# Patient Record
Sex: Female | Born: 1990 | Race: Black or African American | Hispanic: No | Marital: Single | Smoking: Current every day smoker
Health system: Southern US, Community
[De-identification: ages and names within clinical notes are randomized; demographics above are authoritative.]

## PROBLEM LIST (undated history)

## (undated) ENCOUNTER — Inpatient Hospital Stay (HOSPITAL_COMMUNITY): Payer: Self-pay

## (undated) DIAGNOSIS — R51 Headache: Secondary | ICD-10-CM

## (undated) DIAGNOSIS — K219 Gastro-esophageal reflux disease without esophagitis: Secondary | ICD-10-CM

## (undated) DIAGNOSIS — G35 Multiple sclerosis: Secondary | ICD-10-CM

## (undated) DIAGNOSIS — R519 Headache, unspecified: Secondary | ICD-10-CM

## (undated) HISTORY — PX: ADENOIDECTOMY: SUR15

## (undated) HISTORY — PX: TONSILLECTOMY: SUR1361

---
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
## 2011-02-17 LAB — SYPHILIS: RPR W/REFLEX TO RPR TITER AND TREPONEMAL ANTIBODIES, TRADITIONAL SCREENING AND DIAGNOSIS ALGORITHM: RPR Ser Ql: NONREACTIVE

## 2011-02-17 LAB — CBC
HCT: 29.4 % — ABNORMAL LOW (ref 36.0–46.0)
Hemoglobin: 10 g/dL — ABNORMAL LOW (ref 12.0–15.0)
MCHC: 33.9 g/dL (ref 30.0–36.0)
MCV: 90.3 fL (ref 78.0–100.0)
MCV: 91.4 fL (ref 78.0–100.0)
Platelets: 204 10*3/uL (ref 150–400)
Platelets: 256 10*3/uL (ref 150–400)
RBC: 3.22 MIL/uL — ABNORMAL LOW (ref 3.87–5.11)
RBC: 3.71 MIL/uL — ABNORMAL LOW (ref 3.87–5.11)
RDW: 13.3 % (ref 11.5–15.5)
WBC: 15.1 10*3/uL — ABNORMAL HIGH (ref 4.0–10.5)
WBC: 16 10*3/uL — ABNORMAL HIGH (ref 4.0–10.5)

## 2011-03-27 NOTE — Discharge Summary (Signed)
NAMESHARONDA, [name] NO.:  [id number]   MEDICAL RECORD NO.:  [id number]          PATIENT TYPE:  INP   LOCATION:  9127                          FACILITY:  [facility]   PHYSICIAN:  Freddy Finner, M.D.   DATE OF BIRTH:  1991-05-02   DATE OF ADMISSION:  07/11/2009  DATE OF DISCHARGE:  07/14/2009                               DISCHARGE SUMMARY   ADMITTING DIAGNOSES:  1. Intrauterine pregnancy at 37-6/7 weeks estimated gestational age.  2. Spontaneous onset of labor.   DISCHARGE DIAGNOSES:  1. Status post low transverse cesarean section secondary to      nonreassuring fetal heart tones.  2. Viable female infant.   PROCEDURE:  Primary low transverse cesarean section.   REASON FOR ADMISSION:  Please see written H and P.   HOSPITAL COURSE:  The patient is an 20 year old African American single  black female, primigravida, who was admitted to [facility] with spontaneous onset of labor at 37-6/7 weeks estimated  gestational age.  Artificial rupture of membranes was performed which  revealed clear fluid.  Fetal heart tones were in the 160s with  acceleration with a questionable deceleration.  Intrauterine pressure  catheter was inserted and fetal scalp electrode was placed.  Initially,  the fetal heart tones were reactive; however, shortly thereafter fetal  heart tones were noted to have some decelerations down into the 50s with  contractions.  There was some recovery in between.  Cervix was  reexamined and found to be unchanged.  Decision was changed.  IV fluids  given.  Oxygen administration was administered.  Due to persistent fetal  heart rate decelerations and failure to progress, decision was made to  proceed with a primary low transverse cesarean section.  The patient was  now taken to the operating room where spinal anesthesia was administered  without difficulty.  A low transverse incision was made with delivery of  a viable female infant with Apgars  of 6 at 1 minute and 9 at 5 minutes.  Arterial cord pH was 7.18.  The patient tolerated the procedure well and  was taken to the recovery room in stable condition.  On postoperative  day #1, the patient was without complaint.  Vital signs were stable.  She was afebrile.  Abdomen was slightly distended with some decrease in  bowel sounds.  Fundus was firm, nontender.  Abdominal dressing to have a  small amount of drainage noted on the bandage.  Foley had been  discontinued and she is voiding well.  Laboratory findings showed  hemoglobin of 10.0, platelet count of 204,000, blood type was known to  be O+.  On postoperative day #2, the patient was without complaint.  Vital signs were stable.  She is afebrile.  Fundus firm and nontender.  Incision was clean, dry and intact.  She is ambulating well.  On  postoperative day 3, the patient was without complaint.  Vital signs  remained stable.  She was afebrile.  Fundus firm and nontender.  Incision was clean, dry and intact.  Staples removed.  Discharge  instructions were reviewed and the patient  was later discharged home.   CONDITION ON DISCHARGE:  Stable.   DIET:  Regular as tolerated.   ACTIVITY:  No heavy lifting, no driving x2 weeks, no vaginal entry.   FOLLOW UP:  The patient is to follow up in the office in 1-2 weeks for  an incision check.  She is to call for temperature greater than 100  degrees, persistent nausea, vomiting, heavy vaginal bleeding and/or  redness or drainage from the incisional site.   DISCHARGE MEDICATIONS:  1. Tylox #30 one p.o. every 4-6 hours p.r.n.  2. Motrin 600 mg every 6 hours.  3. Prenatal vitamins 1 p.o. daily.      Julio Sicks, N.P.      Freddy Finner, M.D.  Electronically Signed    CC/MEDQ  D:  07/14/2009  T:  07/14/2009  Job:  [id number]

## 2011-03-27 NOTE — Op Note (Signed)
[name], [name]             ACCOUNT NO.:  [id number]   MEDICAL RECORD NO.:  [id number]          PATIENT TYPE:  INP   LOCATION:  9127                          FACILITY:  [facility]   PHYSICIAN:  Guy Sandifer. Henderson Cloud, M.D. DATE OF BIRTH:  Jun 29, 1991   DATE OF PROCEDURE:  07/11/2009  DATE OF DISCHARGE:                               OPERATIVE REPORT   PREOPERATIVE DIAGNOSES:  1. Intrauterine pregnancy at 37-6/7 weeks estimated gestational age.  2. Nonreassuring fetal heart tones.   POSTOPERATIVE DIAGNOSES:  1. Intrauterine pregnancy at 37-6/7 weeks estimated gestational age.  2. Nonreassuring fetal heart tones.   PROCEDURE:  Low-transverse cesarean section.   SURGEON:  Guy Sandifer. Henderson Cloud, MD   ANESTHESIA:  Spinal, Dr. Rodman Pickle.   SPECIMENS:  Placenta to pathology.   FINDINGS:  Viable female infant, Apgars of 6 and 9 at 1 and 5 minutes  respectively.  Arterial cord pH 7.18.   ESTIMATED BLOOD LOSS:  500 mL.   INDICATIONS AND CONSENT:  This patient is an 20 year old single black  female G1, P0 with an EDC of July 27, 2009.  Prenatal care has been  uncomplicated and group B strep culture is negative.  She presented to  hospital earlier in the day with contractions, but did not achieve  active labor and had a reactive fetal heart tracing.  She was discharged  home.  She returns with complaints of contractions.  At approximately  4:35 a.m., cervix was 7, complete, -1, vertex station.  Artificial  rupture of membranes for clear fluid is carried out.  Fetal heart tones  were 160s with excels and a question of some decels.  IUPC and fetal  scalp electrode were placed.  Fetal heart tones were reactive.  The  fetal heart tones then began having decelerations to the 50s with  contractions.  There was recovery in-between.  Repeat cervical exam  revealed it to be unchanged.  Position change, IV fluids and oxygen was  administered and discontinued.  Potential emergent cesarean section was  discussed while we were watching this with the patient's father of the  baby and the patient's mother.  Potential risks and complications were  discussed preoperatively as well including infection, organ damage, and  bleeding requiring transfusion of blood products with HIV and hepatitis  acquisition.  All questions were answered.  The patient's cervix  remained unchanged.  The deep decelerations continued and immediate  cesarean section was recommended.   PROCEDURE IN DETAILS:  The patient was rapidly taken to the operating  room.  While there the fetal heartbeat was in the 150s with  decelerations noted with contractions.  The patient undergoes spinal  anesthetic with Dr. Rodman Pickle.  She was placed in a dorsosupine position  with 15-degree left lateral wedge.  She was prepped.  Foley catheter was  placed in the bladder to drain.  She was draped in a sterile fashion.  After testing for adequate spinal anesthesia, skin was entered through a  Pfannenstiel incision and dissection was carried out in layers to the  peritoneum.  The peritoneum was incised and extended bluntly.  The lower  uterine segment contains a contracted ring consistent with a Bandl ring.  The vesicouterine peritoneum was taken down cephalad laterally.  The  bladder flap was developed and the bladder blade was placed.  Uterus was  incised in a low transverse manner and the uterine cavity was entered  bluntly with a hemostat.  Uterine incision was extended cephalad  laterally with fingers.  Baby was in the occiput posterior position.  The vertex was delivered.  The oronasopharynx were suctioned.  The  baby's left shoulder delivers easily, but the right shoulder is caught  in the Bandl ring.  No excessive traction was used.  Using good  retraction with good visualization and a finger below the uterus, the  uterine incision was extended on the superior aspect on the left side of  the incision for approximately 2 cm.  This  allows mobilization of the  baby's right shoulder without difficulty and the baby was delivered.  Oronasopharynx were again suctioned.  Cord was clamped and cut.  The  baby was handed to the awaiting pediatrics team.  Placenta was manually  delivered and the uterine cavity was clean.  Uterus was exteriorized.  The extension was closed with a running locking 0 Monocryl suture.  The  remainder of the incision was closed with 2 running locking imbricating  layers of 0 Monocryl suture.  There was a small amount of bleeding at  the left angle of the incision.  With a hand behind the lower uterine  segment and the broad ligament to protect other structures, this was  controlled with figure-of-eights of 0 Monocryl.  Tubes and ovaries were  normal.  Uterus was returned to the abdomen.  Copious irrigation was  carried out and all returns was clear.  The anterior peritoneum was  closed in a running fashion with 0 Monocryl suture, which was also used  to reapproximate the pyramidalis muscle in the midline.  Rectus fascia  was closed in a running fashion with a looped 0 PDS suture.  Skin was  closed with clips.  All sponge, instrument, and needle counts were  correct and the patient was transferred to the recovery room in stable  condition.       Guy Sandifer Henderson Cloud, M.D.  Electronically Signed     JET/MEDQ  D:  07/11/2009  T:  07/11/2009  Job:  [id number]

## 2012-03-10 ENCOUNTER — Encounter (HOSPITAL_COMMUNITY): Payer: Self-pay | Admitting: *Deleted

## 2012-03-10 ENCOUNTER — Emergency Department (HOSPITAL_COMMUNITY)
Admission: EM | Admit: 2012-03-10 | Discharge: 2012-03-10 | Disposition: A | Discharge status: Home / Self Care | Payer: Self-pay | Attending: Emergency Medicine | Admitting: Emergency Medicine

## 2012-03-10 DIAGNOSIS — J029 Acute pharyngitis, unspecified: Secondary | ICD-10-CM | POA: Insufficient documentation

## 2012-03-10 DIAGNOSIS — J069 Acute upper respiratory infection, unspecified: Secondary | ICD-10-CM | POA: Insufficient documentation

## 2012-03-10 MED ORDER — MAGIC MOUTHWASH W/LIDOCAINE: 5.0000 mL | Freq: Three times a day (TID) | ORAL | Status: AC | PRN

## 2012-03-10 MED ORDER — MAGIC MOUTHWASH
5.0000 mL | Freq: Once | ORAL | Status: AC
  Administered 2012-03-10: 5 mL via ORAL
  Filled 2012-03-10: qty 5

## 2012-03-10 NOTE — Discharge Instructions (Signed)
Upper Respiratory Infection, Adult An upper respiratory infection (URI) is also sometimes known as the common cold. The upper respiratory tract includes the nose, sinuses, throat, trachea, and bronchi. Bronchi are the airways leading to the lungs. Most people improve within 1 week, but symptoms can last up to 2 weeks. A residual cough may last even longer.  CAUSES Many different viruses can infect the tissues lining the upper respiratory tract. The tissues become irritated and inflamed and often become very moist. Mucus production is also common. A cold is contagious. You can easily spread the virus to others by oral contact. This includes kissing, sharing a glass, coughing, or sneezing. Touching your mouth or nose and then touching a surface, which is then touched by another person, can also spread the virus. SYMPTOMS  Symptoms typically develop 1 to 3 days after you come in contact with a cold virus. Symptoms vary from person to person. They may include:  Runny nose.   Sneezing.   Nasal congestion.   Sinus irritation.   Sore throat.   Loss of voice (laryngitis).   Cough.   Fatigue.   Muscle aches.   Loss of appetite.   Headache.   Low-grade fever.  DIAGNOSIS  You might diagnose your own cold based on familiar symptoms, since most people get a cold 2 to 3 times a year. Your caregiver can confirm this based on your exam. Most importantly, your caregiver can check that your symptoms are not due to another disease such as strep throat, sinusitis, pneumonia, asthma, or epiglottitis. Blood tests, throat tests, and X-rays are not necessary to diagnose a common cold, but they may sometimes be helpful in excluding other more serious diseases. Your caregiver will decide if any further tests are required. RISKS AND COMPLICATIONS  You may be at risk for a more severe case of the common cold if you smoke cigarettes, have chronic heart disease (such as heart failure) or lung disease (such as  asthma), or if you have a weakened immune system. The very young and very old are also at risk for more serious infections. Bacterial sinusitis, middle ear infections, and bacterial pneumonia can complicate the common cold. The common cold can worsen asthma and chronic obstructive pulmonary disease (COPD). Sometimes, these complications can require emergency medical care and may be life-threatening. PREVENTION  The best way to protect against getting a cold is to practice good hygiene. Avoid oral or hand contact with people with cold symptoms. Wash your hands often if contact occurs. There is no clear evidence that vitamin C, vitamin E, echinacea, or exercise reduces the chance of developing a cold. However, it is always recommended to get plenty of rest and practice good nutrition. TREATMENT  Treatment is directed at relieving symptoms. There is no cure. Antibiotics are not effective, because the infection is caused by a virus, not by bacteria. Treatment may include:  Increased fluid intake. Sports drinks offer valuable electrolytes, sugars, and fluids.   Breathing heated mist or steam (vaporizer or shower).   Eating chicken soup or other clear broths, and maintaining good nutrition.   Getting plenty of rest.   Using gargles or lozenges for comfort.   Controlling fevers with ibuprofen or acetaminophen as directed by your caregiver.   Increasing usage of your inhaler if you have asthma.  Zinc gel and zinc lozenges, taken in the first 24 hours of the common cold, can shorten the duration and lessen the severity of symptoms. Pain medicines may help with fever, muscle   aches, and throat pain. A variety of non-prescription medicines are available to treat congestion and runny nose. Your caregiver can make recommendations and may suggest nasal or lung inhalers for other symptoms.  HOME CARE INSTRUCTIONS   Only take over-the-counter or prescription medicines for pain, discomfort, or fever as directed  by your caregiver.   Use a warm mist humidifier or inhale steam from a shower to increase air moisture. This may keep secretions moist and make it easier to breathe.   Drink enough water and fluids to keep your urine clear or pale yellow.   Rest as needed.   Return to work when your temperature has returned to normal or as your caregiver advises. You may need to stay home longer to avoid infecting others. You can also use a face mask and careful hand washing to prevent spread of the virus.  SEEK MEDICAL CARE IF:   After the first few days, you feel you are getting worse rather than better.   You need your caregiver's advice about medicines to control symptoms.   You develop chills, worsening shortness of breath, or brown or red sputum. These may be signs of pneumonia.   You develop yellow or brown nasal discharge or pain in the face, especially when you bend forward. These may be signs of sinusitis.   You develop a fever, swollen neck glands, pain with swallowing, or white areas in the back of your throat. These may be signs of strep throat.  SEEK IMMEDIATE MEDICAL CARE IF:   You have a fever.   You develop severe or persistent headache, ear pain, sinus pain, or chest pain.   You develop wheezing, a prolonged cough, cough up blood, or have a change in your usual mucus (if you have chronic lung disease).   You develop sore muscles or a stiff neck.  Document Released: 04/24/2001 Document Revised: 10/18/2011 Document Reviewed: 03/02/2011 [facility] Patient Information 2012 [place], [place].  Sore Throat Sore throats may be caused by bacteria and viruses. They may also be caused by:  Smoking.   Pollution.   Allergies.  If a sore throat is due to strep infection (a bacterial infection), you may need:  A throat swab.   A culture test to verify the strep infection.  You will need one of these:  An antibiotic shot.   Oral medicine for a full 10 days.  Strep infection is very  contagious. A doctor should check any close contacts who have a sore throat or fever. A sore throat caused by a virus infection will usually last only 3-4 days. Antibiotics will not treat a viral sore throat.  Infectious mononucleosis (a viral disease), however, can cause a sore throat that lasts for up to 3 weeks. Mononucleosis can be diagnosed with blood tests. You must have been sick for at least 1 week in order for the test to give accurate results. HOME CARE INSTRUCTIONS   To treat a sore throat, take mild pain medicine.   Increase your fluids.   Eat a soft diet.   Do not smoke.   Gargling with warm water or salt water (1 tsp. salt in 8 oz. water) can be helpful.   Try throat sprays or lozenges or sucking on hard candy to ease the symptoms.  Call your doctor if your sore throat lasts longer than 1 week.  SEEK IMMEDIATE MEDICAL CARE IF:  You have difficulty breathing.   You have increased swelling in the throat.   You have pain so  severe that you are unable to swallow fluids or your saliva.   You have a severe headache, a high fever, vomiting, or a red rash.  Document Released: 12/06/2004 Document Revised: 10/18/2011 Document Reviewed: 10/16/2007 [facility] Patient Information 2012 [place], [place].

## 2012-03-10 NOTE — ED Notes (Signed)
STREP SCREEN COLLECTED BY BOWIE TRAN (PA)

## 2012-03-10 NOTE — ED Provider Notes (Signed)
History     CSN: [id number]  Arrival date & time 03/10/12  1450   First MD Initiated Contact with Patient 03/10/12 1506      No chief complaint on file.   (Consider location/radiation/quality/duration/timing/severity/associated sxs/prior treatment) HPI  21 year old female presents with chief complaints of sore throat. Patient states since yesterday she has been experiencing sore throat. Describe symptoms/onset, persistent, worsening with swallowing. She is experiencing fevers and chills, and right earache. She denies headache, runny nose, sneezing, coughing, neck pain, abdominal pain, nausea, vomiting, diarrhea, or rash. She has tried over-the-counter medication include Cepacol, ibuprofen, and DayQuil without relief.  Patient denies voice changes, or trouble breathing. She is up-to-date with all of her immunizations. Denies any recent sick contact  No past medical history on file.  No past surgical history on file.  No family history on file.  History  Substance Use Topics  . Smoking status: Not on file  . Smokeless tobacco: Not on file  . Alcohol Use: Not on file    OB History    No data available      Review of Systems  All other systems reviewed and are negative.    Allergies  Review of patient's allergies indicates no known allergies.  Home Medications   Current Outpatient Rx  Name Route Sig Dispense Refill  . IBUPROFEN 400 MG PO TABS Oral Take 400 mg by mouth every 6 (six) hours as needed. For pain relief    . MENTHOL 3 MG MT LOZG Oral Take 1 lozenge by mouth as needed. For sore throat    . ADULT MULTIVITAMIN W/MINERALS CH Oral Take 1 tablet by mouth daily.    Marland Kitchen PSEUDOEPHEDRINE-APAP-DM 84-696-29 MG/30ML PO LIQD Oral Take 30 mLs by mouth every 8 (eight) hours as needed. For cold flu symptom relief    . VITAMIN C 500 MG PO TABS Oral Take 500 mg by mouth 2 (two) times daily.      There were no vitals taken for this visit.  Physical Exam  Nursing note and  vitals reviewed. Constitutional: She is oriented to person, place, and time. She appears well-developed and well-nourished. No distress.  HENT:  Head: Normocephalic and atraumatic.  Right Ear: External ear normal.  Left Ear: External ear normal.  Mouth/Throat: Oropharynx is clear and moist. No oropharyngeal exudate.       No tonsilar enlargement, exudates, Ludwig's angina, or PTA.  No voice changes.  Eyes: Conjunctivae are normal. No scleral icterus.  Neck: Normal range of motion. Neck supple.  Abdominal: Soft. There is no tenderness.       No splenomegaly  Lymphadenopathy:    She has no cervical adenopathy.  Neurological: She is alert and oriented to person, place, and time.  Skin: Skin is warm. No rash noted.  Psychiatric: She has a normal mood and affect.    ED Course  Procedures (including critical care time)  Labs Reviewed - No data to display No results found.   No diagnosis found.  Results for orders placed during the hospital encounter of 03/10/12  RAPID STREP SCREEN      Component Value Range   Streptococcus, Group A Screen (Direct) NEGATIVE  NEGATIVE    No results found.    MDM  Sore throat and ear pain, suggestive of viral URI.  Rapid strep test acquired.  Magic mouthwash given.    3:56 PM Strep test is negative. Patient is afebrile. Patient notice improvement with patient mouthwash. Care Instruction given.  Fayrene Helper, PA-C 03/10/12 1600

## 2012-03-10 NOTE — ED Notes (Signed)
Pt in c/o sore throat since yesterday, denies other symptoms

## 2012-03-11 NOTE — ED Provider Notes (Signed)
Medical screening examination/treatment/procedure(s) were performed by non-physician practitioner and as supervising physician I was immediately available for consultation/collaboration.   Sariya Trickey, MD 03/11/12 0011 

## 2014-04-24 ENCOUNTER — Encounter (HOSPITAL_COMMUNITY): Payer: Self-pay | Admitting: *Deleted

## 2014-04-24 ENCOUNTER — Inpatient Hospital Stay (HOSPITAL_COMMUNITY)
Admission: AD | Admit: 2014-04-24 | Discharge: 2014-04-24 | Disposition: A | Discharge status: Home / Self Care | Payer: Self-pay | Source: Ambulatory Visit | Attending: Obstetrics and Gynecology | Admitting: Obstetrics and Gynecology

## 2014-04-24 DIAGNOSIS — K5289 Other specified noninfective gastroenteritis and colitis: Secondary | ICD-10-CM | POA: Insufficient documentation

## 2014-04-24 DIAGNOSIS — F17200 Nicotine dependence, unspecified, uncomplicated: Secondary | ICD-10-CM | POA: Insufficient documentation

## 2014-04-24 DIAGNOSIS — K529 Noninfective gastroenteritis and colitis, unspecified: Secondary | ICD-10-CM

## 2014-04-24 LAB — URINE MICROSCOPIC-ADD ON

## 2014-04-24 LAB — URINALYSIS, ROUTINE W REFLEX MICROSCOPIC
BILIRUBIN URINE: NEGATIVE
Glucose, UA: NEGATIVE mg/dL
KETONES UR: NEGATIVE mg/dL
Leukocytes, UA: NEGATIVE
NITRITE: NEGATIVE
PH: 6 (ref 5.0–8.0)
Protein, ur: NEGATIVE mg/dL
Specific Gravity, Urine: >1.03 — ABNORMAL HIGH (ref 1.005–1.030)
UROBILINOGEN UA: 0.2 mg/dL (ref 0.0–1.0)

## 2014-04-24 LAB — POCT PREGNANCY, URINE: Preg Test, Ur: NEGATIVE

## 2014-04-24 MED ORDER — PROMETHAZINE HCL 25 MG PO TABS: 25.0000 mg | ORAL_TABLET | Freq: Four times a day (QID) | ORAL | Status: DC | PRN

## 2014-04-24 NOTE — MAU Note (Signed)
Nauseated x 2 days, vomiting after eating.  Unsure if pregnant, LMP 5/15, denies bleeding.

## 2014-04-24 NOTE — MAU Provider Note (Signed)
History     CSN: [id number]  Arrival date and time: 04/24/14 1130   None     Chief Complaint  Patient presents with  . Emesis   HPI 23 y.o. G2P1011 with n/v x 2 days, fatigue, no fever, chills, diarrhea. Unsure if pregnant. Patient's last menstrual period was 03/26/2014.   Past Medical History  Diagnosis Date  . Medical history non-contributory     Past Surgical History  Procedure Laterality Date  . Tonsillectomy    . Adenoidectomy    . Cesarean section      History reviewed. No pertinent family history.  History  Substance Use Topics  . Smoking status: Current Every Day Smoker    Types: Cigarettes  . Smokeless tobacco: Not on file  . Alcohol Use: Yes     Comment: occasional    Allergies: No Known Allergies  No prescriptions prior to admission    Review of Systems  Constitutional: Negative.   Respiratory: Negative.   Cardiovascular: Negative.   Gastrointestinal: Negative for nausea, vomiting, abdominal pain, diarrhea and constipation.  Genitourinary: Negative for dysuria, urgency, frequency, hematuria and flank pain.       Negative for vaginal bleeding, discharge   Musculoskeletal: Negative.   Neurological: Negative.   Psychiatric/Behavioral: Negative.    Physical Exam   Blood pressure 128/76, pulse 88, temperature 98.7 F (37.1 C), temperature source Oral, resp. rate 18, height 5\' 6"  (1.676 m), weight 131 lb 9.6 oz (59.693 kg), last menstrual period 03/26/2014.  Physical Exam  Nursing note and vitals reviewed. Constitutional: She is oriented to person, place, and time. She appears well-developed and well-nourished. No distress.  Cardiovascular: Normal rate.   Respiratory: Effort normal.  Musculoskeletal: Normal range of motion.  Neurological: She is alert and oriented to person, place, and time.  Skin: Skin is warm.  Psychiatric: She has a normal mood and affect.    MAU Course  Procedures Results for orders placed during the hospital  encounter of 04/24/14 (from the past 24 hour(s))  URINALYSIS, ROUTINE W REFLEX MICROSCOPIC     Status: Abnormal   Collection Time    04/24/14 11:46 AM      Result Value Ref Range   Color, Urine YELLOW  YELLOW   APPearance CLEAR  CLEAR   Specific Gravity, Urine >1.030 (*) 1.005 - 1.030   pH 6.0  5.0 - 8.0   Glucose, UA NEGATIVE  NEGATIVE mg/dL   Hgb urine dipstick SMALL (*) NEGATIVE   Bilirubin Urine NEGATIVE  NEGATIVE   Ketones, ur NEGATIVE  NEGATIVE mg/dL   Protein, ur NEGATIVE  NEGATIVE mg/dL   Urobilinogen, UA 0.2  0.0 - 1.0 mg/dL   Nitrite NEGATIVE  NEGATIVE   Leukocytes, UA NEGATIVE  NEGATIVE  URINE MICROSCOPIC-ADD ON     Status: Abnormal   Collection Time    04/24/14 11:46 AM      Result Value Ref Range   Squamous Epithelial / LPF FEW (*) RARE   RBC / HPF 3-6  <3 RBC/hpf   Bacteria, UA RARE  RARE   Urine-Other MUCOUS PRESENT    POCT PREGNANCY, URINE     Status: None   Collection Time    04/24/14 11:54 AM      Result Value Ref Range   Preg Test, Ur NEGATIVE  NEGATIVE     Assessment and Plan   1. Acute gastroenteritis   Clear liquids, rx phenergan, f/u in Urgent Care or w/ PCP if sx continue    Medication List  DAYQUIL MULTI-SYMPTOM 60-650-20 MG/30ML Liqd  Generic drug:  Pseudoephedrine-APAP-DM  Take 30 mLs by mouth every 8 (eight) hours as needed. For cold flu symptom relief     ibuprofen 400 MG tablet  Commonly known as:  ADVIL,MOTRIN  Take 400 mg by mouth every 6 (six) hours as needed. For pain relief     menthol-cetylpyridinium 3 MG lozenge  Commonly known as:  CEPACOL  Take 1 lozenge by mouth as needed. For sore throat     multivitamin with minerals Tabs tablet  Take 1 tablet by mouth daily.     promethazine 25 MG tablet  Commonly known as:  PHENERGAN  Take 1 tablet (25 mg total) by mouth every 6 (six) hours as needed for nausea or vomiting.     vitamin C 500 MG tablet  Commonly known as:  ASCORBIC ACID  Take 500 mg by mouth 2 (two) times  daily.            Follow-up Information   Follow up with [facility]. (As needed, If symptoms worsen)    Contact information:   [address] [place] [address]         Georges Mouse 04/24/2014, 2:55 PM

## 2014-06-24 ENCOUNTER — Inpatient Hospital Stay (HOSPITAL_COMMUNITY): Payer: Medicaid Other

## 2014-06-24 ENCOUNTER — Encounter (HOSPITAL_COMMUNITY): Payer: Self-pay | Admitting: General Practice

## 2014-06-24 ENCOUNTER — Inpatient Hospital Stay (HOSPITAL_COMMUNITY)
Admission: AD | Admit: 2014-06-24 | Discharge: 2014-06-24 | Disposition: A | Discharge status: Home / Self Care | Payer: Medicaid Other | Source: Ambulatory Visit | Attending: Obstetrics & Gynecology | Admitting: Obstetrics & Gynecology

## 2014-06-24 DIAGNOSIS — O99330 Smoking (tobacco) complicating pregnancy, unspecified trimester: Secondary | ICD-10-CM | POA: Diagnosis not present

## 2014-06-24 DIAGNOSIS — O9989 Other specified diseases and conditions complicating pregnancy, childbirth and the puerperium: Secondary | ICD-10-CM

## 2014-06-24 DIAGNOSIS — R42 Dizziness and giddiness: Secondary | ICD-10-CM | POA: Diagnosis present

## 2014-06-24 DIAGNOSIS — R109 Unspecified abdominal pain: Secondary | ICD-10-CM

## 2014-06-24 DIAGNOSIS — R1012 Left upper quadrant pain: Secondary | ICD-10-CM | POA: Diagnosis not present

## 2014-06-24 DIAGNOSIS — O99891 Other specified diseases and conditions complicating pregnancy: Secondary | ICD-10-CM | POA: Insufficient documentation

## 2014-06-24 DIAGNOSIS — O26899 Other specified pregnancy related conditions, unspecified trimester: Secondary | ICD-10-CM

## 2014-06-24 LAB — URINALYSIS, ROUTINE W REFLEX MICROSCOPIC
Bilirubin Urine: NEGATIVE
GLUCOSE, UA: NEGATIVE mg/dL
Ketones, ur: NEGATIVE mg/dL
LEUKOCYTES UA: NEGATIVE
NITRITE: NEGATIVE
PROTEIN: NEGATIVE mg/dL
Specific Gravity, Urine: 1.01 (ref 1.005–1.030)
Urobilinogen, UA: 0.2 mg/dL (ref 0.0–1.0)
pH: 6.5 (ref 5.0–8.0)

## 2014-06-24 LAB — ABO/RH: ABO/RH(D): O POS

## 2014-06-24 LAB — HCG, QUANTITATIVE, PREGNANCY: hCG, Beta Chain, Quant, S: 113 m[IU]/mL — ABNORMAL HIGH (ref <5)

## 2014-06-24 LAB — CBC
HCT: 34.9 % — ABNORMAL LOW (ref 36.0–46.0)
HEMOGLOBIN: 12.1 g/dL (ref 12.0–15.0)
MCH: 29 pg (ref 26.0–34.0)
MCHC: 34.7 g/dL (ref 30.0–36.0)
MCV: 83.7 fL (ref 78.0–100.0)
PLATELETS: 225 10*3/uL (ref 150–400)
RBC: 4.17 MIL/uL (ref 3.87–5.11)
RDW: 13 % (ref 11.5–15.5)
WBC: 6.4 10*3/uL (ref 4.0–10.5)

## 2014-06-24 LAB — WET PREP, GENITAL
TRICH WET PREP: NONE SEEN
Yeast Wet Prep HPF POC: NONE SEEN

## 2014-06-24 LAB — URINE MICROSCOPIC-ADD ON

## 2014-06-24 LAB — POCT PREGNANCY, URINE: PREG TEST UR: POSITIVE — AB

## 2014-06-24 MED ORDER — CONCEPT OB 130-92.4-1 MG PO CAPS: 1.0000 | ORAL_CAPSULE | Freq: Every day | ORAL | Status: DC

## 2014-06-24 NOTE — MAU Note (Signed)
Was at work , feeling hot/cold, like she was going to faint. Vision started blurring.   Improved when sat down, those feeling stopped except for the hot/cold changes and her heart was racing.  Has had 4+HPT (Sunday)

## 2014-06-24 NOTE — Discharge Instructions (Signed)
Abdominal Pain During Pregnancy °Abdominal pain is common in pregnancy. Most of the time, it does not cause harm. There are many causes of abdominal pain. Some causes are more serious than others. Some of the causes of abdominal pain in pregnancy are easily diagnosed. Occasionally, the diagnosis takes time to understand. Other times, the cause is not determined. Abdominal pain can be a sign that something is very wrong with the pregnancy, or the pain may have nothing to do with the pregnancy at all. For this reason, always tell your health care provider if you have any abdominal discomfort. °HOME CARE INSTRUCTIONS  °Monitor your abdominal pain for any changes. The following actions may help to alleviate any discomfort you are experiencing: °· Do not have sexual intercourse or put anything in your vagina until your symptoms go away completely. °· Get plenty of rest until your pain improves. °· Drink clear fluids if you feel nauseous. Avoid solid food as long as you are uncomfortable or nauseous. °· Only take over-the-counter or prescription medicine as directed by your health care provider. °· Keep all follow-up appointments with your health care provider. °SEEK IMMEDIATE MEDICAL CARE IF: °· You are bleeding, leaking fluid, or passing tissue from the vagina. °· You have increasing pain or cramping. °· You have persistent vomiting. °· You have painful or bloody urination. °· You have a fever. °· You notice a decrease in your baby's movements. °· You have extreme weakness or feel faint. °· You have shortness of breath, with or without abdominal pain. °· You develop a severe headache with abdominal pain. °· You have abnormal vaginal discharge with abdominal pain. °· You have persistent diarrhea. °· You have abdominal pain that continues even after rest, or gets worse. °MAKE SURE YOU:  °· Understand these instructions. °· Will watch your condition. °· Will get help right away if you are not doing well or get  worse. °Document Released: 10/29/2005 Document Revised: 08/19/2013 Document Reviewed: 05/28/2013 °ExitCare® Patient Information ©2015 [employer]. This information is not intended to replace advice given to you by your health care provider. Make sure you discuss any questions you have with your health care provider. °Dizziness °Dizziness is a common problem. It is a feeling of unsteadiness or light-headedness. You may feel like you are about to faint. Dizziness can lead to injury if you stumble or fall. A person of any age group can suffer from dizziness, but dizziness is more common in older adults. °CAUSES  °Dizziness can be caused by many different things, including: °· Middle ear problems. °· Standing for too long. °· Infections. °· An allergic reaction. °· Aging. °· An emotional response to something, such as the sight of blood. °· Side effects of medicines. °· Tiredness. °· Problems with circulation or blood pressure. °· Excessive use of alcohol or medicines, or illegal drug use. °· Breathing too fast (hyperventilation). °· An irregular heart rhythm (arrhythmia). °· A low red blood cell count (anemia). °· Pregnancy. °· Vomiting, diarrhea, fever, or other illnesses that cause body fluid loss (dehydration). °· Diseases or conditions such as Parkinson's disease, high blood pressure (hypertension), diabetes, and thyroid problems. °· Exposure to extreme heat. °DIAGNOSIS  °Your health care provider will ask about your symptoms, perform a physical exam, and perform an electrocardiogram (ECG) to record the electrical activity of your heart. Your health care provider may also perform other heart or blood tests to determine the cause of your dizziness. These may include: °· Transthoracic echocardiogram (TTE). During echocardiography, sound   waves are used to evaluate how blood flows through your heart. °· Transesophageal echocardiogram (TEE). °· Cardiac monitoring. This allows your health care provider to monitor your  heart rate and rhythm in real time. °· Holter monitor. This is a portable device that records your heartbeat and can help diagnose heart arrhythmias. It allows your health care provider to track your heart activity for several days if needed. °· Stress tests by exercise or by giving medicine that makes the heart beat faster. °TREATMENT  °Treatment of dizziness depends on the cause of your symptoms and can vary greatly. °HOME CARE INSTRUCTIONS  °· Drink enough fluids to keep your urine clear or pale yellow. This is especially important in very hot weather. In older adults, it is also important in cold weather. °· Take your medicine exactly as directed if your dizziness is caused by medicines. When taking blood pressure medicines, it is especially important to get up slowly. °¨ Rise slowly from chairs and steady yourself until you feel okay. °¨ In the morning, first sit up on the side of the bed. When you feel okay, stand slowly while holding onto something until you know your balance is fine. °· Move your legs often if you need to stand in one place for a long time. Tighten and relax your muscles in your legs while standing. °· Have someone stay with you for 1-2 days if dizziness continues to be a problem. Do this until you feel you are well enough to stay alone. Have the person call your health care provider if he or she notices changes in you that are concerning. °· Do not drive or use heavy machinery if you feel dizzy. °· Do not drink alcohol. °SEEK IMMEDIATE MEDICAL CARE IF:  °· Your dizziness or light-headedness gets worse. °· You feel nauseous or vomit. °· You have problems talking, walking, or using your arms, hands, or legs. °· You feel weak. °· You are not thinking clearly or you have trouble forming sentences. It may take a friend or family member to notice this. °· You have chest pain, abdominal pain, shortness of breath, or sweating. °· Your vision changes. °· You notice any bleeding. °· You have side  effects from medicine that seems to be getting worse rather than better. °MAKE SURE YOU:  °· Understand these instructions. °· Will watch your condition. °· Will get help right away if you are not doing well or get worse. °Document Released: 04/24/2001 Document Revised: 11/03/2013 Document Reviewed: 05/18/2011 °ExitCare® Patient Information ©2015 [employer]. This information is not intended to replace advice given to you by your health care provider. Make sure you discuss any questions you have with your health care provider. ° °

## 2014-06-24 NOTE — MAU Provider Note (Signed)
Chief Complaint: Dizziness and Possible Pregnancy  First Provider Initiated Contact with Patient 06/24/14 1428     SUBJECTIVE HPI: [name] is a 23 y.o. G3P1011 at 6172w2d by LMP who presents with dizziness upon standing in hot/cold flashes X several days, right lower quadrant cramping 3 days ago that resolve spontaneously and left upper quadrant pain yesterday that resolve spontaneously. Positive UPT 4 days ago. No vaginal bleeding. No testing this pregnancy so far. States dizziness feels like what she has had with previous pregnancy.  Past Medical History  Diagnosis Date  . Medical history non-contributory    OB History  Gravida Para Term Preterm AB SAB TAB Ectopic Multiple Living  3 1 1  1  1   1     # Outcome Date GA Lbr Len/2nd Weight Sex Delivery Anes PTL Lv  3 CUR           2 TAB           1 TRM              Past Surgical History  Procedure Laterality Date  . Tonsillectomy    . Adenoidectomy    . Cesarean section     History   Social History  . Marital Status: Single    Spouse Name: N/A    Number of Children: N/A  . Years of Education: N/A   Occupational History  . Not on file.   Social History Main Topics  . Smoking status: Current Every Day Smoker    Types: Cigarettes  . Smokeless tobacco: Not on file  . Alcohol Use: Yes     Comment: occasional  . Drug Use: No  . Sexual Activity: Yes    Birth Control/ Protection: None   Other Topics Concern  . Not on file   Social History Narrative  . No narrative on file   No current facility-administered medications on file prior to encounter.   No current outpatient prescriptions on file prior to encounter.   No Known Allergies  ROS: Positive for abdominal pain, dizziness, hot flashes, tachycardia with dizziness.. Negative for fever, chills, vaginal bleeding, vaginal discharge, urinary complaints, GI complaints, chest pain or palpitations. No abdominal pain now.  OBJECTIVE Blood pressure 127/67, pulse  90, temperature 98.7 F (37.1 C), temperature source Oral, resp. rate 16, height 5' 3.5" (1.613 m), weight 60.782 kg (134 lb), last menstrual period 05/25/2014. GENERAL: Well-developed, well-nourished female in no acute distress. Normal color for race.  HEENT: Normocephalic HEART: normal rate RESP: normal effort ABDOMEN: Soft, non-tender positive bowel sounds x4. No CVA tenderness. EXTREMITIES: Nontender, no edema NEURO: Alert and oriented SPECULUM EXAM: NEFG, physiologic discharge, no blood noted, cervix clean BIMANUAL: cervix closed; uterus normal size, no adnexal tenderness or masses. No cervical motion tenderness.  LAB RESULTS Results for orders placed during the hospital encounter of 06/24/14 (from the past 24 hour(s))  POCT PREGNANCY, URINE     Status: Abnormal   Collection Time    06/24/14  1:57 PM      Result Value Ref Range   Preg Test, Ur POSITIVE (*) NEGATIVE  URINALYSIS, ROUTINE W REFLEX MICROSCOPIC     Status: Abnormal   Collection Time    06/24/14  2:00 PM      Result Value Ref Range   Color, Urine YELLOW  YELLOW   APPearance CLEAR  CLEAR   Specific Gravity, Urine 1.010  1.005 - 1.030   pH 6.5  5.0 - 8.0   Glucose, UA  NEGATIVE  NEGATIVE mg/dL   Hgb urine dipstick TRACE (*) NEGATIVE   Bilirubin Urine NEGATIVE  NEGATIVE   Ketones, ur NEGATIVE  NEGATIVE mg/dL   Protein, ur NEGATIVE  NEGATIVE mg/dL   Urobilinogen, UA 0.2  0.0 - 1.0 mg/dL   Nitrite NEGATIVE  NEGATIVE   Leukocytes, UA NEGATIVE  NEGATIVE  URINE MICROSCOPIC-ADD ON     Status: Abnormal   Collection Time    06/24/14  2:00 PM      Result Value Ref Range   Squamous Epithelial / LPF FEW (*) RARE   WBC, UA 0-2  <3 WBC/hpf   RBC / HPF 0-2  <3 RBC/hpf  HCG, QUANTITATIVE, PREGNANCY     Status: Abnormal   Collection Time    06/24/14  3:00 PM      Result Value Ref Range   hCG, Beta Chain, Quant, S 113 (*) <5 mIU/mL  ABO/RH     Status: None   Collection Time    06/24/14  3:00 PM      Result Value Ref Range    ABO/RH(D) O POS    CBC     Status: Abnormal   Collection Time    06/24/14  3:00 PM      Result Value Ref Range   WBC 6.4  4.0 - 10.5 K/uL   RBC 4.17  3.87 - 5.11 MIL/uL   Hemoglobin 12.1  12.0 - 15.0 g/dL   HCT 66.0 (*) 63.0 - 16.0 %   MCV 83.7  78.0 - 100.0 fL   MCH 29.0  26.0 - 34.0 pg   MCHC 34.7  30.0 - 36.0 g/dL   RDW 10.9  32.3 - 55.7 %   Platelets 225  150 - 400 K/uL  WET PREP, GENITAL     Status: Abnormal   Collection Time    06/24/14  3:11 PM      Result Value Ref Range   Yeast Wet Prep HPF POC NONE SEEN  NONE SEEN   Trich, Wet Prep NONE SEEN  NONE SEEN   Clue Cells Wet Prep HPF POC FEW (*) NONE SEEN   WBC, Wet Prep HPF POC FEW (*) NONE SEEN    IMAGING US Ob Comp Less 14 Wks  06/24/2014   CLINICAL DATA:  Abdominal pain. Quantitative beta HCG on 06/24/2014, 113.  EXAM: OBSTETRIC <14 WK [place] AND TRANSVAGINAL OB US  TECHNIQUE: Both transabdominal and transvaginal ultrasound examinations were performed for complete evaluation of the gestation as well as the maternal uterus, adnexal regions, and pelvic cul-de-sac. Transvaginal technique was performed to assess early pregnancy.  COMPARISON:  None.  FINDINGS: Intrauterine gestational sac: Tiny possible gestational sac is present in the endometrium.  Yolk sac:  None.  Embryo:  None.  Cardiac Activity: None.  Heart Rate:  Not applicable bpm  MSD:  2.2  mm   4 w   Five  d  [place] EDC: 02/26/2015.  Maternal uterus/adnexae: Physiologic appearance of the ovaries. No subchorionic hemorrhage. Small amount of free fluid in the anatomic pelvis. No adnexal mass.  IMPRESSION: Probable early intrauterine gestational sac, but no yolk sac, fetal pole, or cardiac activity yet visualized. Recommend follow-up quantitative B-HCG levels and follow-up US in 14 days to confirm and assess viability. This recommendation follows SRU consensus guidelines: Diagnostic Criteria for Nonviable Pregnancy Early in the First Trimester. Malva Limes Med 2013; 322:0254-27.    Electronically Signed   By: Andreas Newport M.D.   On: 06/24/2014 16:27  US Ob Transvaginal  06/24/2014   CLINICAL DATA:  Abdominal pain. Quantitative beta HCG on 06/24/2014, 113.  EXAM: OBSTETRIC <14 WK [place] AND TRANSVAGINAL OB US  TECHNIQUE: Both transabdominal and transvaginal ultrasound examinations were performed for complete evaluation of the gestation as well as the maternal uterus, adnexal regions, and pelvic cul-de-sac. Transvaginal technique was performed to assess early pregnancy.  COMPARISON:  None.  FINDINGS: Intrauterine gestational sac: Tiny possible gestational sac is present in the endometrium.  Yolk sac:  None.  Embryo:  None.  Cardiac Activity: None.  Heart Rate:  Not applicable bpm  MSD:  2.2  mm   4 w   Five  d  [place] EDC: 02/26/2015.  Maternal uterus/adnexae: Physiologic appearance of the ovaries. No subchorionic hemorrhage. Small amount of free fluid in the anatomic pelvis. No adnexal mass.  IMPRESSION: Probable early intrauterine gestational sac, but no yolk sac, fetal pole, or cardiac activity yet visualized. Recommend follow-up quantitative B-HCG levels and follow-up US in 14 days to confirm and assess viability. This recommendation follows SRU consensus guidelines: Diagnostic Criteria for Nonviable Pregnancy Early in the First Trimester. Malva Limes Med 2013; 161:0960-45.   Electronically Signed   By: Andreas Newport M.D.   On: 06/24/2014 16:27    MAU COURSE  ASSESSMENT 1. Abdominal pain affecting pregnancy, antepartum   2. Dizziness     PLAN Discharge home in stable condition. SAB and ectopic precautions. Followup Quant in 48 hours.     Follow-up Information   Follow up with [facility] [place] [facility] AD[facility] In 2 days. (fior repeat bloodwork or sooner As needed if symptoms worsen)    Contact information:   [address] [id number] [place] [address] [phone]       Medication List         CONCEPT OB 130-92.4-1 MG Caps  Take 1  tablet by mouth daily.       [place], CNM 06/24/2014  4:54 PM

## 2014-06-25 LAB — GC/CHLAMYDIA PROBE AMP
CT PROBE, AMP APTIMA: NEGATIVE
GC PROBE AMP APTIMA: NEGATIVE

## 2014-06-25 NOTE — MAU Provider Note (Signed)
Attestation of Attending Supervision of Advanced Practitioner (PA/CNM/NP): Evaluation and management procedures were performed by the Advanced Practitioner under my supervision and collaboration.  I have reviewed the Advanced Practitioner's note and chart, and I agree with the management and plan.  Arley Salamone, MD, FACOG Attending Obstetrician & Gynecologist Faculty Practice, [facility]   

## 2014-06-26 ENCOUNTER — Inpatient Hospital Stay (HOSPITAL_COMMUNITY)
Admission: AD | Admit: 2014-06-26 | Discharge: 2014-06-26 | Disposition: A | Discharge status: Home / Self Care | Payer: Medicaid Other | Source: Ambulatory Visit | Attending: Obstetrics and Gynecology | Admitting: Obstetrics and Gynecology

## 2014-06-26 DIAGNOSIS — O0281 Inappropriate change in quantitative human chorionic gonadotropin (hCG) in early pregnancy: Secondary | ICD-10-CM | POA: Insufficient documentation

## 2014-06-26 LAB — HCG, QUANTITATIVE, PREGNANCY: hCG, Beta Chain, Quant, S: 159 m[IU]/mL — ABNORMAL HIGH (ref <5)

## 2014-06-26 NOTE — MAU Provider Note (Signed)
Subjective:  [name] is a 23 y.o. female G3P1011 at [redacted]w[redacted]d who presents for a follow up beta hcg. She denies pain or bleeding. This is a very desired pregnancy. She is here today with her significant other.    Objective  GENERAL: Well-developed, well-nourished female in no acute distress.  HEENT: Normocephalic, atraumatic.   LUNGS: Effort normal HEART: Regular rate  SKIN: Warm, dry and without erythema PSYCH: Normal mood and affect  Filed Vitals:   06/26/14 1727  BP: 122/65  Pulse: 95  Temp: 99.9 F (37.7 C)  Resp: 18    CLINICAL DATA: Abdominal pain. Quantitative beta HCG on 06/24/2014,  113.  EXAM:  OBSTETRIC <14 WK [place] AND TRANSVAGINAL OB US  TECHNIQUE:  Both transabdominal and transvaginal ultrasound examinations were  performed for complete evaluation of the gestation as well as the  maternal uterus, adnexal regions, and pelvic cul-de-sac.  Transvaginal technique was performed to assess early pregnancy.  COMPARISON: None.  FINDINGS:  Intrauterine gestational sac: Tiny possible gestational sac is  present in the endometrium.  Yolk sac: None.  Embryo: None.  Cardiac Activity: None.  Heart Rate: Not applicable bpm  MSD: 2.2 mm 4 w Five d  [place] EDC: 02/26/2015.  Maternal uterus/adnexae: Physiologic appearance of the ovaries. No  subchorionic hemorrhage. Small amount of free fluid in the anatomic  pelvis. No adnexal mass.  IMPRESSION:  Probable early intrauterine gestational sac, but no yolk sac, fetal  pole, or cardiac activity yet visualized. Recommend follow-up  quantitative B-HCG levels and follow-up US in 14 days to confirm and  assess viability. This recommendation follows SRU consensus  guidelines: Diagnostic Criteria for Nonviable Pregnancy Early in the  First Trimester. [name] Med 2013; 440:3474-25.  Electronically Signed  By: Andreas Newport M.D.  On: 06/24/2014 16:27     Beta hcg levels: 8/13: 113 8/15: 159   MDM Beta  hcg   Assessment:  Inappropriate rise in beta hcg level  Probably early intrauterine gestational sac, no yolk sac on [place] 06/25/2015  Plan:  Discharge home in stable condition  Return to MAU in 48 hours  Ectopic precautions discussed at length Pelvic rest Return to MAU with any pain or bleeding    Gwendolyn Hansen Bethanee Redondo, NP 06/26/2014 6:23 PM

## 2014-06-26 NOTE — MAU Note (Signed)
Pt states here for repeat BHCG. Denies pain or bleeding. 

## 2014-06-27 NOTE — MAU Provider Note (Signed)
Attestation of Attending Supervision of Advanced Practitioner (CNM/NP): Evaluation and management procedures were performed by the Advanced Practitioner under my supervision and collaboration.  I have reviewed the Advanced Practitioner's note and chart, and I agree with the management and plan.  Robbie Rideaux 06/27/2014 7:58 AM   

## 2014-06-28 ENCOUNTER — Inpatient Hospital Stay (HOSPITAL_COMMUNITY)
Admission: AD | Admit: 2014-06-28 | Discharge: 2014-06-28 | Disposition: A | Discharge status: Home / Self Care | Payer: Medicaid Other | Source: Ambulatory Visit | Attending: Family Medicine | Admitting: Family Medicine

## 2014-06-28 DIAGNOSIS — O99891 Other specified diseases and conditions complicating pregnancy: Secondary | ICD-10-CM | POA: Diagnosis not present

## 2014-06-28 DIAGNOSIS — R109 Unspecified abdominal pain: Secondary | ICD-10-CM

## 2014-06-28 DIAGNOSIS — O99330 Smoking (tobacco) complicating pregnancy, unspecified trimester: Secondary | ICD-10-CM | POA: Insufficient documentation

## 2014-06-28 DIAGNOSIS — O0281 Inappropriate change in quantitative human chorionic gonadotropin (hCG) in early pregnancy: Secondary | ICD-10-CM | POA: Diagnosis not present

## 2014-06-28 DIAGNOSIS — O9989 Other specified diseases and conditions complicating pregnancy, childbirth and the puerperium: Principal | ICD-10-CM

## 2014-06-28 DIAGNOSIS — O26899 Other specified pregnancy related conditions, unspecified trimester: Secondary | ICD-10-CM

## 2014-06-28 LAB — HCG, QUANTITATIVE, PREGNANCY: HCG, BETA CHAIN, QUANT, S: 207 m[IU]/mL — AB (ref <5)

## 2014-06-28 NOTE — MAU Note (Signed)
Patient to MAU for repeat BHCG. Patient denies bleeding but does have a lot of mid abdominal, back and back of neck pain. Pain is intermittent and not having any pain at this time.

## 2014-06-28 NOTE — MAU Provider Note (Signed)
CC: Follow-up      HPI [name] is a 23 y.o. G3P1011 [redacted]w[redacted]d by LMP (6076w2d by US 8/13) who is here for her third serial quant. She first presented 6 d ago with intermittent pelvic cramping. She had some cramping and back pain earlier today but none at present. Has not had VB. Desired pregnancy.   Past Medical History  Diagnosis Date  . Medical history non-contributory     OB History  Gravida Para Term Preterm AB SAB TAB Ectopic Multiple Living  3 1 1  1  1   1     # Outcome Date GA Lbr Len/2nd Weight Sex Delivery Anes PTL Lv  3 CUR           2 TAB           1 TRM               Past Surgical History  Procedure Laterality Date  . Tonsillectomy    . Adenoidectomy    . Cesarean section      History   Social History  . Marital Status: Single    Spouse Name: N/A    Number of Children: N/A  . Years of Education: N/A   Occupational History  . Not on file.   Social History Main Topics  . Smoking status: Current Every Day Smoker    Types: Cigarettes  . Smokeless tobacco: Not on file  . Alcohol Use: Yes     Comment: occasional  . Drug Use: No  . Sexual Activity: Yes    Birth Control/ Protection: None   Other Topics Concern  . Not on file   Social History Narrative  . No narrative on file    No current facility-administered medications on file prior to encounter.   Current Outpatient Prescriptions on File Prior to Encounter  Medication Sig Dispense Refill  . Prenat w/o A Vit-FeFum-FePo-FA (CONCEPT OB) 130-92.4-1 MG CAPS Take 1 tablet by mouth daily.  30 capsule  12    No Known Allergies  ROS Pertinent items in HPI  PHYSICAL EXAM Filed Vitals:   06/28/14 1720  BP: 130/72  Pulse: 90  Temp: 99.4 F (37.4 C)  Resp: 16   General: Well nourished, well developed female in no acute distress Cardiovascular: Normal rate Respiratory: Normal effort Abdomen: Soft, nontender  LAB RESULTS Results for orders placed during the hospital encounter of  06/28/14 (from the past 24 hour(s))  HCG, QUANTITATIVE, PREGNANCY     Status: Abnormal   Collection Time    06/28/14  5:20 PM      Result Value Ref Range   hCG, Beta Chain, Quant, S 207 (*) <5 mIU/mL       Ref Range 5:20 PM  2d ago  4d ago     hCG, Beta Chain, Quant, S <5 mIU/mL 207 (H)  159 (H) CM 113 (H) CM   Comments:        IMAGING [place] Ob Comp Less 14 Wks  06/24/2014   CLINICAL DATA:  Abdominal pain. Quantitative beta HCG on 06/24/2014, 113.  EXAM: OBSTETRIC <14 WK US AND TRANSVAGINAL OB US  TECHNIQUE: Both transabdominal and transvaginal ultrasound examinations were performed for complete evaluation of the gestation as well as the maternal uterus, adnexal regions, and pelvic cul-de-sac. Transvaginal technique was performed to assess early pregnancy.  COMPARISON:  None.  FINDINGS: Intrauterine gestational sac: Tiny possible gestational sac is present in the endometrium.  Yolk sac:  None.  Embryo:  None.  Cardiac Activity: None.  Heart Rate:  Not applicable bpm  MSD:  2.2  mm   4 w   Five  d  [place] EDC: 02/26/2015.  Maternal uterus/adnexae: Physiologic appearance of the ovaries. No subchorionic hemorrhage. Small amount of free fluid in the anatomic pelvis. No adnexal mass.  IMPRESSION: Probable early intrauterine gestational sac, but no yolk sac, fetal pole, or cardiac activity yet visualized. Recommend follow-up quantitative B-HCG levels and follow-up US in 14 days to confirm and assess viability. This recommendation follows SRU consensus guidelines: Diagnostic Criteria for Nonviable Pregnancy Early in the First Trimester. Malva Limes Med 2013; 209:4709-62.   Electronically Signed   By: Andreas Newport M.D.   On: 06/24/2014 16:27   US Ob Transvaginal  06/24/2014   CLINICAL DATA:  Abdominal pain. Quantitative beta HCG on 06/24/2014, 113.  EXAM: OBSTETRIC <14 WK [place] AND TRANSVAGINAL OB US  TECHNIQUE: Both transabdominal and transvaginal ultrasound examinations were performed for complete evaluation  of the gestation as well as the maternal uterus, adnexal regions, and pelvic cul-de-sac. Transvaginal technique was performed to assess early pregnancy.  COMPARISON:  None.  FINDINGS: Intrauterine gestational sac: Tiny possible gestational sac is present in the endometrium.  Yolk sac:  None.  Embryo:  None.  Cardiac Activity: None.  Heart Rate:  Not applicable bpm  MSD:  2.2  mm   4 w   Five  d  [place] EDC: 02/26/2015.  Maternal uterus/adnexae: Physiologic appearance of the ovaries. No subchorionic hemorrhage. Small amount of free fluid in the anatomic pelvis. No adnexal mass.  IMPRESSION: Probable early intrauterine gestational sac, but no yolk sac, fetal pole, or cardiac activity yet visualized. Recommend follow-up quantitative B-HCG levels and follow-up US in 14 days to confirm and assess viability. This recommendation follows SRU consensus guidelines: Diagnostic Criteria for Nonviable Pregnancy Early in the First Trimester. Malva Limes Med 2013; 836:6294-76.   Electronically Signed   By: Andreas Newport M.D.   On: 06/24/2014 16:27    MAU COURSE  C/W Dr. Despina Hidden re: POC> F/U quant in 1 wk Explained to pt likely EPF and possible ectopic> expresses understanding  ASSESSMENT  G3P1011 at [redacted]w[redacted]d with abnormal rise in quants, pregnancy location unknown  PLAN Discharge home with ectopic precautions  See AVS for patient education.    Medication List         CONCEPT OB 130-92.4-1 MG Caps  Take 1 tablet by mouth daily.       Follow-up Information   Follow up with Nursepractioner Mau, NP In 1 week. (Repeat quant)         Danae Orleans, CNM 06/28/2014 7:16 PM

## 2014-07-05 ENCOUNTER — Encounter (HOSPITAL_COMMUNITY): Payer: Self-pay | Admitting: *Deleted

## 2014-07-05 ENCOUNTER — Inpatient Hospital Stay (HOSPITAL_COMMUNITY)
Admission: AD | Admit: 2014-07-05 | Discharge: 2014-07-05 | Disposition: A | Discharge status: Home / Self Care | Payer: Medicaid Other | Source: Ambulatory Visit | Attending: Obstetrics and Gynecology | Admitting: Obstetrics and Gynecology

## 2014-07-05 DIAGNOSIS — O99330 Smoking (tobacco) complicating pregnancy, unspecified trimester: Secondary | ICD-10-CM | POA: Insufficient documentation

## 2014-07-05 DIAGNOSIS — Z331 Pregnant state, incidental: Secondary | ICD-10-CM

## 2014-07-05 DIAGNOSIS — O3680X0 Pregnancy with inconclusive fetal viability, not applicable or unspecified: Secondary | ICD-10-CM

## 2014-07-05 DIAGNOSIS — O3680X1 Pregnancy with inconclusive fetal viability, fetus 1: Secondary | ICD-10-CM

## 2014-07-05 DIAGNOSIS — O99891 Other specified diseases and conditions complicating pregnancy: Secondary | ICD-10-CM | POA: Diagnosis present

## 2014-07-05 DIAGNOSIS — O9989 Other specified diseases and conditions complicating pregnancy, childbirth and the puerperium: Principal | ICD-10-CM

## 2014-07-05 LAB — HCG, QUANTITATIVE, PREGNANCY: HCG, BETA CHAIN, QUANT, S: 1329 m[IU]/mL — AB (ref <5)

## 2014-07-05 NOTE — Discharge Instructions (Signed)
°Ectopic Pregnancy °An ectopic pregnancy is when the fertilized egg attaches (implants) outside the uterus. Most ectopic pregnancies occur in the fallopian tube. Rarely do ectopic pregnancies occur on the ovary, intestine, pelvis, or cervix. In an ectopic pregnancy, the fertilized egg does not have the ability to develop into a normal, healthy baby.  °A ruptured ectopic pregnancy is one in which the fallopian tube gets torn or bursts and results in internal bleeding. Often there is intense abdominal pain, and sometimes, vaginal bleeding. Having an ectopic pregnancy can be life threatening. If left untreated, this dangerous condition can lead to a blood transfusion, abdominal surgery, or even death. °CAUSES  °Damage to the fallopian tubes is the suspected cause in most ectopic pregnancies.  °RISK FACTORS °Depending on your circumstances, the risk of having an ectopic pregnancy will vary. The level of risk can be divided into three categories. °High Risk °· You have gone through infertility treatment. °· You have had a previous ectopic pregnancy. °· You have had previous tubal surgery. °· You have had previous surgery to have the fallopian tubes tied (tubal ligation). °· You have tubal problems or diseases. °· You have been exposed to DES. DES is a medicine that was used until 1971 and had effects on babies whose mothers took the medicine. °· You become pregnant while using an intrauterine device (IUD) for birth control.  °Moderate Risk °· You have a history of infertility. °· You have a history of a sexually transmitted infection (STI). °· You have a history of pelvic inflammatory disease (PID). °· You have scarring from endometriosis. °· You have multiple sexual partners. °· You smoke.  °Low Risk °· You have had previous pelvic surgery. °· You use vaginal douching. °· You became sexually active before 23 years of age. °SIGNS AND SYMPTOMS  °An ectopic pregnancy should be suspected in anyone who has missed a period  and has abdominal pain or bleeding. °· You may experience normal pregnancy symptoms, such as: °¨ Nausea. °¨ Tiredness. °¨ Breast tenderness. °· Other symptoms may include: °¨ Pain with intercourse. °¨ Irregular vaginal bleeding or spotting. °¨ Cramping or pain on one side or in the lower abdomen. °¨ Fast heartbeat. °¨ Passing out while having a bowel movement. °· Symptoms of a ruptured ectopic pregnancy and internal bleeding may include: °¨ Sudden, severe pain in the abdomen and pelvis. °¨ Dizziness or fainting. °¨ Pain in the shoulder area. °DIAGNOSIS  °Tests that may be performed include: °· A pregnancy test. °· An ultrasound test. °· Testing the specific level of pregnancy hormone in the bloodstream. °· Taking a sample of uterus tissue (dilation and curettage, D&C). °· Surgery to perform a visual exam of the inside of the abdomen using a thin, lighted tube with a tiny camera on the end (laparoscope). °TREATMENT  °An injection of a medicine called methotrexate may be given. This medicine causes the pregnancy tissue to be absorbed. It is given if: °· The diagnosis is made early. °· The fallopian tube has not ruptured. °· You are considered to be a good candidate for the medicine. °Usually, pregnancy hormone blood levels are checked after methotrexate treatment. This is to be sure the medicine is effective. It may take 4-6 weeks for the pregnancy to be absorbed (though most pregnancies will be absorbed by 3 weeks). °Surgical treatment may be needed. A laparoscope may be used to remove the pregnancy tissue. If severe internal bleeding occurs, a cut (incision) may be made in the lower abdomen (laparotomy), and the ectopic   pregnancy is removed. This stops the bleeding. Part of the fallopian tube, or the whole tube, may be removed as well (salpingectomy). After surgery, pregnancy hormone tests may be done to be sure there is no pregnancy tissue left. You may receive a Rho (D) immune globulin shot if you are Rh negative  and the father is Rh positive, or if you do not know the Rh type of the father. This is to prevent problems with any future pregnancy. °SEEK IMMEDIATE MEDICAL CARE IF:  °You have any symptoms of an ectopic pregnancy. This is a medical emergency. °MAKE SURE YOU: °· Understand these instructions. °· Will watch your condition. °· Will get help right away if you are not doing well or get worse. °Document Released: 12/06/2004 Document Revised: 03/15/2014 Document Reviewed: 05/28/2013 °ExitCare® Patient Information ©2015 [employer]. This information is not intended to replace advice given to you by your health care provider. Make sure you discuss any questions you have with your health care provider. ° ° °

## 2014-07-05 NOTE — MAU Note (Signed)
Patient presents for repeat labwork; denies any pain, bleeding or discharge.

## 2014-07-05 NOTE — MAU Provider Note (Signed)
  History     CSN: [id number]  Arrival date and time: 07/05/14 1643   First Provider Initiated Contact with Patient 07/05/14 1746      Chief Complaint  Patient presents with  . Labs Only   HPI Comments: [name] 23 y.o. G3T5176 [redacted]w[redacted]d presents to MAU for follow up BHCG. She denies any pain or bleeding.       Past Medical History  Diagnosis Date  . Medical history non-contributory     Past Surgical History  Procedure Laterality Date  . Tonsillectomy    . Adenoidectomy    . Cesarean section      History reviewed. No pertinent family history.  History  Substance Use Topics  . Smoking status: Current Every Day Smoker    Types: Cigarettes  . Smokeless tobacco: Never Used  . Alcohol Use: Yes     Comment: occasional    Allergies: No Known Allergies  Prescriptions prior to admission  Medication Sig Dispense Refill  . Prenat w/o A Vit-FeFum-FePo-FA (CONCEPT OB) 130-92.4-1 MG CAPS Take 1 tablet by mouth daily.  30 capsule  12    Review of Systems  Constitutional: Negative.   Eyes: Negative.   Respiratory: Negative.   Cardiovascular: Negative.   Gastrointestinal: Negative.   Genitourinary: Negative.  Negative for dysuria.  Musculoskeletal: Negative.   Skin: Negative.    Physical Exam   Blood pressure 125/72, pulse 80, temperature 98.2 F (36.8 C), temperature source Oral, resp. rate 16, height 5' 3.5" (1.613 m), weight 61.349 kg (135 lb 4 oz), last menstrual period 05/25/2014.  Physical Exam  Constitutional: She is oriented to person, place, and time. She appears well-developed and well-nourished. No distress.  HENT:  Head: Normocephalic and atraumatic.  Eyes: Conjunctivae are normal. Pupils are equal, round, and reactive to light.  Neurological: She is alert and oriented to person, place, and time.  Skin: Skin is warm.  Psychiatric: She has a normal mood and affect. Her behavior is normal. Judgment and thought content normal.   Lab Results   Component Value Date   HCGBETAQNT 1329* 07/05/2014   HCGBETAQNT 207* 06/28/2014   HCGBETAQNT 159* 06/26/2014       MAU Course  Procedures  MDM   Assessment and Plan   A: Pregnancy of Unknown Location  P: Will repeat U/S on 07/08/14 She will return to MAU if any pain or bleeding develops Continue Pelvic rest    Carolynn Serve 07/05/2014, 6:02 PM

## 2014-07-06 NOTE — MAU Provider Note (Signed)
Attestation of Attending Supervision of Advanced Practitioner (CNM/NP): Evaluation and management procedures were performed by the Advanced Practitioner under my supervision and collaboration.  I have reviewed the Advanced Practitioner's note and chart, and I agree with the management and plan.  Ethelbert Thain 07/06/2014 1:26 AM

## 2014-07-07 NOTE — MAU Note (Signed)
Pt called today about U/S that was supposed to be scheduled for tomorrow, but she has not received a phone call & the U/S scheduler told the pt that she did not have an order.  Order found in signed & held, I released the order and the U/S scheduler states she will call the pt with an U/S time for tomorrow.

## 2014-07-09 ENCOUNTER — Encounter: Payer: Self-pay | Admitting: Advanced Practice Midwife

## 2014-07-09 ENCOUNTER — Ambulatory Visit (HOSPITAL_COMMUNITY)
Admission: RE | Admit: 2014-07-09 | Discharge: 2014-07-09 | Disposition: A | Discharge status: Home / Self Care | Payer: Medicaid Other | Source: Ambulatory Visit | Attending: Nurse Practitioner | Admitting: Nurse Practitioner

## 2014-07-09 ENCOUNTER — Inpatient Hospital Stay (HOSPITAL_COMMUNITY)
Admission: AD | Admit: 2014-07-09 | Discharge: 2014-07-09 | Disposition: A | Discharge status: Home / Self Care | Payer: Medicaid Other | Source: Ambulatory Visit | Attending: Obstetrics & Gynecology | Admitting: Obstetrics & Gynecology

## 2014-07-09 DIAGNOSIS — O99891 Other specified diseases and conditions complicating pregnancy: Secondary | ICD-10-CM | POA: Insufficient documentation

## 2014-07-09 DIAGNOSIS — O26899 Other specified pregnancy related conditions, unspecified trimester: Secondary | ICD-10-CM

## 2014-07-09 DIAGNOSIS — O3680X0 Pregnancy with inconclusive fetal viability, not applicable or unspecified: Secondary | ICD-10-CM | POA: Insufficient documentation

## 2014-07-09 DIAGNOSIS — R109 Unspecified abdominal pain: Secondary | ICD-10-CM | POA: Diagnosis present

## 2014-07-09 DIAGNOSIS — O3680X1 Pregnancy with inconclusive fetal viability, fetus 1: Secondary | ICD-10-CM

## 2014-07-09 DIAGNOSIS — O9989 Other specified diseases and conditions complicating pregnancy, childbirth and the puerperium: Principal | ICD-10-CM

## 2014-07-09 DIAGNOSIS — O208 Other hemorrhage in early pregnancy: Secondary | ICD-10-CM | POA: Insufficient documentation

## 2014-07-09 LAB — HCG, QUANTITATIVE, PREGNANCY: HCG, BETA CHAIN, QUANT, S: 4543 m[IU]/mL — AB (ref <5)

## 2014-07-09 NOTE — Discharge Instructions (Signed)

## 2014-07-09 NOTE — MAU Provider Note (Signed)
S: 23 y.o. G3P1011 @[redacted]w[redacted]d  by LMP presents to MAU for ultrasound results. She presented to MAU initially on 8/13 with abdominal pain and had quant hcg and ultrasound.  Her quant hcg has risen slowly and her pain is gone. She denies pain and bleeding today. This is a desired pregnancy.    O: BP 97/55  Pulse 87  Temp(Src) 98.2 F (36.8 C) (Oral)  Resp 16  LMP 05/25/2014  Results for orders placed during the hospital encounter of 07/09/14 (from the past 336 hour(s))  HCG, QUANTITATIVE, PREGNANCY   Collection Time    07/09/14  3:36 PM      Result Value Ref Range   hCG, Beta Chain, Quant, S 4543 (*) <5 mIU/mL  Results for orders placed during the hospital encounter of 07/05/14 (from the past 336 hour(s))  HCG, QUANTITATIVE, PREGNANCY   Collection Time    07/05/14  4:55 PM      Result Value Ref Range   hCG, Beta Chain, Quant, S 1329 (*) <5 mIU/mL  Results for orders placed during the hospital encounter of 06/28/14 (from the past 336 hour(s))  HCG, QUANTITATIVE, PREGNANCY   Collection Time    06/28/14  5:20 PM      Result Value Ref Range   hCG, Beta Chain, Quant, S 207 (*) <5 mIU/mL     A: Appropriate rise in hcg in 48 hours  P: Consult Dr Erin Fulling D/C home U/S in 1 week outpatient Return to MAU as needed for emergencies  Sharen Counter Certified Nurse-Midwife

## 2014-07-09 NOTE — MAU Note (Signed)
Pt had U/S, is waiting for results.  Pt denies pain or bleeding.

## 2014-07-12 NOTE — MAU Provider Note (Signed)
Attestation of Attending Supervision of Advanced Practitioner (CNM/NP): Evaluation and management procedures were performed by the Advanced Practitioner under my supervision and collaboration.  I have reviewed the Advanced Practitioner's note and chart, and I agree with the management and plan.  HARRAWAY-SMITH, Cami Delawder 9:11 AM

## 2014-07-14 ENCOUNTER — Encounter (HOSPITAL_COMMUNITY): Payer: Self-pay | Admitting: *Deleted

## 2014-07-14 ENCOUNTER — Inpatient Hospital Stay (HOSPITAL_COMMUNITY): Payer: Medicaid Other

## 2014-07-14 ENCOUNTER — Inpatient Hospital Stay (HOSPITAL_COMMUNITY)
Admission: AD | Admit: 2014-07-14 | Discharge: 2014-07-14 | Disposition: A | Discharge status: Home / Self Care | Payer: Medicaid Other | Source: Ambulatory Visit | Attending: Family Medicine | Admitting: Family Medicine

## 2014-07-14 DIAGNOSIS — R109 Unspecified abdominal pain: Secondary | ICD-10-CM | POA: Diagnosis present

## 2014-07-14 DIAGNOSIS — O99891 Other specified diseases and conditions complicating pregnancy: Secondary | ICD-10-CM | POA: Diagnosis not present

## 2014-07-14 DIAGNOSIS — O99330 Smoking (tobacco) complicating pregnancy, unspecified trimester: Secondary | ICD-10-CM | POA: Insufficient documentation

## 2014-07-14 DIAGNOSIS — O26899 Other specified pregnancy related conditions, unspecified trimester: Secondary | ICD-10-CM

## 2014-07-14 DIAGNOSIS — O9989 Other specified diseases and conditions complicating pregnancy, childbirth and the puerperium: Principal | ICD-10-CM

## 2014-07-14 LAB — URINALYSIS, ROUTINE W REFLEX MICROSCOPIC
BILIRUBIN URINE: NEGATIVE
Glucose, UA: NEGATIVE mg/dL
HGB URINE DIPSTICK: NEGATIVE
KETONES UR: NEGATIVE mg/dL
Leukocytes, UA: NEGATIVE
Nitrite: NEGATIVE
PROTEIN: NEGATIVE mg/dL
SPECIFIC GRAVITY, URINE: 1.01 (ref 1.005–1.030)
UROBILINOGEN UA: 0.2 mg/dL (ref 0.0–1.0)
pH: 6.5 (ref 5.0–8.0)

## 2014-07-14 NOTE — Discharge Instructions (Signed)

## 2014-07-14 NOTE — MAU Provider Note (Signed)
History     CSN: [id number]  Arrival date and time: 07/14/14 4098   First Provider Initiated Contact with Patient 07/14/14 1030      Chief Complaint  Patient presents with  . Abdominal Cramping   HPI  [name] is a 23 y.o. female G3P1011 at [redacted]w[redacted]d who presents with abdominal cramping in her lower abdomen; this is a new complaint. She has been followed closely in MAU for beta hcg levels that have not risen appropriately. She is scheduled for an [place] on Friday for viability. She has not had any pain or bleeding so far with the pregnancy; this is the first encounter. She denies vaginal bleeding.    OB History   Grav Para Term Preterm Abortions TAB SAB Ect Mult Living   Past Medical History  Diagnosis Date  . Medical history non-contributory     Past Surgical History  Procedure Laterality Date  . Tonsillectomy    . Adenoidectomy    . Cesarean section      History reviewed. No pertinent family history.  History  Substance Use Topics  . Smoking status: Current Every Day Smoker -- 0.50 packs/day    Types: Cigarettes  . Smokeless tobacco: Never Used  . Alcohol Use: Yes     Comment: occasional    Allergies: No Known Allergies  Prescriptions prior to admission  Medication Sig Dispense Refill  . acetaminophen (TYLENOL) 500 MG tablet Take 500 mg by mouth every 6 (six) hours as needed for mild pain.      . Multiple Vitamins-Minerals (MULTIVITAMIN WITH MINERALS) tablet Take 1 tablet by mouth daily.      . Prenatal Vit-Fe Fumarate-FA (PRENATAL MULTIVITAMIN) TABS tablet Take 1 tablet by mouth daily at 12 noon.       Results for orders placed during the hospital encounter of 07/14/14 (from the past 48 hour(s))  URINALYSIS, ROUTINE W REFLEX MICROSCOPIC     Status: None   Collection Time    07/14/14 10:10 AM      Result Value Ref Range   Color, Urine YELLOW  YELLOW   APPearance CLEAR  CLEAR   Specific Gravity, Urine 1.010  1.005 - 1.030   pH  6.5  5.0 - 8.0   Glucose, UA NEGATIVE  NEGATIVE mg/dL   Hgb urine dipstick NEGATIVE  NEGATIVE   Bilirubin Urine NEGATIVE  NEGATIVE   Ketones, ur NEGATIVE  NEGATIVE mg/dL   Protein, ur NEGATIVE  NEGATIVE mg/dL   Urobilinogen, UA 0.2  0.0 - 1.0 mg/dL   Nitrite NEGATIVE  NEGATIVE   Leukocytes, UA NEGATIVE  NEGATIVE   Comment: MICROSCOPIC NOT DONE ON URINES WITH NEGATIVE PROTEIN, BLOOD, LEUKOCYTES, NITRITE, OR GLUCOSE <1000 mg/dL.     US Ob Transvaginal  07/14/2014   CLINICAL DATA:  Viability.  Abdominal pain.  EXAM: TRANSVAGINAL OB ULTRASOUND  TECHNIQUE: Transvaginal ultrasound was performed for complete evaluation of the gestation as well as the maternal uterus, adnexal regions, and pelvic cul-de-sac.  COMPARISON:  07/09/2014  FINDINGS: Intrauterine gestational sac: Visualized/normal in shape.  Yolk sac:  Not visualized  Embryo:  Not visualized  Cardiac Activity: Not visualized  MSD: 11  mm   5 w   6  d  Maternal uterus/adnexae: No subchorionic hemorrhage. Right corpus luteum cyst noted. No adnexal masses or free fluid.  IMPRESSION: Probable early intrauterine gestational sac. No yolk sac or fetal pole currently. Recommend follow-up  quantitative B-HCG levels and follow-up US in 14 days to confirm and assess viability. This recommendation follows SRU consensus guidelines: Diagnostic Criteria for Nonviable Pregnancy Early in the First Trimester. Malva Limes Med 2013; 161:0960-45. This could be followed with repeat ultrasound in 14 days.   Electronically Signed   By: Charlett Nose M.D.   On: 07/14/2014 12:08    Review of Systems  Constitutional: Positive for chills. Negative for fever.  Gastrointestinal: Positive for abdominal pain. Negative for nausea, vomiting, diarrhea and constipation.  Genitourinary: Negative for dysuria, urgency and frequency.       No vaginal discharge. No vaginal bleeding. No dysuria.    Physical Exam   Blood pressure 109/54, pulse 85, temperature 98.7 F (37.1 C), resp.  rate 18, height 5' 3.5" (1.613 m), weight 61.326 kg (135 lb 3.2 oz), last menstrual period 05/25/2014, SpO2 100.00%.  Physical Exam  Constitutional: She is oriented to person, place, and time. She appears well-developed and well-nourished. No distress.  HENT:  Head: Normocephalic.  Eyes: Pupils are equal, round, and reactive to light.  Neck: Neck supple.  Respiratory: Effort normal.  GI: Soft. Normal appearance. There is tenderness in the right lower quadrant, suprapubic area and left lower quadrant. There is no rigidity, no rebound, no guarding and no CVA tenderness.  Musculoskeletal: Normal range of motion.  Neurological: She is alert and oriented to person, place, and time.  Skin: Skin is warm. She is not diaphoretic.  Psychiatric: Her behavior is normal.    MAU Course  Procedures None  MDM Beta hcg levels: 8/13: 113 8/15: 159 8/17: 207 8/24: 1329 8/29: 4543  UA  [place] for viability; I will cancel her appointment that is scheduled on Friday.  Discussed US findings with Dr. Adrian Blackwater   Assessment and Plan   A:  Abdominal pain in pregnancy Pregnancy of unknown location, cannot rule out ectopic pregnancy Probable early gestational sac on [place]  P:  [place] home in stable condition Ectopic precautions discussed at length Repeat US in 7 days Return to MAU with worsening pain and/or bleeding Pelvic rest  Iona Hansen Rasch, NP  07/14/2014, 10:30 AM

## 2014-07-14 NOTE — MAU Provider Note (Signed)
Attestation of Attending Supervision of Advanced Practitioner (PA/CNM/NP): Evaluation and management procedures were performed by the Advanced Practitioner under my supervision and collaboration.  I have reviewed the Advanced Practitioner's note and chart, and I agree with the management and plan.  Jacob Stinson, DO Attending Physician Faculty Practice, [facility]  

## 2014-07-14 NOTE — MAU Note (Signed)
Pt reports she has been having really bad abd cramping this morning. Denies vag bleeding or discharge at this time.

## 2014-07-16 ENCOUNTER — Ambulatory Visit (HOSPITAL_COMMUNITY): Admission: RE | Admit: 2014-07-16 | Payer: Self-pay | Source: Ambulatory Visit

## 2014-07-21 ENCOUNTER — Inpatient Hospital Stay (HOSPITAL_COMMUNITY)
Admission: AD | Admit: 2014-07-21 | Discharge: 2014-07-21 | Disposition: A | Discharge status: Home / Self Care | Payer: Medicaid Other | Source: Ambulatory Visit | Attending: Obstetrics & Gynecology | Admitting: Obstetrics & Gynecology

## 2014-07-21 ENCOUNTER — Ambulatory Visit (HOSPITAL_COMMUNITY)
Admission: RE | Admit: 2014-07-21 | Discharge: 2014-07-21 | Disposition: A | Discharge status: Home / Self Care | Payer: Medicaid Other | Source: Ambulatory Visit | Attending: Obstetrics and Gynecology | Admitting: Obstetrics and Gynecology

## 2014-07-21 DIAGNOSIS — R109 Unspecified abdominal pain: Secondary | ICD-10-CM | POA: Insufficient documentation

## 2014-07-21 DIAGNOSIS — O9989 Other specified diseases and conditions complicating pregnancy, childbirth and the puerperium: Principal | ICD-10-CM

## 2014-07-21 DIAGNOSIS — O26899 Other specified pregnancy related conditions, unspecified trimester: Secondary | ICD-10-CM

## 2014-07-21 DIAGNOSIS — O200 Threatened abortion: Secondary | ICD-10-CM | POA: Diagnosis present

## 2014-07-21 DIAGNOSIS — O99891 Other specified diseases and conditions complicating pregnancy: Secondary | ICD-10-CM | POA: Diagnosis not present

## 2014-07-21 NOTE — Discharge Instructions (Signed)
Threatened Miscarriage °A threatened miscarriage occurs when you have vaginal bleeding during your first 20 weeks of pregnancy but the pregnancy has not ended. If you have vaginal bleeding during this time, your health care provider will do tests to make sure you are still pregnant. If the tests show you are still pregnant and the developing baby (fetus) inside your womb (uterus) is still growing, your condition is considered a threatened miscarriage. °A threatened miscarriage does not mean your pregnancy will end, but it does increase the risk of losing your pregnancy (complete miscarriage). °CAUSES  °The cause of a threatened miscarriage is usually not known. If you go on to have a complete miscarriage, the most common cause is an abnormal number of chromosomes in the developing baby. Chromosomes are the structures inside cells that hold all your genetic material. °Some causes of vaginal bleeding that do not result in miscarriage include: °· Having sex. °· Having an infection. °· Normal hormone changes of pregnancy. °· Bleeding that occurs when an egg implants in your uterus. °RISK FACTORS °Risk factors for bleeding in early pregnancy include: °· Obesity. °· Smoking. °· Drinking excessive amounts of alcohol or caffeine. °· Recreational drug use. °SIGNS AND SYMPTOMS °· Light vaginal bleeding. °· Mild abdominal pain or cramps. °DIAGNOSIS  °If you have bleeding with or without abdominal pain before 20 weeks of pregnancy, your health care provider will do tests to check whether you are still pregnant. One important test involves using sound waves and a computer (ultrasound) to create images of the inside of your uterus. Other tests include an internal exam of your vagina and uterus (pelvic exam) and measurement of your baby's heart rate.  °You may be diagnosed with a threatened miscarriage if: °· Ultrasound testing shows you are still pregnant. °· Your baby's heart rate is strong. °· A pelvic exam shows that the  opening between your uterus and your vagina (cervix) is closed. °· Your heart rate and blood pressure are stable. °· Blood tests confirm you are still pregnant. °TREATMENT  °No treatments have been shown to prevent a threatened miscarriage from going on to a complete miscarriage. However, the right home care is important.  °HOME CARE INSTRUCTIONS  °· Make sure you keep all your appointments for prenatal care. This is very important. °· Get plenty of rest. °· Do not have sex or use tampons if you have vaginal bleeding. °· Do not douche. °· Do not smoke or use recreational drugs. °· Do not drink alcohol. °· Avoid caffeine. °SEEK MEDICAL CARE IF: °· You have light vaginal bleeding or spotting while pregnant. °· You have abdominal pain or cramping. °· You have a fever. °SEEK IMMEDIATE MEDICAL CARE IF: °· You have heavy vaginal bleeding. °· You have blood clots coming from your vagina. °· You have severe low back pain or abdominal cramps. °· You have fever, chills, and severe abdominal pain. °MAKE SURE YOU: °· Understand these instructions. °· Will watch your condition. °· Will get help right away if you are not doing well or get worse. °Document Released: 10/29/2005 Document Revised: 11/03/2013 Document Reviewed: 08/25/2013 °ExitCare® Patient Information ©2015 [employer]. This information is not intended to replace advice given to you by your health care provider. Make sure you discuss any questions you have with your health care provider. ° °Vaginal Bleeding During Pregnancy, First Trimester °A small amount of bleeding (spotting) from the vagina is relatively common in early pregnancy. It usually stops on its own. Various things may cause bleeding   or spotting in early pregnancy. Some bleeding may be related to the pregnancy, and some may not. In most cases, the bleeding is normal and is not a problem. However, bleeding can also be a sign of something serious. Be sure to tell your health care provider about any  vaginal bleeding right away. °Some possible causes of vaginal bleeding during the first trimester include: °· Infection or inflammation of the cervix. °· Growths (polyps) on the cervix. °· Miscarriage or threatened miscarriage. °· Pregnancy tissue has developed outside of the uterus and in a fallopian tube (tubal pregnancy). °· Tiny cysts have developed in the uterus instead of pregnancy tissue (molar pregnancy). °HOME CARE INSTRUCTIONS  °Watch your condition for any changes. The following actions may help to lessen any discomfort you are feeling: °· Follow your health care provider's instructions for limiting your activity. If your health care provider orders bed rest, you may need to stay in bed and only get up to use the bathroom. However, your health care provider may allow you to continue light activity. °· If needed, make plans for someone to help with your regular activities and responsibilities while you are on bed rest. °· Keep track of the number of pads you use each day, how often you change pads, and how soaked (saturated) they are. Write this down. °· Do not use tampons. Do not douche. °· Do not have sexual intercourse or orgasms until approved by your health care provider. °· If you pass any tissue from your vagina, save the tissue so you can show it to your health care provider. °· Only take over-the-counter or prescription medicines as directed by your health care provider. °· Do not take aspirin because it can make you bleed. °· Keep all follow-up appointments as directed by your health care provider. °SEEK MEDICAL CARE IF: °· You have any vaginal bleeding during any part of your pregnancy. °· You have cramps or labor pains. °· You have a fever, not controlled by medicine. °SEEK IMMEDIATE MEDICAL CARE IF:  °· You have severe cramps in your back or belly (abdomen). °· You pass large clots or tissue from your vagina. °· Your bleeding increases. °· You feel light-headed or weak, or you have fainting  episodes. °· You have chills. °· You are leaking fluid or have a gush of fluid from your vagina. °· You pass out while having a bowel movement. °MAKE SURE YOU: °· Understand these instructions. °· Will watch your condition. °· Will get help right away if you are not doing well or get worse. °Document Released: 08/08/2005 Document Revised: 11/03/2013 Document Reviewed: 07/06/2013 °ExitCare® Patient Information ©2015 [employer]. This information is not intended to replace advice given to you by your health care provider. Make sure you discuss any questions you have with your health care provider. ° °Pelvic Rest °Pelvic rest is sometimes recommended for women when:  °· The placenta is partially or completely covering the opening of the cervix (placenta previa). °· There is bleeding between the uterine wall and the amniotic sac in the first trimester (subchorionic hemorrhage). °· The cervix begins to open without labor starting (incompetent cervix, cervical insufficiency). °· The labor is too early (preterm labor). °HOME CARE INSTRUCTIONS °· Do not have sexual intercourse, stimulation, or an orgasm. °· Do not use tampons, douche, or put anything in the vagina. °· Do not lift anything over 10 pounds (4.5 kg). °· Avoid strenuous activity or straining your pelvic muscles. °SEEK MEDICAL CARE IF:  °· You have   any vaginal bleeding during pregnancy. Treat this as a potential emergency. °· You have cramping pain felt low in the stomach (stronger than menstrual cramps). °· You notice vaginal discharge (watery, mucus, or bloody). °· You have a low, dull backache. °· There are regular contractions or uterine tightening. °SEEK IMMEDIATE MEDICAL CARE IF: °You have vaginal bleeding and have placenta previa.  °Document Released: 02/23/2011 Document Revised: 01/21/2012 Document Reviewed: 02/23/2011 °ExitCare® Patient Information ©2015 [employer]. This information is not intended to replace advice given to you by your health care  provider. Make sure you discuss any questions you have with your health care provider. ° °

## 2014-07-21 NOTE — MAU Provider Note (Signed)
History     CSN: [id number]  Arrival date and time: 07/21/14 1540   None     Chief Complaint  Patient presents with  . Follow-up   HPI Comments: [name] 23 y.o. [id number] [redacted]w[redacted]d presents to MAU for follow up after her second ultrasound. This second ultrasound shows no real change from the first one on 07/14/14. There is still no YS, fetal pole, embryo or cardiac activity. The Radiologist recommendations are to wait 2 weeks and repeat the U/S for a confirmed failed pregnancy. This works well for the patient as this is a very wanted pregnancy and she believes the dating is incorrect. She denies any pain or bleeding. She plans her prenatal care at [facility]     Past Medical History  Diagnosis Date  . Medical history non-contributory     Past Surgical History  Procedure Laterality Date  . Tonsillectomy    . Adenoidectomy    . Cesarean section      No family history on file.  History  Substance Use Topics  . Smoking status: Current Every Day Smoker -- 0.50 packs/day    Types: Cigarettes  . Smokeless tobacco: Never Used  . Alcohol Use: Yes     Comment: occasional    Allergies: No Known Allergies  Prescriptions prior to admission  Medication Sig Dispense Refill  . acetaminophen (TYLENOL) 500 MG tablet Take 500 mg by mouth every 6 (six) hours as needed for mild pain.      . Multiple Vitamins-Minerals (MULTIVITAMIN WITH MINERALS) tablet Take 1 tablet by mouth daily.      . Prenatal Vit-Fe Fumarate-FA (PRENATAL MULTIVITAMIN) TABS tablet Take 1 tablet by mouth daily at 12 noon.        Review of Systems  Constitutional: Negative.   HENT: Negative.   Eyes: Negative.   Respiratory: Negative.   Cardiovascular: Negative.   Gastrointestinal: Negative.   Genitourinary: Negative.   Musculoskeletal: Negative.   Skin: Negative.   Neurological: Negative.   Endo/Heme/Allergies: Negative.   Psychiatric/Behavioral: Negative.    Physical Exam   Blood pressure  110/58, pulse 67, temperature 99.4 F (37.4 C), resp. rate 16, last menstrual period 05/25/2014, SpO2 100.00%.  Physical Exam  Constitutional: She is oriented to person, place, and time. She appears well-developed and well-nourished. No distress.  HENT:  Head: Normocephalic and atraumatic.  Eyes: Conjunctivae are normal. Pupils are equal, round, and reactive to light.  Musculoskeletal: Normal range of motion.  Neurological: She is alert and oriented to person, place, and time.  Skin: Skin is warm and dry.  Psychiatric: She has a normal mood and affect. Her behavior is normal. Judgment and thought content normal.   US Ob Transvaginal  07/21/2014   CLINICAL DATA:  Viability  EXAM: TRANSVAGINAL OB ULTRASOUND  TECHNIQUE: Transvaginal ultrasound was performed for complete evaluation of the gestation as well as the maternal uterus, adnexal regions, and pelvic cul-de-sac.  COMPARISON:  07/14/2014.  FINDINGS: Intrauterine gestational sac: Visualized/normal in shape.  Yolk sac:  Not visualized  Embryo:  Not visualized  Cardiac Activity: Not visualized  Heart Rate:  bpm  MSD: 11.3  mm   5 w   6  d  CRL:     mm    w  d                  [place] EDC:  Maternal uterus/adnexae: No subchorionic hemorrhage. No adnexal masses or free fluid.  IMPRESSION: No significant change in the gestational  sac size since 7 days ago. No yolk sac or fetal pole. Findings are suspicious but not yet definitive for failed pregnancy. Recommend follow-up US in 10-14 days for definitive diagnosis. This recommendation follows SRU consensus guidelines: Diagnostic Criteria for Nonviable Pregnancy Early in the First Trimester. Malva Limes Med 2013; 711:6579-03.   Electronically Signed   By: Charlett Nose M.D.   On: 07/21/2014 15:58     MAU Course  Procedures  MDM  Assessment and Plan   A:  Threatened Miscarriage  P: Pt is to return to MAU with any pain or bleeding Pelvic rest Return to ultrasound on August 04, 2014    Carolynn Serve 07/21/2014, 4:45 PM

## 2014-07-21 NOTE — MAU Note (Signed)
Patient to MAU for ultrasound results. Denies pain or bleeding.

## 2014-07-22 NOTE — MAU Provider Note (Signed)
Attestation of Attending Supervision of Advanced Practitioner (PA/CNM/NP): Evaluation and management procedures were performed by the Advanced Practitioner under my supervision and collaboration.  I have reviewed the Advanced Practitioner's note and chart, and I agree with the management and plan.  [place] Kuang, MD, FACOG Attending Obstetrician & Gynecologist Faculty Practice, [facility]   

## 2014-07-26 ENCOUNTER — Inpatient Hospital Stay (HOSPITAL_COMMUNITY)
Admission: AD | Admit: 2014-07-26 | Discharge: 2014-07-26 | Disposition: A | Discharge status: Home / Self Care | Payer: Medicaid Other | Source: Ambulatory Visit | Attending: Obstetrics & Gynecology | Admitting: Obstetrics & Gynecology

## 2014-07-26 DIAGNOSIS — F17200 Nicotine dependence, unspecified, uncomplicated: Secondary | ICD-10-CM | POA: Diagnosis not present

## 2014-07-26 DIAGNOSIS — R109 Unspecified abdominal pain: Secondary | ICD-10-CM | POA: Insufficient documentation

## 2014-07-26 LAB — URINALYSIS, ROUTINE W REFLEX MICROSCOPIC
Bilirubin Urine: NEGATIVE
Glucose, UA: NEGATIVE mg/dL
Hgb urine dipstick: NEGATIVE
Ketones, ur: 15 mg/dL — AB
Leukocytes, UA: NEGATIVE
Nitrite: NEGATIVE
Protein, ur: NEGATIVE mg/dL
Specific Gravity, Urine: 1.02 (ref 1.005–1.030)
Urobilinogen, UA: 1 mg/dL (ref 0.0–1.0)
pH: 6 (ref 5.0–8.0)

## 2014-07-26 MED ORDER — DOCUSATE SODIUM 100 MG PO CAPS: 100.0000 mg | ORAL_CAPSULE | Freq: Two times a day (BID) | ORAL | Status: DC

## 2014-07-26 NOTE — MAU Note (Signed)
Pt reports constant pain at umbilicus since 3 pm. Denies dysuria. Denies vaginal bleeding.

## 2014-07-26 NOTE — MAU Provider Note (Signed)
History     CSN: [id number]  Arrival date and time: 07/26/14 2145   First Provider Initiated Contact with Patient 07/26/14 2247      Chief Complaint  Patient presents with  . Abdominal Pain   HPI [name] is a 23 y.o. G3P1011 at [redacted]w[redacted]d who presents to MAU today with complaint of abdominal pain in a small area just right of the umbilicus since 1500 today. She denies N/V/D or constipation. Last BM was yesterday and normal. She denies fever, vaginal bleeding, discharge or UTI symptoms. Patient has had numerous [place] during this pregnancy that show concern for a blighted ovum that has not been officially diagnosed at this time. Patient has follow-up US on 08/04/14.   OB History   Grav Para Term Preterm Abortions TAB SAB Ect Mult Living   Past Medical History  Diagnosis Date  . Medical history non-contributory     Past Surgical History  Procedure Laterality Date  . Tonsillectomy    . Adenoidectomy    . Cesarean section      No family history on file.  History  Substance Use Topics  . Smoking status: Current Every Day Smoker -- 0.50 packs/day    Types: Cigarettes  . Smokeless tobacco: Never Used  . Alcohol Use: Yes     Comment: occasional    Allergies: No Known Allergies  Prescriptions prior to admission  Medication Sig Dispense Refill  . acetaminophen (TYLENOL) 500 MG tablet Take 500 mg by mouth every 6 (six) hours as needed for mild pain.      . Multiple Vitamins-Minerals (MULTIVITAMIN WITH MINERALS) tablet Take 1 tablet by mouth daily.      . Prenatal Vit-Fe Fumarate-FA (PRENATAL MULTIVITAMIN) TABS tablet Take 1 tablet by mouth daily at 12 noon.        Review of Systems  Constitutional: Negative for fever and malaise/fatigue.  Gastrointestinal: Positive for abdominal pain. Negative for nausea, vomiting, diarrhea and constipation.  Genitourinary: Negative for dysuria, urgency and frequency.       Neg - vaginal bleeding, discharge    Physical Exam   Blood pressure 118/55, pulse 88, temperature 98.2 F (36.8 C), temperature source Oral, resp. rate 16, height  (1.6 m), weight 136 lb (61.689 kg), last menstrual period 05/25/2014, SpO2 100.00%.  Physical Exam  Constitutional: She is oriented to person, place, and time. She appears well-developed and well-nourished. No distress.  HENT:  Head: Normocephalic.  Cardiovascular: Normal rate.   Respiratory: Effort normal.  GI: Soft. She exhibits no distension and no mass. There is tenderness (moderate tenderness to palpation of a pinpoint area just right of the umbilicus). There is no rebound and no guarding. No hernia.  Neurological: She is alert and oriented to person, place, and time.  Skin: Skin is warm and dry. No erythema.  Psychiatric: She has a normal mood and affect.   Results for orders placed during the hospital encounter of 07/26/14 (from the past 24 hour(s))  URINALYSIS, ROUTINE W REFLEX MICROSCOPIC     Status: Abnormal   Collection Time    07/26/14 10:11 PM      Result Value Ref Range   Color, Urine YELLOW  YELLOW   APPearance CLEAR  CLEAR   Specific Gravity, Urine 1.020  1.005 - 1.030   pH 6.0  5.0 - 8.0   Glucose, UA NEGATIVE  NEGATIVE mg/dL   Hgb urine dipstick  NEGATIVE  NEGATIVE   Bilirubin Urine NEGATIVE  NEGATIVE   Ketones, ur 15 (*) NEGATIVE mg/dL   Protein, ur NEGATIVE  NEGATIVE mg/dL   Urobilinogen, UA 1.0  0.0 - 1.0 mg/dL   Nitrite NEGATIVE  NEGATIVE   Leukocytes, UA NEGATIVE  NEGATIVE     MAU Course  Procedures None  MDM UA today Discussed with patient that this is unlikely a result of the pregnancy since the pain is not pelvic Discussed use of stool softener, increased hydration and avoidance of foods that produce gas  Assessment and Plan  A: Abdominal pain  P: Discharge home Rx for Colace given to patient Patient advised to increase PO hydration as tolerated Patient advised to keep appointment for follow-up US as  scheduled  Discussed warning signs for appendicitis and hernia and when to seek emergent medical attention Patient may return to MAU as needed or if her condition were to change or worsen   Marny Lowenstein, PA-C  07/26/2014, 10:47 PM

## 2014-07-26 NOTE — MAU Provider Note (Signed)
Attestation of Attending Supervision of Advanced Practitioner (PA/CNM/NP): Evaluation and management procedures were performed by the Advanced Practitioner under my supervision and collaboration.  I have reviewed the Advanced Practitioner's note and chart, and I agree with the management and plan.  Paysen Goza, MD, FACOG Attending Obstetrician & Gynecologist Faculty Practice, [facility]   

## 2014-07-26 NOTE — Discharge Instructions (Signed)

## 2014-07-27 ENCOUNTER — Ambulatory Visit (HOSPITAL_COMMUNITY): Payer: Self-pay

## 2014-08-04 ENCOUNTER — Ambulatory Visit (HOSPITAL_COMMUNITY)
Admission: RE | Admit: 2014-08-04 | Discharge: 2014-08-04 | Disposition: A | Discharge status: Home / Self Care | Payer: Medicaid Other | Source: Ambulatory Visit | Attending: Advanced Practice Midwife | Admitting: Advanced Practice Midwife

## 2014-08-04 ENCOUNTER — Inpatient Hospital Stay (HOSPITAL_COMMUNITY)
Admission: AD | Admit: 2014-08-04 | Discharge: 2014-08-04 | Disposition: A | Discharge status: Home / Self Care | Payer: Medicaid Other | Source: Ambulatory Visit | Attending: Family Medicine | Admitting: Family Medicine

## 2014-08-04 DIAGNOSIS — O9989 Other specified diseases and conditions complicating pregnancy, childbirth and the puerperium: Principal | ICD-10-CM

## 2014-08-04 DIAGNOSIS — R109 Unspecified abdominal pain: Secondary | ICD-10-CM | POA: Diagnosis not present

## 2014-08-04 DIAGNOSIS — O99891 Other specified diseases and conditions complicating pregnancy: Secondary | ICD-10-CM | POA: Diagnosis present

## 2014-08-04 DIAGNOSIS — O021 Missed abortion: Secondary | ICD-10-CM | POA: Insufficient documentation

## 2014-08-04 DIAGNOSIS — O26899 Other specified pregnancy related conditions, unspecified trimester: Secondary | ICD-10-CM

## 2014-08-04 LAB — HCG, QUANTITATIVE, PREGNANCY: HCG, BETA CHAIN, QUANT, S: 14507 m[IU]/mL — AB (ref <5)

## 2014-08-04 NOTE — MAU Provider Note (Signed)
Subjective:  Ms [name] is a 23 y.o.female G3P1011 at [redacted]w[redacted]d who presents to MAU following an [place] for viability. She has had several US done in MAU and with slight increase in gestational sac however no yolk sac or embryo identified.    Objective:  GENERAL: Well-developed, well-nourished female in no acute distress.  HEENT: Normocephalic, atraumatic.   LUNGS: Effort normal HEART: Regular rate  SKIN: Warm, dry and without erythema PSYCH: Normal mood and affect  Filed Vitals:   08/04/14 1136  BP: 107/54  Pulse: 67  Temp: 98.6 F (37 C)  Resp: 16    Results for orders placed during the hospital encounter of 08/04/14 (from the past 48 hour(s))  HCG, QUANTITATIVE, PREGNANCY     Status: Abnormal   Collection Time    08/04/14  1:10 PM      Result Value Ref Range   hCG, Beta Chain, Quant, S 14507 (*) <5 mIU/mL   Comment:              GEST. AGE      CONC.  (mIU/mL)       <=1 WEEK        5 - 50         2 WEEKS       50 - 500         3 WEEKS       100 - 10,000         4 WEEKS     1,000 - 30,000         5 WEEKS     3,500 - 115,000       6-8 WEEKS     12,000 - 270,000        12 WEEKS     15,000 - 220,000                FEMALE AND NON-PREGNANT FEMALE:         LESS THAN 5 mIU/mL    US Ob Transvaginal  08/04/2014   CLINICAL DATA:  Followup viability.  EXAM: TRANSVAGINAL OB ULTRASOUND  TECHNIQUE: Transvaginal ultrasound was performed for complete evaluation of the gestation as well as the maternal uterus, adnexal regions, and pelvic cul-de-sac.  COMPARISON:  07/21/2014 and 07/09/2014.  FINDINGS: Intrauterine gestational sac: Single intrauterine fluid collection is identified  Yolk sac:  No  Embryo:  No  Cardiac Activity: No  Heart Rate: Not up bpm  MSD: 1.2 cm  mm  6  w   0  d      [place] EDC: 03/30/2015.  Maternal uterus/adnexae:  Subchorionic hemorrhage: None  Right ovary: Probable resolving corpus luteal cyst noted within the right ovary.  Left ovary: Appears normal  Other :None  Free  fluid:  None  IMPRESSION: 1. Single intrauterine fluid collection without evidence for yolk sac or embryo. Findings meet definitive criteria for failed pregnancy. This follows SRU consensus guidelines: Diagnostic Criteria for Nonviable Pregnancy Early in the First Trimester. Macy Mis J Med [phone].   Electronically Signed   By: Signa Kell M.D.   On: 08/04/2014 11:41    MDM  8/13: 113 8/15: 159 8/17: 207 8/24: 1329 8/28: 7416    -Discussed findings with Dr. Adrian Blackwater.  -I dicussed options of watchful waiting, cytotec or D&C and went over risks involved with each. The patient was very upset and states that she is unable to make a decision right now or today. She feels she may have to do the  D&C due to her work schedule and difficulty taking time off. I offered her an appointment tomorrow in the clinic to discuss and possibly schedule the surgery, however she is unable to take this appointment due to her work schedule. I informed her that I would send a message to the clinic and they would call her to schedule a follow up appointment to discuss her desired plan.   -Consult to spiritual care -Beta hcg  -message sent to the clinic   A: Failed pregnancy  P: Discharge home in stable condition Patient to follow up in the clinic; message sent Return to MAU with any increased pain or bleeding Support given.    Gwendolyn Hansen Brandilyn Nanninga, NP 08/04/2014 1:16 PM

## 2014-08-04 NOTE — Progress Notes (Signed)
08/04/14 1400  Clinical Encounter Type  Visited With Patient  Visit Type Spiritual support;Social support  Referral From Nurse [facility], RN)  Spiritual Encounters  Spiritual Needs Grief support;Emotional;Brochure (Comfort resources)  Stress Factors  Patient Stress Factors Loss   Made detailed visit with Gisele this afternoon upon confirmation of pregnancy loss.  She was tearful, open, receptive, and interested in support resources.  Provided pastoral presence, reflective listening, grief education, and Comfort resources (Heartstrings, Share, Comfort brochures; handmade Comfort heart).  She was very appreciative of support and care and may reach out to Spiritual Care at her f/u appointment (not yet scheduled).    [address] [place], [place] [phone]

## 2014-08-04 NOTE — MAU Note (Signed)
Chaplain with pt.   

## 2014-08-04 NOTE — MAU Note (Signed)
Patient to MAU after ultrasound. Denies pain but has a pink discharge of and on.

## 2014-08-04 NOTE — MAU Provider Note (Signed)
Attestation of Attending Supervision of Advanced Practitioner (PA/CNM/NP): Evaluation and management procedures were performed by the Advanced Practitioner under my supervision and collaboration.  I have reviewed the Advanced Practitioner's note and chart, and I agree with the management and plan.  Jacob Stinson, DO Attending Physician Faculty Practice, [facility]  

## 2014-08-11 ENCOUNTER — Telehealth: Payer: Self-pay | Admitting: General Practice

## 2014-08-11 ENCOUNTER — Encounter (HOSPITAL_COMMUNITY): Payer: Self-pay | Admitting: *Deleted

## 2014-08-11 ENCOUNTER — Inpatient Hospital Stay (HOSPITAL_COMMUNITY)
Admission: AD | Admit: 2014-08-11 | Discharge: 2014-08-11 | Disposition: A | Discharge status: Home / Self Care | Payer: Medicaid Other | Source: Ambulatory Visit | Attending: Obstetrics & Gynecology | Admitting: Obstetrics & Gynecology

## 2014-08-11 ENCOUNTER — Telehealth: Payer: Self-pay

## 2014-08-11 DIAGNOSIS — O021 Missed abortion: Secondary | ICD-10-CM | POA: Diagnosis not present

## 2014-08-11 DIAGNOSIS — O209 Hemorrhage in early pregnancy, unspecified: Secondary | ICD-10-CM | POA: Diagnosis present

## 2014-08-11 LAB — URINALYSIS, ROUTINE W REFLEX MICROSCOPIC
BILIRUBIN URINE: NEGATIVE
GLUCOSE, UA: NEGATIVE mg/dL
Ketones, ur: NEGATIVE mg/dL
Leukocytes, UA: NEGATIVE
NITRITE: NEGATIVE
PH: 7.5 (ref 5.0–8.0)
Protein, ur: NEGATIVE mg/dL
Specific Gravity, Urine: 1.015 (ref 1.005–1.030)
Urobilinogen, UA: 0.2 mg/dL (ref 0.0–1.0)

## 2014-08-11 LAB — CBC
HCT: 30.4 % — ABNORMAL LOW (ref 36.0–46.0)
HEMOGLOBIN: 10.5 g/dL — AB (ref 12.0–15.0)
MCH: 28.8 pg (ref 26.0–34.0)
MCHC: 34.5 g/dL (ref 30.0–36.0)
MCV: 83.3 fL (ref 78.0–100.0)
Platelets: 205 10*3/uL (ref 150–400)
RBC: 3.65 MIL/uL — AB (ref 3.87–5.11)
RDW: 12.7 % (ref 11.5–15.5)
WBC: 7.8 10*3/uL (ref 4.0–10.5)

## 2014-08-11 LAB — URINE MICROSCOPIC-ADD ON

## 2014-08-11 MED ORDER — PROMETHAZINE HCL 25 MG PO TABS: 25.0000 mg | ORAL_TABLET | Freq: Four times a day (QID) | ORAL | Status: DC | PRN

## 2014-08-11 MED ORDER — HYDROMORPHONE HCL 1 MG/ML IJ SOLN
1.0000 mg | Freq: Once | INTRAMUSCULAR | Status: AC
  Administered 2014-08-11: 1 mg via INTRAMUSCULAR

## 2014-08-11 MED ORDER — MISOPROSTOL 200 MCG PO TABS: 600.0000 ug | ORAL_TABLET | Freq: Once | ORAL | Status: DC

## 2014-08-11 MED ORDER — HYDROMORPHONE HCL 1 MG/ML IJ SOLN
1.0000 mg | Freq: Once | INTRAMUSCULAR | Status: DC
  Filled 2014-08-11: qty 1

## 2014-08-11 MED ORDER — OXYCODONE-ACETAMINOPHEN 5-325 MG PO TABS
2.0000 | ORAL_TABLET | ORAL | Status: DC | PRN
Start: 2014-08-11 — End: 2015-10-20

## 2014-08-11 MED ORDER — IBUPROFEN 800 MG PO TABS: 800.0000 mg | ORAL_TABLET | Freq: Three times a day (TID) | ORAL | Status: DC

## 2014-08-11 MED ORDER — MISOPROSTOL 200 MCG PO TABS: 800.0000 ug | ORAL_TABLET | Freq: Once | ORAL | Status: DC

## 2014-08-11 NOTE — Telephone Encounter (Signed)
Patient took cytotec today, 9/30. Call patient in a couple days to see if she has passed tissue/had bleeding

## 2014-08-11 NOTE — MAU Provider Note (Signed)
History     CSN: [id number]  Arrival date and time: 08/11/14 8206   None     Chief Complaint  Patient presents with  . Vaginal Bleeding  . Abdominal Cramping   HPI  [name] is a 23 y.o. female G3P1011 at [redacted]w[redacted]d. She is here today with vaginal bleeding and abdominal cramping. She has been followed in MAU for quants and [place] with abnormal growth and progression of this pregnancy. Last US showed no increase in gestational sac and was identified as a failed pregnancy. She is scheduled for a D&C on Oct 8th. She is here today with vaginal bleeding and pain that she rates 8/10. She has taken tylenol without relief. She is having light bleeding that is similar to a menstrual cycle for her.   OB History   Grav Para Term Preterm Abortions TAB SAB Ect Mult Living   3 1 1  1 1    1       Past Medical History  Diagnosis Date  . Medical history non-contributory     Past Surgical History  Procedure Laterality Date  . Tonsillectomy    . Adenoidectomy    . Cesarean section      Family History  Problem Relation Age of Onset  . Hypertension Mother   . Diabetes Maternal Grandmother   . Heart disease Maternal Grandmother     History  Substance Use Topics  . Smoking status: Current Every Day Smoker -- 0.50 packs/day    Types: Cigarettes  . Smokeless tobacco: Never Used  . Alcohol Use: Yes     Comment: occasional    Allergies: No Known Allergies  Prescriptions prior to admission  Medication Sig Dispense Refill  . acetaminophen (TYLENOL) 500 MG tablet Take 1,000 mg by mouth every 6 (six) hours as needed for mild pain.        Results for orders placed during the hospital encounter of 08/11/14 (from the past 48 hour(s))  URINALYSIS, ROUTINE W REFLEX MICROSCOPIC     Status: Abnormal   Collection Time    08/11/14  9:40 AM      Result Value Ref Range   Color, Urine YELLOW  YELLOW   APPearance HAZY (*) CLEAR   Specific Gravity, Urine 1.015  1.005 - 1.030   pH 7.5  5.0 -  8.0   Glucose, UA NEGATIVE  NEGATIVE mg/dL   Hgb urine dipstick LARGE (*) NEGATIVE   Bilirubin Urine NEGATIVE  NEGATIVE   Ketones, ur NEGATIVE  NEGATIVE mg/dL   Protein, ur NEGATIVE  NEGATIVE mg/dL   Urobilinogen, UA 0.2  0.0 - 1.0 mg/dL   Nitrite NEGATIVE  NEGATIVE   Leukocytes, UA NEGATIVE  NEGATIVE  URINE MICROSCOPIC-ADD ON     Status: None   Collection Time    08/11/14  9:40 AM      Result Value Ref Range   Squamous Epithelial / LPF RARE  RARE   WBC, UA 0-2  <3 WBC/hpf  CBC     Status: Abnormal   Collection Time    08/11/14 11:05 AM      Result Value Ref Range   WBC 7.8  4.0 - 10.5 K/uL   RBC 3.65 (*) 3.87 - 5.11 MIL/uL   Hemoglobin 10.5 (*) 12.0 - 15.0 g/dL   HCT 01.5 (*) 61.5 - 37.9 %   MCV 83.3  78.0 - 100.0 fL   MCH 28.8  26.0 - 34.0 pg   MCHC 34.5  30.0 - 36.0 g/dL  RDW 12.7  11.5 - 15.5 %   Platelets 205  150 - 400 K/uL   CLINICAL DATA: Followup viability.  EXAM:  TRANSVAGINAL OB ULTRASOUND  TECHNIQUE:  Transvaginal ultrasound was performed for complete evaluation of the  gestation as well as the maternal uterus, adnexal regions, and  pelvic cul-de-sac.  COMPARISON: 07/21/2014 and 07/09/2014.  FINDINGS:  Intrauterine gestational sac: Single intrauterine fluid collection  is identified  Yolk sac: No  Embryo: No  Cardiac Activity: No  Heart Rate: Not up bpm  MSD: 1.2 cm mm 6 w 0 d US EDC: 03/30/2015.  Maternal uterus/adnexae:  Subchorionic hemorrhage: None  Right ovary: Probable resolving corpus luteal cyst noted within the  right ovary.  Left ovary: Appears normal  Other :None  Free fluid: None  IMPRESSION:  1. Single intrauterine fluid collection without evidence for yolk  sac or embryo. Findings meet definitive criteria for failed  pregnancy. This follows SRU consensus guidelines: Diagnostic  Criteria for Nonviable Pregnancy Early in the First Trimester. Macy Mis  Engl J Med [id number].  Electronically Signed  By: Signa Kellaylor Stroud M.D.  On:  08/04/2014 11:41   Review of Systems  Gastrointestinal: Positive for abdominal pain (Lower abdominal pain ). Negative for nausea and vomiting.  Genitourinary:       +vaginal bleeding    Physical Exam   Blood pressure 105/56, pulse 83, temperature 98.6 F (37 C), temperature source Oral, resp. rate 18, last menstrual period 05/25/2014.  Physical Exam  Constitutional: She is oriented to person, place, and time. She appears well-developed and well-nourished. No distress.  HENT:  Head: Normocephalic.  Eyes: Pupils are equal, round, and reactive to light.  Neck: Neck supple.  Respiratory: Effort normal.  GI: Soft. She exhibits no distension. There is no tenderness. There is no rebound and no guarding.  Genitourinary:  Speculum exam: Vagina - Small amount of dark red blood in vaginal canal. Mild odor. Cervix - + contact bleeding  Bimanual exam: Cervix closed Chaperone present for exam.   Musculoskeletal: Normal range of motion.  Neurological: She is alert and oriented to person, place, and time.  Skin: Skin is warm. She is not diaphoretic.  Psychiatric: Her behavior is normal.    MAU Course  Procedures None  MDM CBC  O positive blood type Discussed the use of cytotec and patient is agreeable. Patient would like to insert the cytotec herself at home.   Assessment and Plan   A:  1. Missed abortion     P:  -Discharge home in stable condition -Message sent to the clinic regarding cytotec today. Patient may not need D&C. Spoke to ThompsonAntoinette in the clinic; she will call the patient in 24 hours to assess how the patient is doing. If no passage of tissue patient may need second dose of cytotec or keep her scheduled D&C -Bleeding precautions discussed at length RX: -Cytotec 800 mcg per rectum        -Phenergan        -Percocet        -Ibuprofen  Return to MAU if symptoms worsen Support given    Gwendolyn HansenJennifer Irene Rasch, NP 08/11/2014 2:34 PM

## 2014-08-11 NOTE — Telephone Encounter (Signed)
Message copied by Kathee Delton on Wed Aug 11, 2014  4:22 PM ------      Message from: Mayra Neer P      Created: Wed Aug 11, 2014  1:29 PM      Regarding: Clinical Pool please follow up       Clinical pool,      Please see below message and follow up with patient.              Admin pool,      Just an FYI for you.  If we need to move appointment, will let you know.            Thanks,      Tresa Endo      ----- Message -----         From: Iona Hansen Rasch, NP         Sent: 08/11/2014  11:10 AM           To: Juliette Mangle, RN, [facility] Admin Pool            Patient is scheduled for [facility] on Oct 8th for missed AB            She is here today with bleeding and pain>cytotec given. Please call the patient this week and find out if she passed tissue            She made need to follow up in a week and she may need to cancel D&C.             Thank you.        ------

## 2014-08-11 NOTE — MAU Note (Signed)
C/o vaginal bleeding and cramping for past 3 days;

## 2014-08-11 NOTE — Telephone Encounter (Signed)
Patient called stating she was seen in MAU today and given Cytotec-- was told to insert rectally because she was already bleeding. Patient states she inserted medication less than 10 minutes ago and now feels like she has to have a bowel movement-- would like to know what she should do. Discussed patient with Dr. Adrian BlackwaterStinson who states he will order another dose of Cytotec 600mcg for patient to put under tongue or between cheek and gum until it dissolves. Informed patient of this-- also stated if she does not actually have bowel movement within the next hour then she does not need to retake medication. Patient verbalized understanding and gratitude. No further questions or concerns.

## 2014-08-11 NOTE — MAU Note (Signed)
Scheduled for a D&C on October 8th; no embryo noted on u/s - only a gestational sac;

## 2014-08-12 ENCOUNTER — Inpatient Hospital Stay (HOSPITAL_COMMUNITY)
Admission: AD | Admit: 2014-08-12 | Discharge: 2014-08-12 | Disposition: A | Discharge status: Home / Self Care | Payer: Medicaid Other | Source: Ambulatory Visit | Attending: Obstetrics & Gynecology | Admitting: Obstetrics & Gynecology

## 2014-08-12 ENCOUNTER — Encounter (HOSPITAL_COMMUNITY): Payer: Self-pay | Admitting: *Deleted

## 2014-08-12 DIAGNOSIS — Z3A10 10 weeks gestation of pregnancy: Secondary | ICD-10-CM | POA: Diagnosis not present

## 2014-08-12 DIAGNOSIS — O209 Hemorrhage in early pregnancy, unspecified: Secondary | ICD-10-CM | POA: Diagnosis not present

## 2014-08-12 NOTE — MAU Provider Note (Signed)
Attestation of Attending Supervision of Advanced Practitioner (PA/CNM/NP): Evaluation and management procedures were performed by the Advanced Practitioner under my supervision and collaboration.  I have reviewed the Advanced Practitioner's note and chart, and I agree with the management and plan.  Carri Spillers, MD, FACOG Attending Obstetrician & Gynecologist Faculty Practice, [facility]   

## 2014-08-12 NOTE — MAU Note (Signed)
NOT IN LOBBY 

## 2014-08-12 NOTE — MAU Note (Signed)
PT  SAYS HE WAS HERE YESTERDAY- AM .  NOW D/C  IS SCH FOR 10-8.    WAS BLEEDING HEAVY  - GAVE HER CYTOTEC.  CRAMPING  IS WORSE  TODAY-   8PM.   HAS PAD ON - SAYS BLEEDING IS HEAVY-   PAD IN TRIAGE-   SMALL AMT  - LIGHT RED.   TOOK  PERCOCET  1 TAB AT  530PM.   TOOK IBUPROFEN AT 1 PM

## 2014-08-16 NOTE — Telephone Encounter (Signed)
Called patient, no answer- left message that we are trying to reach you, please call us back at the clinics 

## 2014-08-17 NOTE — Telephone Encounter (Signed)
[name] called back and wanted to speak to a nurse because she had gotten our messages we were trying to reach her.   [name] reports she is still having bleeding and passing clots that" get stuck at times, but then come out later". Instructed her to keep her appointment as scheduled in clinic 08/19/14 to discuss with provider if needs  D&C or not unless we call her and tell her differently.  Called [name] and discussed with him. No new orders, keep appointment as scheduled.  [name] voices understanding.

## 2014-08-17 NOTE — Telephone Encounter (Signed)
Attempted to contact patient. No answer. Left message stating we are trying to get in touch with you, please call clinic.

## 2014-08-19 ENCOUNTER — Ambulatory Visit (INDEPENDENT_AMBULATORY_CARE_PROVIDER_SITE_OTHER): Payer: Medicaid Other | Admitting: Obstetrics & Gynecology

## 2014-08-19 ENCOUNTER — Telehealth: Payer: Self-pay | Admitting: General Practice

## 2014-08-19 VITALS — BP 111/61 | HR 83 | Temp 98.4°F | Wt 135.7 lb

## 2014-08-19 DIAGNOSIS — O039 Complete or unspecified spontaneous abortion without complication: Secondary | ICD-10-CM

## 2014-08-20 ENCOUNTER — Encounter: Payer: Self-pay | Admitting: Obstetrics & Gynecology

## 2014-08-20 ENCOUNTER — Encounter: Payer: Medicaid Other | Admitting: Obstetrics & Gynecology

## 2014-08-20 DIAGNOSIS — O039 Complete or unspecified spontaneous abortion without complication: Secondary | ICD-10-CM | POA: Insufficient documentation

## 2014-08-20 NOTE — Telephone Encounter (Signed)
Opened in error

## 2014-08-20 NOTE — Patient Instructions (Signed)
Miscarriage A miscarriage is the sudden loss of an unborn baby (fetus) before the 20th week of pregnancy. Most miscarriages happen in the first 3 months of pregnancy. Sometimes, it happens before a woman even knows she is pregnant. A miscarriage is also called a "spontaneous miscarriage" or "early pregnancy loss." Having a miscarriage can be an emotional experience. Talk with your caregiver about any questions you may have about miscarrying, the grieving process, and your future pregnancy plans. CAUSES   Problems with the fetal chromosomes that make it impossible for the baby to develop normally. Problems with the baby's genes or chromosomes are most often the result of errors that occur, by chance, as the embryo divides and grows. The problems are not inherited from the parents.  Infection of the cervix or uterus.   Hormone problems.   Problems with the cervix, such as having an incompetent cervix. This is when the tissue in the cervix is not strong enough to hold the pregnancy.   Problems with the uterus, such as an abnormally shaped uterus, uterine fibroids, or congenital abnormalities.   Certain medical conditions.   Smoking, drinking alcohol, or taking illegal drugs.   Trauma.  Often, the cause of a miscarriage is unknown.  SYMPTOMS   Vaginal bleeding or spotting, with or without cramps or pain.  Pain or cramping in the abdomen or lower back.  Passing fluid, tissue, or blood clots from the vagina. DIAGNOSIS  Your caregiver will perform a physical exam. You may also have an ultrasound to confirm the miscarriage. Blood or urine tests may also be ordered. TREATMENT   Sometimes, treatment is not necessary if you naturally pass all the fetal tissue that was in the uterus. If some of the fetus or placenta remains in the body (incomplete miscarriage), tissue left behind may become infected and must be removed. Usually, a dilation and curettage (D and C) procedure is performed.  During a D and C procedure, the cervix is widened (dilated) and any remaining fetal or placental tissue is gently removed from the uterus.  Antibiotic medicines are prescribed if there is an infection. Other medicines may be given to reduce the size of the uterus (contract) if there is a lot of bleeding.  If you have Rh negative blood and your baby was Rh positive, you will need a Rh immunoglobulin shot. This shot will protect any future baby from having Rh blood problems in future pregnancies. HOME CARE INSTRUCTIONS   Your caregiver may order bed rest or may allow you to continue light activity. Resume activity as directed by your caregiver.  Have someone help with home and family responsibilities during this time.   Keep track of the number of sanitary pads you use each day and how soaked (saturated) they are. Write down this information.   Do not use tampons. Do not douche or have sexual intercourse until approved by your caregiver.   Only take over-the-counter or prescription medicines for pain or discomfort as directed by your caregiver.   Do not take aspirin. Aspirin can cause bleeding.   Keep all follow-up appointments with your caregiver.   If you or your partner have problems with grieving, talk to your caregiver or seek counseling to help cope with the pregnancy loss. Allow enough time to grieve before trying to get pregnant again.  SEEK IMMEDIATE MEDICAL CARE IF:   You have severe cramps or pain in your back or abdomen.  You have a fever.  You pass large blood clots (walnut-sized   or larger) ortissue from your vagina. Save any tissue for your caregiver to inspect.   Your bleeding increases.   You have a thick, bad-smelling vaginal discharge.  You become lightheaded, weak, or you faint.   You have chills.  MAKE SURE YOU:  Understand these instructions.  Will watch your condition.  Will get help right away if you are not doing well or get  worse. Document Released: 04/24/2001 Document Revised: 02/23/2013 Document Reviewed: 12/18/2011 ExitCare Patient Information 2015 [employer]. This information is not intended to replace advice given to you by your health care provider. Make sure you discuss any questions you have with your health care provider.  

## 2014-08-20 NOTE — Progress Notes (Signed)
Patient ID: [name], female   DOB: 1990-12-29, 23 y.o.   MRN: [id number]  Chief Complaint  Patient presents with  . Follow-up    HPI [name] is a 23 y.o. female.  MAU f/u after cytotec given for incomplete abortion. She passed tissue and has no pain or bleeding now. Does not want BCM. [id number]   HPI  Past Medical History  Diagnosis Date  . Medical history non-contributory     Past Surgical History  Procedure Laterality Date  . Tonsillectomy    . Adenoidectomy    . Cesarean section      Family History  Problem Relation Age of Onset  . Hypertension Mother   . Diabetes Maternal Grandmother   . Heart disease Maternal Grandmother     Social History History  Substance Use Topics  . Smoking status: Current Every Day Smoker -- 0.50 packs/day    Types: Cigarettes  . Smokeless tobacco: Never Used  . Alcohol Use: Yes     Comment: occasional    No Known Allergies  Current Outpatient Prescriptions  Medication Sig Dispense Refill  . acetaminophen (TYLENOL) 500 MG tablet Take 1,000 mg by mouth every 6 (six) hours as needed for mild pain.       Marland Kitchen ibuprofen (ADVIL,MOTRIN) 800 MG tablet Take 1 tablet (800 mg total) by mouth 3 (three) times daily.  21 tablet  0  . misoprostol (CYTOTEC) 200 MCG tablet Place 4 tablets (800 mcg total) rectally once.  4 tablet  0  . misoprostol (CYTOTEC) 200 MCG tablet Take 3 tablets (600 mcg total) by mouth once. Place 3 tablets under tongue or between cheek and gum until dissolved.  3 tablet  0  . oxyCODONE-acetaminophen (PERCOCET/ROXICET) 5-325 MG per tablet Take 2 tablets by mouth every 4 (four) hours as needed for severe pain.  15 tablet  0  . promethazine (PHENERGAN) 25 MG tablet Take 1 tablet (25 mg total) by mouth every 6 (six) hours as needed for nausea or vomiting.  30 tablet  0   No current facility-administered medications for this visit.    Review of Systems Review of Systems  Constitutional: Negative.   Genitourinary:  Negative for vaginal bleeding, vaginal discharge and menstrual problem.    Blood pressure 111/61, pulse 83, temperature 98.4 F (36.9 C), weight 135 lb 11.2 oz (61.553 kg), last menstrual period 05/25/2014.  Physical Exam Physical Exam  Constitutional: She is oriented to person, place, and time. She appears well-developed. No distress.  Pulmonary/Chest: Effort normal. No respiratory distress.  Abdominal: She exhibits no mass. There is no tenderness.  Neurological: She is alert and oriented to person, place, and time.  Psychiatric: She has a normal mood and affect. Her behavior is normal.    Data Reviewed MAU note  Assessment    S/p complete abortion      Plan    Expectant management, continue PNV and report if if she conceives        Mindi Akerson 08/20/2014, 12:00 PM

## 2014-09-13 ENCOUNTER — Encounter: Payer: Self-pay | Admitting: Obstetrics & Gynecology

## 2014-10-22 IMAGING — US US OB TRANSVAGINAL
1 series · 14 of 28 positions shown · non-contrast
Comparison: 07/21/2014 and 07/09/2014.

CLINICAL DATA: Followup viability.

EXAM:
TRANSVAGINAL OB ULTRASOUND
TECHNIQUE: Transvaginal ultrasound was performed for complete evaluation of the
gestation as well as the maternal uterus, adnexal regions, and
pelvic cul-de-sac.

[Series 1: us ob transvaginal · 54 acquisitions, 14 frames shown]
[im 2/54]
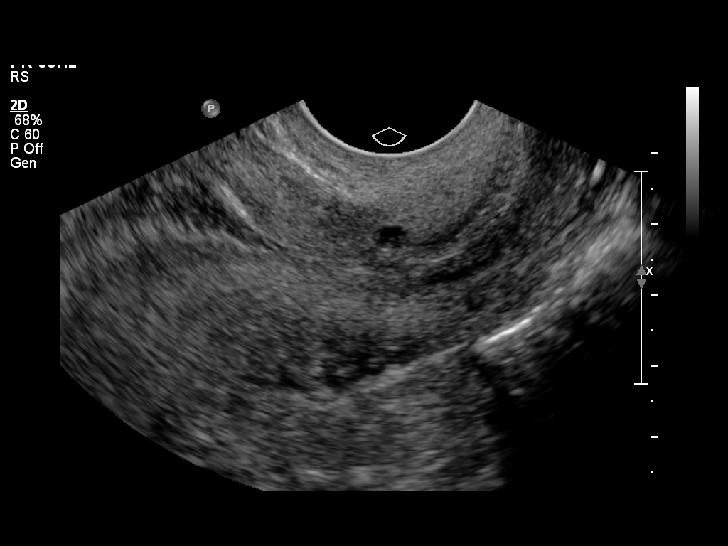
[im 6/54]
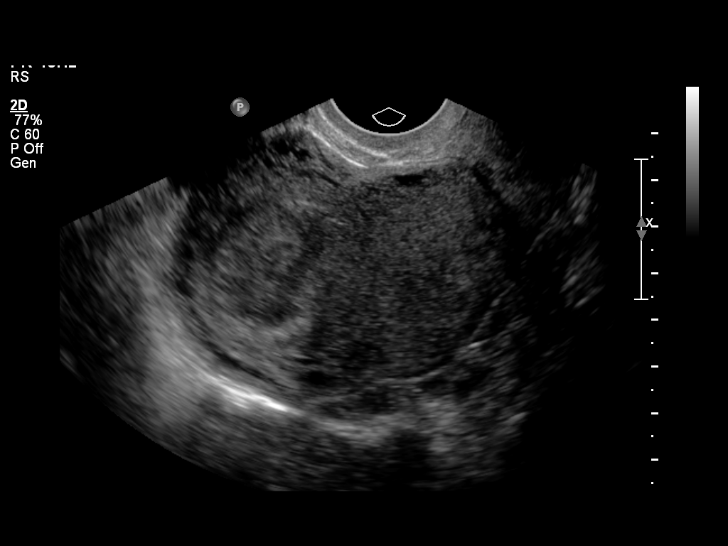
[im 10/54]
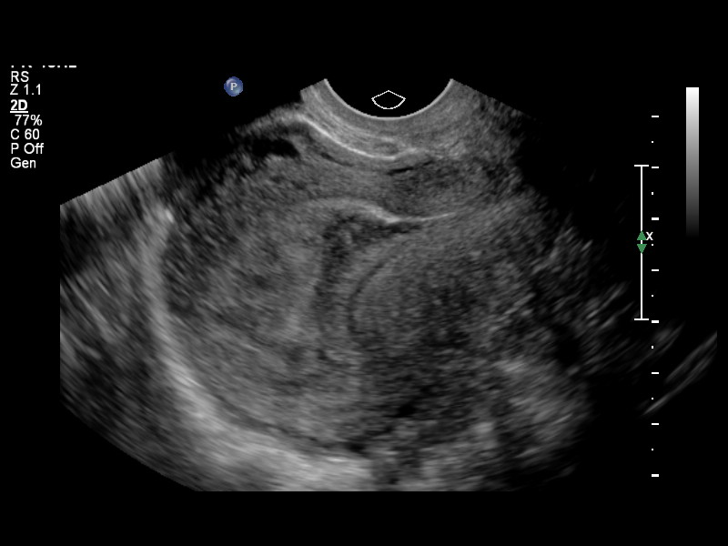
[im 14/54]
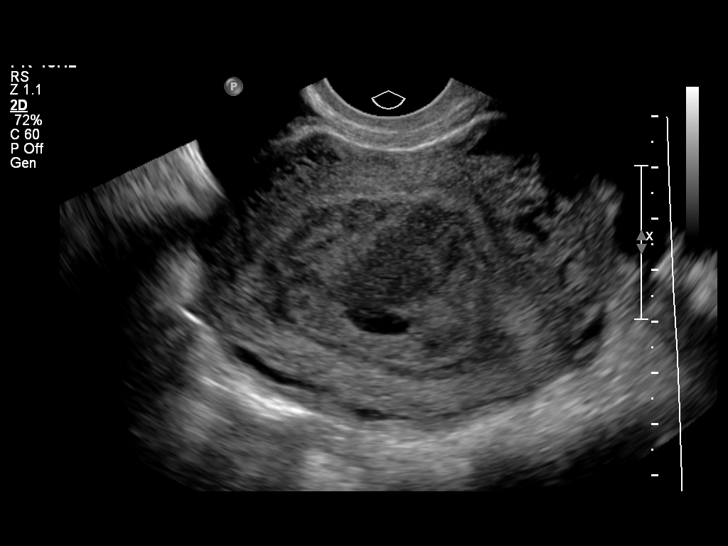
[im 18/54]
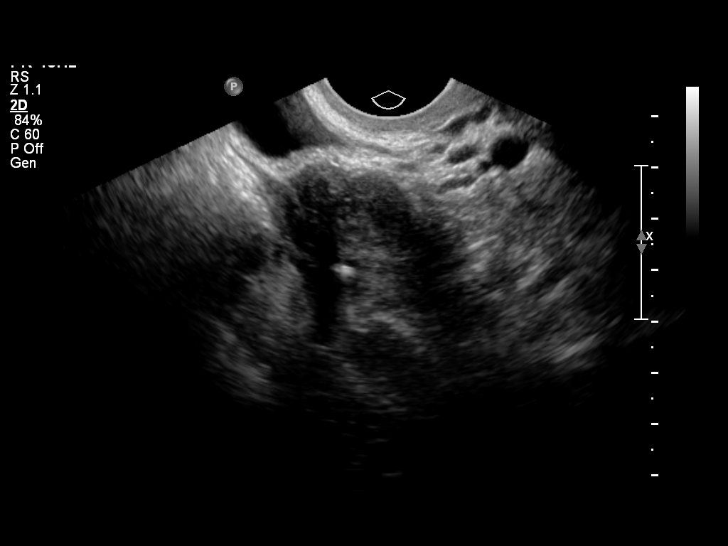
[im 22/54]
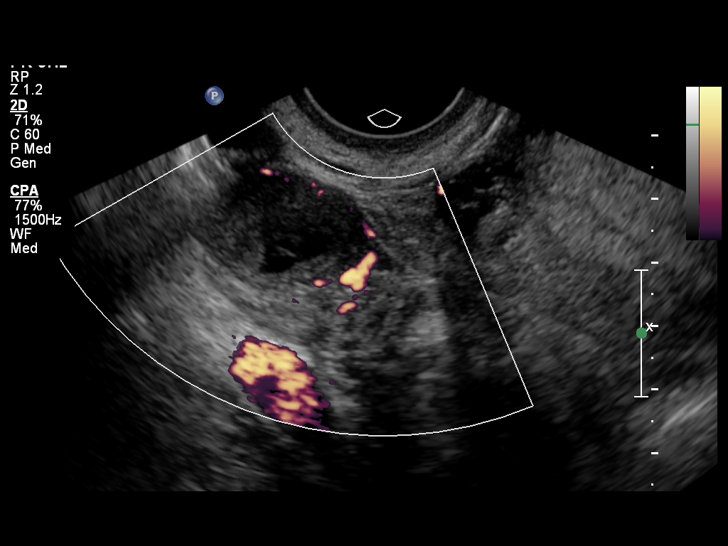
[im 26/54]
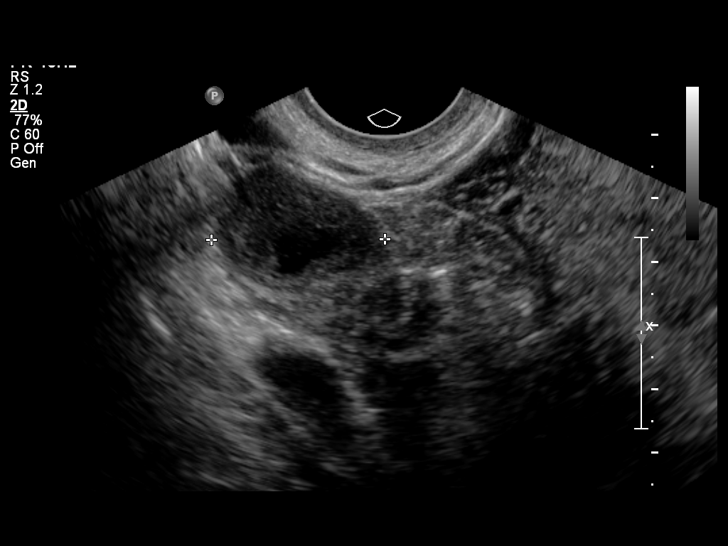
[im 30/54]
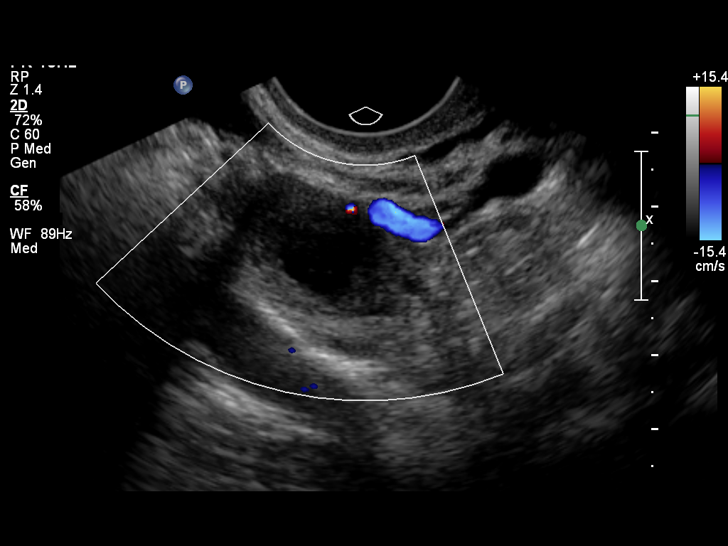
[im 34/54]
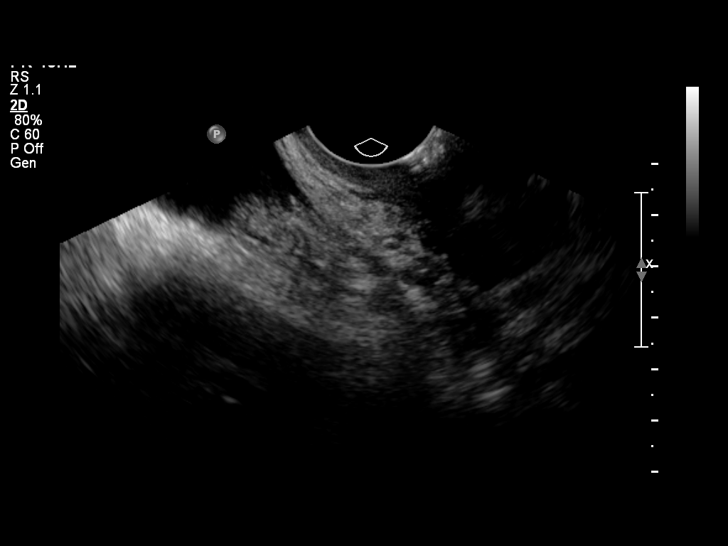
[im 38/54]
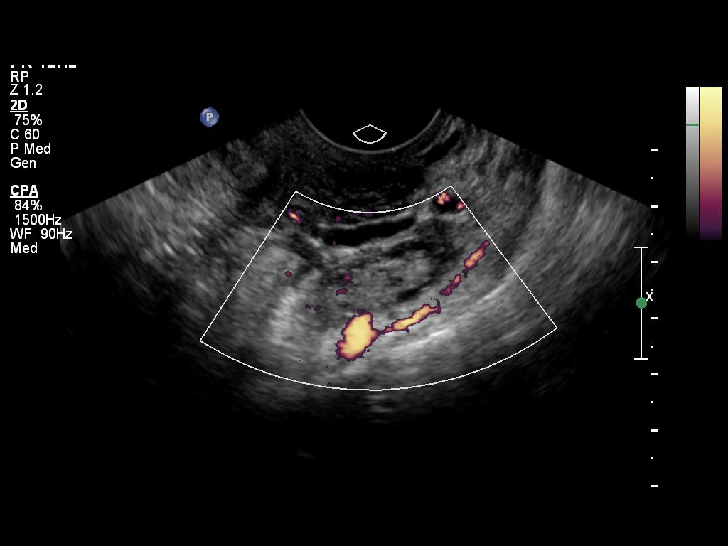
[im 42/54]
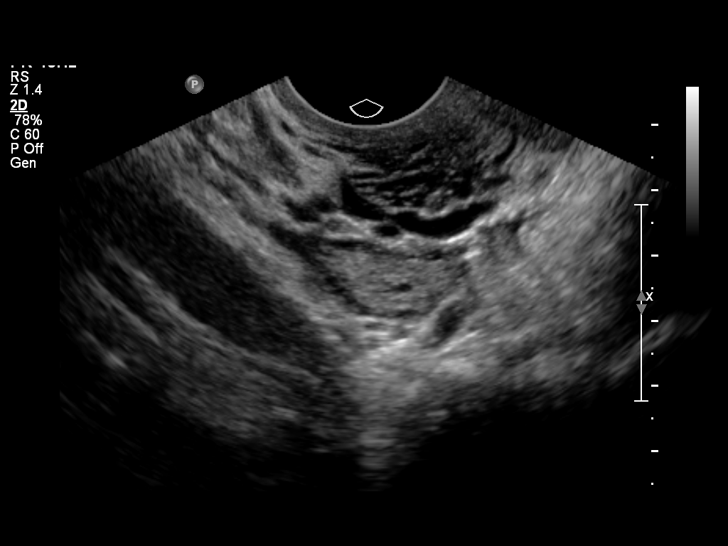
[im 46/54]
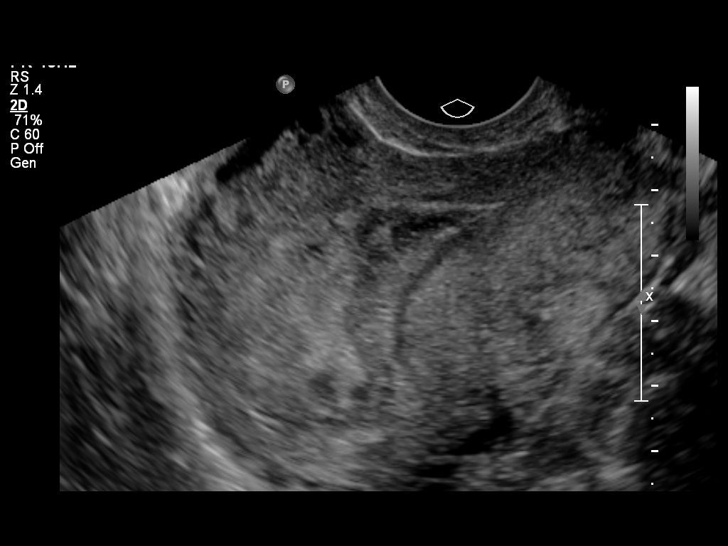
[im 50/54]
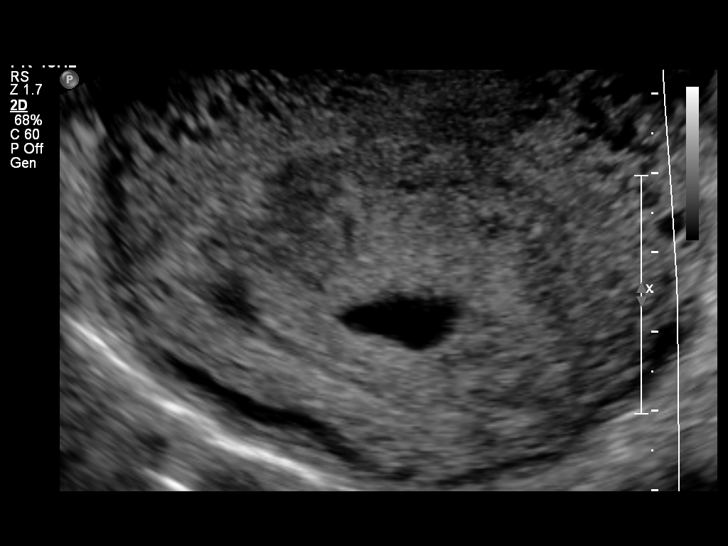
[im 54/54]
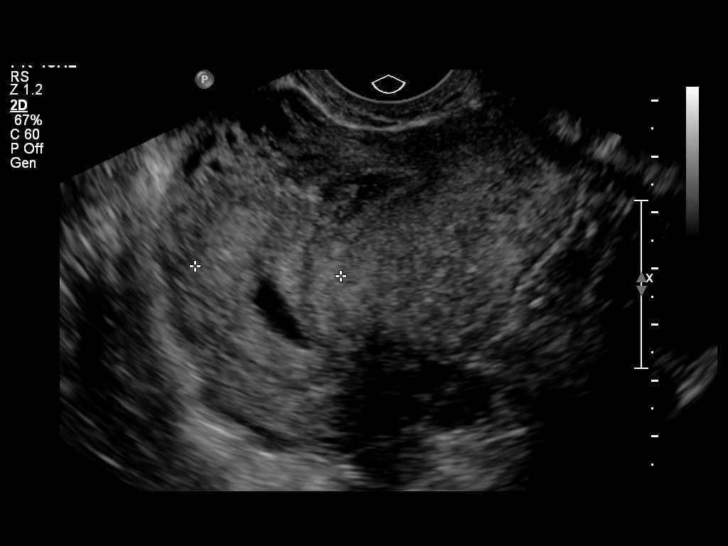

[14 of 28 positions shown; findings below may reference images not displayed]

FINDINGS: Intrauterine gestational sac: Single intrauterine fluid collection
is identified

Yolk sac:  No

Embryo:  No

Cardiac Activity: No

Heart Rate: Not up bpm

MSD: 1.2 cm  mm  6  w   0  d      US EDC: 03/30/2015.

Maternal uterus/adnexae:

Subchorionic hemorrhage: None

Right ovary: Probable resolving corpus luteal cyst noted within the
right ovary.

Left ovary: Appears normal

Other :None

Free fluid:  None
IMPRESSION: 1. Single intrauterine fluid collection without evidence for yolk
sac or embryo. Findings meet definitive criteria for failed
pregnancy. This follows SRU consensus guidelines: Diagnostic
Criteria for Nonviable Pregnancy Early in the First Trimester. N
Engl J Med 4151;[DATE].

## 2015-04-29 ENCOUNTER — Encounter (HOSPITAL_COMMUNITY): Payer: Self-pay | Admitting: *Deleted

## 2015-06-16 LAB — OB RESULTS CONSOLE HIV ANTIBODY (ROUTINE TESTING): HIV: NONREACTIVE

## 2015-06-16 LAB — OB RESULTS CONSOLE RPR: RPR: NONREACTIVE

## 2015-06-16 LAB — OB RESULTS CONSOLE RUBELLA ANTIBODY, IGM: RUBELLA: IMMUNE

## 2015-06-16 LAB — OB RESULTS CONSOLE HEPATITIS B SURFACE ANTIGEN: HEP B S AG: NEGATIVE

## 2015-06-30 ENCOUNTER — Other Ambulatory Visit: Payer: Self-pay | Admitting: Obstetrics and Gynecology

## 2015-06-30 LAB — OB RESULTS CONSOLE GC/CHLAMYDIA
Chlamydia: NEGATIVE
Gonorrhea: NEGATIVE

## 2015-07-01 LAB — CYTOLOGY - PAP

## 2015-08-17 ENCOUNTER — Encounter (HOSPITAL_COMMUNITY): Payer: Self-pay | Admitting: Medical

## 2015-08-17 ENCOUNTER — Inpatient Hospital Stay (HOSPITAL_COMMUNITY)
Admission: AD | Admit: 2015-08-17 | Discharge: 2015-08-17 | Disposition: A | Discharge status: Home / Self Care | Payer: Medicaid Other | Source: Ambulatory Visit | Attending: Obstetrics and Gynecology | Admitting: Obstetrics and Gynecology

## 2015-08-17 DIAGNOSIS — F17210 Nicotine dependence, cigarettes, uncomplicated: Secondary | ICD-10-CM | POA: Insufficient documentation

## 2015-08-17 DIAGNOSIS — O99332 Smoking (tobacco) complicating pregnancy, second trimester: Secondary | ICD-10-CM | POA: Insufficient documentation

## 2015-08-17 DIAGNOSIS — Z3A17 17 weeks gestation of pregnancy: Secondary | ICD-10-CM | POA: Insufficient documentation

## 2015-08-17 DIAGNOSIS — N949 Unspecified condition associated with female genital organs and menstrual cycle: Secondary | ICD-10-CM

## 2015-08-17 DIAGNOSIS — O26892 Other specified pregnancy related conditions, second trimester: Secondary | ICD-10-CM | POA: Diagnosis not present

## 2015-08-17 DIAGNOSIS — R1032 Left lower quadrant pain: Secondary | ICD-10-CM | POA: Insufficient documentation

## 2015-08-17 LAB — URINALYSIS, ROUTINE W REFLEX MICROSCOPIC
Bilirubin Urine: NEGATIVE
Glucose, UA: NEGATIVE mg/dL
Hgb urine dipstick: NEGATIVE
KETONES UR: NEGATIVE mg/dL
Leukocytes, UA: NEGATIVE
NITRITE: NEGATIVE
PROTEIN: NEGATIVE mg/dL
Specific Gravity, Urine: 1.02 (ref 1.005–1.030)
Urobilinogen, UA: 0.2 mg/dL (ref 0.0–1.0)
pH: 8 (ref 5.0–8.0)

## 2015-08-17 NOTE — MAU Provider Note (Signed)
History     CSN: [id number]  Arrival date and time: 08/17/15 0717   First Provider Initiated Contact with Patient 08/17/15 [phone]      Chief Complaint  Patient presents with  . Abdominal Cramping   HPI  [name] is a 24 y.o. G4P1011 at [redacted]w[redacted]d who presents to MAU today with complaint of LLQ abdominal pain. The patient states that she has had intermittent abdominal pain x 1.5 weeks. She states that last night the pain became worse and made it difficult for her to sleep. She rates pain at 7/10 now. She has not tried anything for pain. She denies vaginal bleeding, discharge, UTI symptoms, N/V/D, constipation or fever. She also denies any complications with the pregnancy.   OB History    Gravida Para Term Preterm AB TAB SAB Ectopic Multiple Living   Past Medical History  Diagnosis Date  . Medical history non-contributory     Past Surgical History  Procedure Laterality Date  . Tonsillectomy    . Adenoidectomy    . Cesarean section      Family History  Problem Relation Age of Onset  . Hypertension Mother   . Diabetes Maternal Grandmother   . Heart disease Maternal Grandmother     Social History  Substance Use Topics  . Smoking status: Current Every Day Smoker -- 0.50 packs/day    Types: Cigarettes  . Smokeless tobacco: Never Used  . Alcohol Use: Yes     Comment: occasional    Allergies: No Known Allergies  Prescriptions prior to admission  Medication Sig Dispense Refill Last Dose  . acetaminophen (TYLENOL) 500 MG tablet Take 1,000 mg by mouth every 6 (six) hours as needed for mild pain.    Taking  . ibuprofen (ADVIL,MOTRIN) 800 MG tablet Take 1 tablet (800 mg total) by mouth 3 (three) times daily. 21 tablet 0 Taking  . misoprostol (CYTOTEC) 200 MCG tablet Place 4 tablets (800 mcg total) rectally once. 4 tablet 0 Not Taking  . misoprostol (CYTOTEC) 200 MCG tablet Take 3 tablets (600 mcg total) by mouth once. Place 3 tablets under tongue  or between cheek and gum until dissolved. 3 tablet 0 Not Taking  . oxyCODONE-acetaminophen (PERCOCET/ROXICET) 5-325 MG per tablet Take 2 tablets by mouth every 4 (four) hours as needed for severe pain. 15 tablet 0 Not Taking  . promethazine (PHENERGAN) 25 MG tablet Take 1 tablet (25 mg total) by mouth every 6 (six) hours as needed for nausea or vomiting. 30 tablet 0 Not Taking    Review of Systems  Constitutional: Negative for fever and malaise/fatigue.  Gastrointestinal: Positive for abdominal pain. Negative for nausea, vomiting, diarrhea and constipation.  Genitourinary: Negative for dysuria, urgency and frequency.       Neg - vaginal bleeding, discharge   Physical Exam   Blood pressure 125/64, pulse 78, temperature 98 F (36.7 C), temperature source Oral, resp. rate 16, last menstrual period 04/18/2015, unknown if currently breastfeeding.  Physical Exam  Nursing note and vitals reviewed. Constitutional: She is oriented to person, place, and time. She appears well-developed and well-nourished. No distress.  HENT:  Head: Normocephalic and atraumatic.  Cardiovascular: Normal rate.   Respiratory: Effort normal.  GI: Soft. She exhibits no distension and no mass. There is tenderness (mild tenderness to palpation of the LLQ). There is no rebound and no guarding.  Neurological: She is alert and oriented to person,  place, and time.  Skin: Skin is warm and dry. No erythema.  Psychiatric: She has a normal mood and affect.  Dilation: Closed Effacement (%): Thick Cervical Position: Posterior Station:  (high) Exam by:: Harlon Flor, PA-C   Results for orders placed or performed during the hospital encounter of 08/17/15 (from the past 24 hour(s))  Urinalysis, Routine w reflex microscopic (not at [facility])     Status: Abnormal   Collection Time: 08/17/15  7:32 AM  Result Value Ref Range   Color, Urine YELLOW YELLOW   APPearance HAZY (A) CLEAR   Specific Gravity, Urine 1.020 1.005 - 1.030   pH  8.0 5.0 - 8.0   Glucose, UA NEGATIVE NEGATIVE mg/dL   Hgb urine dipstick NEGATIVE NEGATIVE   Bilirubin Urine NEGATIVE NEGATIVE   Ketones, ur NEGATIVE NEGATIVE mg/dL   Protein, ur NEGATIVE NEGATIVE mg/dL   Urobilinogen, UA 0.2 0.0 - 1.0 mg/dL   Nitrite NEGATIVE NEGATIVE   Leukocytes, UA NEGATIVE NEGATIVE    MAU Course  Procedures None  MDM FHR - 150 bpm with doppler UA today without evidence of infection or dehydration Discussed with Dr. Renaldo Fiddler. Agrees with plan for discharge at this time. Follow-up as scheduled  Assessment and Plan  A: [facility]UP at [redacted]w[redacted]d Round ligament pain  P: Discharge home Tylenol PRN for pain advised Discussed use of abdominal binder and warm bath/shower Second trimester precautions discussed Patient advised to follow-up with [facility] as scheduled or sooner if symptoms worsen Patient may return to MAU as needed or if her condition were to change or worsen   Marny Lowenstein, PA-C  08/17/2015, 8:01 AM

## 2015-08-17 NOTE — Discharge Instructions (Signed)

## 2015-08-17 NOTE — MAU Note (Signed)
C/o abdominal pain on L lower side (beside pt's bellybutton); pain has been intermittent for past 2 weeks but has become progressively stronger and more constant;

## 2015-10-20 ENCOUNTER — Inpatient Hospital Stay (HOSPITAL_COMMUNITY): Payer: Managed Care, Other (non HMO)

## 2015-10-20 ENCOUNTER — Encounter (HOSPITAL_COMMUNITY): Payer: Self-pay | Admitting: *Deleted

## 2015-10-20 ENCOUNTER — Inpatient Hospital Stay (HOSPITAL_COMMUNITY)
Admission: AD | Admit: 2015-10-20 | Discharge: 2015-10-26 | DRG: 781 | Disposition: A | Discharge status: Home / Self Care | Payer: Managed Care, Other (non HMO) | Source: Ambulatory Visit | Attending: Obstetrics and Gynecology | Admitting: Obstetrics and Gynecology

## 2015-10-20 DIAGNOSIS — N760 Acute vaginitis: Secondary | ICD-10-CM

## 2015-10-20 DIAGNOSIS — O99332 Smoking (tobacco) complicating pregnancy, second trimester: Secondary | ICD-10-CM | POA: Diagnosis present

## 2015-10-20 DIAGNOSIS — Z8249 Family history of ischemic heart disease and other diseases of the circulatory system: Secondary | ICD-10-CM

## 2015-10-20 DIAGNOSIS — O23592 Infection of other part of genital tract in pregnancy, second trimester: Secondary | ICD-10-CM | POA: Diagnosis present

## 2015-10-20 DIAGNOSIS — Z3A26 26 weeks gestation of pregnancy: Secondary | ICD-10-CM

## 2015-10-20 DIAGNOSIS — Z833 Family history of diabetes mellitus: Secondary | ICD-10-CM | POA: Diagnosis not present

## 2015-10-20 DIAGNOSIS — F17210 Nicotine dependence, cigarettes, uncomplicated: Secondary | ICD-10-CM | POA: Diagnosis present

## 2015-10-20 DIAGNOSIS — O4692 Antepartum hemorrhage, unspecified, second trimester: Secondary | ICD-10-CM

## 2015-10-20 DIAGNOSIS — O468X2 Other antepartum hemorrhage, second trimester: Secondary | ICD-10-CM

## 2015-10-20 DIAGNOSIS — O4592 Premature separation of placenta, unspecified, second trimester: Principal | ICD-10-CM | POA: Diagnosis present

## 2015-10-20 DIAGNOSIS — B9689 Other specified bacterial agents as the cause of diseases classified elsewhere: Secondary | ICD-10-CM | POA: Diagnosis present

## 2015-10-20 DIAGNOSIS — R0602 Shortness of breath: Secondary | ICD-10-CM

## 2015-10-20 DIAGNOSIS — O4690 Antepartum hemorrhage, unspecified, unspecified trimester: Secondary | ICD-10-CM

## 2015-10-20 DIAGNOSIS — O418X20 Other specified disorders of amniotic fluid and membranes, second trimester, not applicable or unspecified: Secondary | ICD-10-CM

## 2015-10-20 DIAGNOSIS — O4590 Premature separation of placenta, unspecified, unspecified trimester: Secondary | ICD-10-CM | POA: Diagnosis present

## 2015-10-20 LAB — WET PREP, GENITAL
Sperm: NONE SEEN
TRICH WET PREP: NONE SEEN
YEAST WET PREP: NONE SEEN

## 2015-10-20 LAB — CBC WITH DIFFERENTIAL/PLATELET
BASOS ABS: 0 10*3/uL (ref 0.0–0.1)
BASOS PCT: 0 %
EOS ABS: 0 10*3/uL (ref 0.0–0.7)
EOS PCT: 0 %
HCT: 30.3 % — ABNORMAL LOW (ref 36.0–46.0)
Hemoglobin: 10.5 g/dL — ABNORMAL LOW (ref 12.0–15.0)
Lymphocytes Relative: 9 %
Lymphs Abs: 0.9 10*3/uL (ref 0.7–4.0)
MCH: 30.1 pg (ref 26.0–34.0)
MCHC: 34.7 g/dL (ref 30.0–36.0)
MCV: 86.8 fL (ref 78.0–100.0)
MONO ABS: 0.1 10*3/uL (ref 0.1–1.0)
Monocytes Relative: 1 %
Neutro Abs: 8.9 10*3/uL — ABNORMAL HIGH (ref 1.7–7.7)
Neutrophils Relative %: 90 %
PLATELETS: 243 10*3/uL (ref 150–400)
RBC: 3.49 MIL/uL — AB (ref 3.87–5.11)
RDW: 13.7 % (ref 11.5–15.5)
WBC: 10 10*3/uL (ref 4.0–10.5)

## 2015-10-20 LAB — URINALYSIS, ROUTINE W REFLEX MICROSCOPIC
BILIRUBIN URINE: NEGATIVE
GLUCOSE, UA: NEGATIVE mg/dL
HGB URINE DIPSTICK: NEGATIVE
Ketones, ur: 15 mg/dL — AB
Nitrite: NEGATIVE
Protein, ur: NEGATIVE mg/dL
SPECIFIC GRAVITY, URINE: 1.02 (ref 1.005–1.030)
pH: 7.5 (ref 5.0–8.0)

## 2015-10-20 LAB — URINE MICROSCOPIC-ADD ON: RBC / HPF: NONE SEEN RBC/hpf (ref 0–5)

## 2015-10-20 LAB — TYPE AND SCREEN
ABO/RH(D): O POS
Antibody Screen: NEGATIVE

## 2015-10-20 MED ORDER — METRONIDAZOLE 500 MG PO TABS
500.0000 mg | ORAL_TABLET | Freq: Two times a day (BID) | ORAL | Status: DC
  Administered 2015-10-20 – 2015-10-26 (×12): 500 mg via ORAL
  Filled 2015-10-20 (×15): qty 1

## 2015-10-20 MED ORDER — BETAMETHASONE SOD PHOS & ACET 6 (3-3) MG/ML IJ SUSP: 12.0000 mg | Freq: Every day | INTRAMUSCULAR | Status: DC

## 2015-10-20 MED ORDER — BETAMETHASONE SOD PHOS & ACET 6 (3-3) MG/ML IJ SUSP
12.0000 mg | Freq: Once | INTRAMUSCULAR | Status: AC
  Administered 2015-10-21: 12 mg via INTRAMUSCULAR
  Filled 2015-10-20 (×2): qty 2

## 2015-10-20 MED ORDER — BETAMETHASONE SOD PHOS & ACET 6 (3-3) MG/ML IJ SUSP
12.0000 mg | Freq: Once | INTRAMUSCULAR | Status: AC
  Administered 2015-10-20: 12 mg via INTRAMUSCULAR
  Filled 2015-10-20: qty 2

## 2015-10-20 MED ORDER — ZOLPIDEM TARTRATE 5 MG PO TABS: 5.0000 mg | ORAL_TABLET | Freq: Every evening | ORAL | Status: DC | PRN

## 2015-10-20 MED ORDER — CALCIUM CARBONATE ANTACID 500 MG PO CHEW
2.0000 | CHEWABLE_TABLET | ORAL | Status: DC | PRN
  Administered 2015-10-24 – 2015-10-25 (×2): 400 mg via ORAL
  Filled 2015-10-20 (×2): qty 2

## 2015-10-20 MED ORDER — PRENATAL MULTIVITAMIN CH
1.0000 | ORAL_TABLET | Freq: Every day | ORAL | Status: DC
Start: 2015-10-20 — End: 2015-10-26
  Administered 2015-10-20 – 2015-10-26 (×7): 1 via ORAL
  Filled 2015-10-20 (×7): qty 1

## 2015-10-20 MED ORDER — ACETAMINOPHEN 325 MG PO TABS
650.0000 mg | ORAL_TABLET | ORAL | Status: DC | PRN
  Administered 2015-10-20 – 2015-10-25 (×7): 650 mg via ORAL
  Filled 2015-10-20 (×7): qty 2

## 2015-10-20 MED ORDER — DOCUSATE SODIUM 100 MG PO CAPS
100.0000 mg | ORAL_CAPSULE | Freq: Every day | ORAL | Status: DC
  Administered 2015-10-21 – 2015-10-26 (×6): 100 mg via ORAL
  Filled 2015-10-20 (×6): qty 1

## 2015-10-20 NOTE — H&P (Addendum)
Expand All Collapse All    History     CSN: [id number]  Arrival date and time: 10/20/15 5997  First Provider Initiated Contact with Patient 10/20/15 0840    Chief Complaint  Patient presents with  . Vaginal Bleeding   HPI   [name] is a 24 y.o. female G3P1011 at [redacted]w[redacted]d presenting to MAU after one episode of bright red blood that was noted after using the bathroom.  This morning she woke up and noticed blood on the tissue when she wiped. No blood was noted in the toilet. The blood is described as small-moderate amount "it covered the tissue", the bleeding was light red "pink" in color.  She denies constipation.   She denies placenta previa  No intercourse recently.   + fetal movement.   OB History    Gravida Para Term Preterm AB TAB SAB Ectopic Multiple Living   3 1 1  1 1    1       Past Medical History  Diagnosis Date  . Medical history non-contributory     Past Surgical History  Procedure Laterality Date  . Tonsillectomy    . Adenoidectomy    . Cesarean section      Family History  Problem Relation Age of Onset  . Hypertension Mother   . Diabetes Maternal Grandmother   . Heart disease Maternal Grandmother     Social History  Substance Use Topics  . Smoking status: Current Every Day Smoker -- 0.50 packs/day    Types: Cigarettes  . Smokeless tobacco: Never Used  . Alcohol Use: Yes     Comment: occasional    Allergies: No Known Allergies  Prescriptions prior to admission  Medication Sig Dispense Refill Last Dose  . acetaminophen (TYLENOL) 500 MG tablet Take 1,000 mg by mouth every 6 (six) hours as needed for mild pain.    Taking  . oxyCODONE-acetaminophen (PERCOCET/ROXICET) 5-325 MG per tablet Take 2 tablets by mouth every 4 (four) hours as needed for severe pain. 15 tablet 0 Not Taking  . promethazine (PHENERGAN)  25 MG tablet Take 1 tablet (25 mg total) by mouth every 6 (six) hours as needed for nausea or vomiting. 30 tablet 0 Not Taking    Lab Results Last 48 Hours    Results for orders placed or performed during the hospital encounter of 10/20/15 (from the past 48 hour(s))  Urinalysis, Routine w reflex microscopic (not at [facility]) Status: Abnormal   Collection Time: 10/20/15 8:26 AM  Result Value Ref Range   Color, Urine YELLOW YELLOW   APPearance HAZY (A) CLEAR   Specific Gravity, Urine 1.020 1.005 - 1.030   pH 7.5 5.0 - 8.0   Glucose, UA NEGATIVE NEGATIVE mg/dL   Hgb urine dipstick NEGATIVE NEGATIVE   Bilirubin Urine NEGATIVE NEGATIVE   Ketones, ur 15 (A) NEGATIVE mg/dL   Protein, ur NEGATIVE NEGATIVE mg/dL   Nitrite NEGATIVE NEGATIVE   Leukocytes, UA TRACE (A) NEGATIVE  Urine microscopic-add on Status: Abnormal   Collection Time: 10/20/15 8:26 AM  Result Value Ref Range   Squamous Epithelial / LPF 6-30 (A) NONE SEEN   WBC, UA 0-5 0 - 5 WBC/hpf   RBC / HPF NONE SEEN 0 - 5 RBC/hpf   Bacteria, UA RARE (A) NONE SEEN  Wet prep, genital Status: Abnormal   Collection Time: 10/20/15 9:10 AM  Result Value Ref Range   Yeast Wet Prep HPF POC NONE SEEN NONE SEEN   Trich, Wet Prep NONE SEEN NONE  SEEN   Clue Cells Wet Prep HPF POC PRESENT (A) NONE SEEN   WBC, Wet Prep HPF POC FEW (A) NONE SEEN    Comment: FEW BACTERIA SEEN   Sperm NONE SEEN   Type and screen [facility] [place] Status: None   Collection Time: 10/20/15 11:30 AM  Result Value Ref Range   ABO/RH(D) O POS    Antibody Screen NEG    Sample Expiration 10/23/2015          Review of Systems  Constitutional: Negative for fever and chills.  Gastrointestinal: Negative for nausea, vomiting, abdominal pain, diarrhea and constipation.  Genitourinary: Negative for dysuria, urgency and  frequency.   Physical Exam   Blood pressure 127/67, pulse 88, temperature 98.3 F (36.8 C), temperature source Oral, resp. rate 18, height  (1.6 m), weight 160 lb (72.576 kg), last menstrual period 04/18/2015, unknown if currently breastfeeding.  Physical Exam  Constitutional: She is oriented to person, place, and time. She appears well-developed and well-nourished.  HENT:  Head: Normocephalic.  Eyes: Pupils are equal, round, and reactive to light.  Respiratory: Effort normal.  GI: Soft. She exhibits no distension. There is no tenderness. There is no rebound and no guarding.  Genitourinary:  Speculum exam: Vagina - Small amount of creamy, white discharge, no evidence of vaginal bleeding Cervix - No contact bleeding Bimanual exam: Deferred  wet prep done Chaperone present for exam.  Musculoskeletal: Normal range of motion.  Neurological: She is alert and oriented to person, place, and time.  Skin: Skin is warm.  Psychiatric: Her behavior is normal.   Fetal Tracing: Baseline: 125 bpm Variability: moderate  Accelerations: 15x15 Decelerations: none Toco: quiet   MAU Course  Procedures  None  MDM  O positive blood type  Discussed patient with Dr. Rana Snare; review [place] report and physical exam. Admit for observation.  Betamethasone.  Dr. Katrinka Blazing with NICU approved admission to ante for observation. Patient is low risk for delivery.    Assessment and Plan   A:  1. [redacted] weeks gestation of pregnancy   2. Vaginal bleeding in pregnancy, second trimester   3. Subchorionic bleed, second trimester   4. BV (bacterial vaginosis)    P:  Admit to Ante per Dr. Rana Snare  Betamethasone; repeat in 24 hours Patient will need RX for flagyl at discharge Regular diet     Duane Lope, NP 10/20/2015 10:57 AM  Upon admission to Antepartum, patient was stable, no bleeding or ctxs but had complaint of SOB.  Spot o2 sat was 88 % and increased back to 96%+ with  deep breaths Lungs CTAB but will check CBC and CxR   IUP at 26 3/7 Breech Vag bleeding episode -[place] with 5cm SCH, stable currently BMZ and monitoring. BV - begin Flagyl SOB - check CxR and monitoring [place] - fu in 3 weeks per MFM  DL

## 2015-10-20 NOTE — MAU Note (Signed)
Pt reports she woke up this morning and had bleeding when she wiped. Also c/o some cramping.

## 2015-10-20 NOTE — MAU Note (Signed)
Called [facility] suites for room spoke with Carollee Herter RN will relay information to [employer].

## 2015-10-20 NOTE — MAU Provider Note (Signed)
History     CSN: [id number]  Arrival date and time: 10/20/15 4098   First Provider Initiated Contact with Patient 10/20/15 0840      Chief Complaint  Patient presents with  . Vaginal Bleeding   HPI   [name] is a 24 y.o. female G3P1011 at [redacted]w[redacted]d presenting to MAU after one episode of bright red blood that was noted after using the bathroom.  This morning she woke up and noticed blood on the tissue when she wiped. No blood was noted in the toilet. The blood is described as small-moderate amount "it covered the tissue", the bleeding was light red "pink" in color.  She denies constipation.   She denies placenta previa  No intercourse recently.   + fetal movement.   OB History    Gravida Para Term Preterm AB TAB SAB Ectopic Multiple Living   Past Medical History  Diagnosis Date  . Medical history non-contributory     Past Surgical History  Procedure Laterality Date  . Tonsillectomy    . Adenoidectomy    . Cesarean section      Family History  Problem Relation Age of Onset  . Hypertension Mother   . Diabetes Maternal Grandmother   . Heart disease Maternal Grandmother     Social History  Substance Use Topics  . Smoking status: Current Every Day Smoker -- 0.50 packs/day    Types: Cigarettes  . Smokeless tobacco: Never Used  . Alcohol Use: Yes     Comment: occasional    Allergies: No Known Allergies  Prescriptions prior to admission  Medication Sig Dispense Refill Last Dose  . acetaminophen (TYLENOL) 500 MG tablet Take 1,000 mg by mouth every 6 (six) hours as needed for mild pain.    Taking  . oxyCODONE-acetaminophen (PERCOCET/ROXICET) 5-325 MG per tablet Take 2 tablets by mouth every 4 (four) hours as needed for severe pain. 15 tablet 0 Not Taking  . promethazine (PHENERGAN) 25 MG tablet Take 1 tablet (25 mg total) by mouth every 6 (six) hours as needed for nausea or vomiting. 30 tablet 0 Not Taking   Results for orders  placed or performed during the hospital encounter of 10/20/15 (from the past 48 hour(s))  Urinalysis, Routine w reflex microscopic (not at [facility])     Status: Abnormal   Collection Time: 10/20/15  8:26 AM  Result Value Ref Range   Color, Urine YELLOW YELLOW   APPearance HAZY (A) CLEAR   Specific Gravity, Urine 1.020 1.005 - 1.030   pH 7.5 5.0 - 8.0   Glucose, UA NEGATIVE NEGATIVE mg/dL   Hgb urine dipstick NEGATIVE NEGATIVE   Bilirubin Urine NEGATIVE NEGATIVE   Ketones, ur 15 (A) NEGATIVE mg/dL   Protein, ur NEGATIVE NEGATIVE mg/dL   Nitrite NEGATIVE NEGATIVE   Leukocytes, UA TRACE (A) NEGATIVE  Urine microscopic-add on     Status: Abnormal   Collection Time: 10/20/15  8:26 AM  Result Value Ref Range   Squamous Epithelial / LPF 6-30 (A) NONE SEEN   WBC, UA 0-5 0 - 5 WBC/hpf   RBC / HPF NONE SEEN 0 - 5 RBC/hpf   Bacteria, UA RARE (A) NONE SEEN  Wet prep, genital     Status: Abnormal   Collection Time: 10/20/15  9:10 AM  Result Value Ref Range   Yeast Wet Prep HPF POC NONE SEEN NONE SEEN   Trich, Wet Prep NONE  SEEN NONE SEEN   Clue Cells Wet Prep HPF POC PRESENT (A) NONE SEEN   WBC, Wet Prep HPF POC FEW (A) NONE SEEN    Comment: FEW BACTERIA SEEN   Sperm NONE SEEN   Type and screen [facility] [place]     Status: None   Collection Time: 10/20/15 11:30 AM  Result Value Ref Range   ABO/RH(D) O POS    Antibody Screen NEG    Sample Expiration 10/23/2015        Review of Systems  Constitutional: Negative for fever and chills.  Gastrointestinal: Negative for nausea, vomiting, abdominal pain, diarrhea and constipation.  Genitourinary: Negative for dysuria, urgency and frequency.   Physical Exam   Blood pressure 127/67, pulse 88, temperature 98.3 F (36.8 C), temperature source Oral, resp. rate 18, height  (1.6 m), weight 160 lb (72.576 kg), last menstrual period 04/18/2015, unknown if currently breastfeeding.  Physical Exam  Constitutional: She is oriented to  person, place, and time. She appears well-developed and well-nourished.  HENT:  Head: Normocephalic.  Eyes: Pupils are equal, round, and reactive to light.  Respiratory: Effort normal.  GI: Soft. She exhibits no distension. There is no tenderness. There is no rebound and no guarding.  Genitourinary:  Speculum exam: Vagina - Small amount of creamy, white discharge, no evidence of vaginal bleeding Cervix - No contact bleeding Bimanual exam: Deferred  wet prep done Chaperone present for exam.  Musculoskeletal: Normal range of motion.  Neurological: She is alert and oriented to person, place, and time.  Skin: Skin is warm.  Psychiatric: Her behavior is normal.   Fetal Tracing: Baseline: 125 bpm Variability: moderate  Accelerations: 15x15 Decelerations: none Toco: quiet   MAU Course  Procedures  None  MDM  O positive blood type  Discussed patient with Dr. Rana Snare; review [place] report and physical exam. Admit for observation.  Betamethasone.  Dr. Katrinka Blazing with NICU approved admission to ante for observation. Patient is low risk for delivery.    Assessment and Plan   A:  1. [redacted] weeks gestation of pregnancy   2. Vaginal bleeding in pregnancy, second trimester   3. Subchorionic bleed, second trimester   4. BV (bacterial vaginosis)    P:  Admit to Ante per Dr. Rana Snare  Betamethasone; repeat in 24 hours Patient will need RX for flagyl at discharge Regular diet     Duane Lope, NP 10/20/2015 10:57 AM

## 2015-10-21 ENCOUNTER — Encounter (HOSPITAL_COMMUNITY): Payer: Self-pay | Admitting: General Surgery

## 2015-10-21 NOTE — Progress Notes (Signed)
Pt denies vb and lof.  + FM  AF, VSS gen - NAD Abd - gravid, NT  A/P:  26+4 with subchorionic hemorrhage BMZ #2 today Continue in hospital observation x 7d, then re-eval Plan of care d/w pt

## 2015-10-22 NOTE — Progress Notes (Signed)
Pt denies vb and ctx.  Mild occasional cramping  AF, VSS Gen - NAD Abd - gravid, NT Ext - NT  A/P:  26+5 with subchorionic hemorrhage S/p BMZ 12/9, 12/10 Continue inpatient obs

## 2015-10-23 LAB — TYPE AND SCREEN
ABO/RH(D): O POS
Antibody Screen: NEGATIVE

## 2015-10-23 MED ORDER — HYDROCODONE-ACETAMINOPHEN 5-325 MG PO TABS
1.0000 | ORAL_TABLET | Freq: Three times a day (TID) | ORAL | Status: DC | PRN
  Administered 2015-10-23: 1 via ORAL
  Filled 2015-10-23: qty 1

## 2015-10-23 NOTE — Progress Notes (Signed)
Pt denies vb, ctx, lof.  Good fm.  C/o migraine HA.  No relief with tylenol  AF, VSS gen - NAD Abd - gravid, NT Ext - NT  A/P:  26+6 weeks with large subchorionic hemorrhage S/p BMZ 12/9 & 12/10 Continue inpatient bedrest and obs x 7d Discussed rpt ultrasound wed or thur to reassess hemorrhage vicodin prn for HA - discouraged regular use, risks discussed

## 2015-10-24 MED ORDER — GI COCKTAIL ~~LOC~~
30.0000 mL | Freq: Once | ORAL | Status: AC
  Administered 2015-10-24: 30 mL via ORAL
  Filled 2015-10-24: qty 30

## 2015-10-24 MED ORDER — FAMOTIDINE 20 MG PO TABS
40.0000 mg | ORAL_TABLET | Freq: Every day | ORAL | Status: DC
  Administered 2015-10-24 – 2015-10-25 (×2): 40 mg via ORAL
  Filled 2015-10-24 (×2): qty 2

## 2015-10-24 NOTE — Progress Notes (Signed)
Pt c/o heartburn, requesting meds. 

## 2015-10-24 NOTE — Progress Notes (Signed)
27 0/7 weeks No bleeding/leaking/pain VSS Afeb Uterus soft, NT FHT + accelss UCs none  A/P: large [facility]         Observe x 7 days         BMTZ 12/9 and 12/10

## 2015-10-24 NOTE — Progress Notes (Signed)
Pt reports no relief from Tums previously given for heartburn.

## 2015-10-25 NOTE — Progress Notes (Signed)
Patient ID: [name], female   DOB: 05-14-1991, 24 y.o.   MRN: [id number] S: NO BLEEDING OR CTX O: AF VSS     GRAVID UTERUS NONTENDER     FHR CAT ONE A: IUP AT 27.1 WITH  SCH P: CONTINUE REST D/C WED OR Thursday.

## 2015-10-25 NOTE — Progress Notes (Signed)
Patient c/o heartburn.  I medicated her early with Pepcid and tums.

## 2015-10-26 ENCOUNTER — Inpatient Hospital Stay (HOSPITAL_COMMUNITY): Payer: Managed Care, Other (non HMO)

## 2015-10-26 DIAGNOSIS — O4590 Premature separation of placenta, unspecified, unspecified trimester: Secondary | ICD-10-CM | POA: Diagnosis present

## 2015-10-26 NOTE — Discharge Summary (Signed)
  Admission Diagnosis: IUP at 26 w 2 days Vaginal bleeding  DIscharge Diagnosis: Same Placental abruption  Hospital Course: 24 year old female admitted with vaginal bleeding. Retroplacental hemorrhage noted on ultrasound. MFM consult recommend in house observation for 7 days for vaginal bleeding. After 7 days of no bleeding she was discharged home in good condition. She was advised on bedrest Follow up in office on Monday. Return immediately if she has any additional bleeding.

## 2015-10-26 NOTE — Progress Notes (Signed)
Patient doing well. No complaints.  Good fetal movement No bleeding No pain  BP 117/83 mmHg  Pulse 97  Temp(Src) 98.2 F (36.8 C) (Oral)  Resp 17  Ht  (1.6 m)  Wt 160 lb (72.576 kg)  BMI 28.35 kg/m2  SpO2 97%  LMP 04/18/2015 Abdomen is soft and non tender  IUP at 27 w 2 days Placental Abruption  Ultrasound today - if stable will discharge home

## 2015-10-26 NOTE — Discharge Instructions (Signed)
Fetal Movement Counts  Patient Name: __________________________________________________ Patient Due Date: ____________________  Performing a fetal movement count is highly recommended in high-risk pregnancies, but it is good for every pregnant woman to do. Your health care provider may ask you to start counting fetal movements at 28 weeks of the pregnancy. Fetal movements often increase:  · After eating a full meal.  · After physical activity.  · After eating or drinking something sweet or cold.  · At rest.  Pay attention to when you feel the baby is most active. This will help you notice a pattern of your baby's sleep and wake cycles and what factors contribute to an increase in fetal movement. It is important to perform a fetal movement count at the same time each day when your baby is normally most active.   HOW TO COUNT FETAL MOVEMENTS  1. Find a quiet and comfortable area to sit or lie down on your left side. Lying on your left side provides the best blood and oxygen circulation to your baby.  2. Write down the day and time on a sheet of paper or in a journal.  3. Start counting kicks, flutters, swishes, rolls, or jabs in a 2-hour period. You should feel at least 10 movements within 2 hours.  4. If you do not feel 10 movements in 2 hours, wait 2-3 hours and count again. Look for a change in the pattern or not enough counts in 2 hours.  SEEK MEDICAL CARE IF:  · You feel less than 10 counts in 2 hours, tried twice.  · There is no movement in over an hour.  · The pattern is changing or taking longer each day to reach 10 counts in 2 hours.  · You feel the baby is not moving as he or she usually does.  Date: ____________ Movements: ____________ Start time: ____________ Finish time: ____________   Date: ____________ Movements: ____________ Start time: ____________ Finish time: ____________  Date: ____________ Movements: ____________ Start time: ____________ Finish time: ____________  Date: ____________ Movements:  ____________ Start time: ____________ Finish time: ____________  Date: ____________ Movements: ____________ Start time: ____________ Finish time: ____________  Date: ____________ Movements: ____________ Start time: ____________ Finish time: ____________  Date: ____________ Movements: ____________ Start time: ____________ Finish time: ____________  Date: ____________ Movements: ____________ Start time: ____________ Finish time: ____________   Date: ____________ Movements: ____________ Start time: ____________ Finish time: ____________  Date: ____________ Movements: ____________ Start time: ____________ Finish time: ____________  Date: ____________ Movements: ____________ Start time: ____________ Finish time: ____________  Date: ____________ Movements: ____________ Start time: ____________ Finish time: ____________  Date: ____________ Movements: ____________ Start time: ____________ Finish time: ____________  Date: ____________ Movements: ____________ Start time: ____________ Finish time: ____________  Date: ____________ Movements: ____________ Start time: ____________ Finish time: ____________   Date: ____________ Movements: ____________ Start time: ____________ Finish time: ____________  Date: ____________ Movements: ____________ Start time: ____________ Finish time: ____________  Date: ____________ Movements: ____________ Start time: ____________ Finish time: ____________  Date: ____________ Movements: ____________ Start time: ____________ Finish time: ____________  Date: ____________ Movements: ____________ Start time: ____________ Finish time: ____________  Date: ____________ Movements: ____________ Start time: ____________ Finish time: ____________  Date: ____________ Movements: ____________ Start time: ____________ Finish time: ____________   Date: ____________ Movements: ____________ Start time: ____________ Finish time: ____________  Date: ____________ Movements: ____________ Start time: ____________ Finish  time: ____________  Date: ____________ Movements: ____________ Start time: ____________ Finish time: ____________  Date: ____________ Movements: ____________ Start time:   ____________ Finish time: ____________  Date: ____________ Movements: ____________ Start time: ____________ Finish time: ____________  Date: ____________ Movements: ____________ Start time: ____________ Finish time: ____________  Date: ____________ Movements: ____________ Start time: ____________ Finish time: ____________   Date: ____________ Movements: ____________ Start time: ____________ Finish time: ____________  Date: ____________ Movements: ____________ Start time: ____________ Finish time: ____________  Date: ____________ Movements: ____________ Start time: ____________ Finish time: ____________  Date: ____________ Movements: ____________ Start time: ____________ Finish time: ____________  Date: ____________ Movements: ____________ Start time: ____________ Finish time: ____________  Date: ____________ Movements: ____________ Start time: ____________ Finish time: ____________  Date: ____________ Movements: ____________ Start time: ____________ Finish time: ____________   Date: ____________ Movements: ____________ Start time: ____________ Finish time: ____________  Date: ____________ Movements: ____________ Start time: ____________ Finish time: ____________  Date: ____________ Movements: ____________ Start time: ____________ Finish time: ____________  Date: ____________ Movements: ____________ Start time: ____________ Finish time: ____________  Date: ____________ Movements: ____________ Start time: ____________ Finish time: ____________  Date: ____________ Movements: ____________ Start time: ____________ Finish time: ____________  Date: ____________ Movements: ____________ Start time: ____________ Finish time: ____________   Date: ____________ Movements: ____________ Start time: ____________ Finish time: ____________  Date: ____________  Movements: ____________ Start time: ____________ Finish time: ____________  Date: ____________ Movements: ____________ Start time: ____________ Finish time: ____________  Date: ____________ Movements: ____________ Start time: ____________ Finish time: ____________  Date: ____________ Movements: ____________ Start time: ____________ Finish time: ____________  Date: ____________ Movements: ____________ Start time: ____________ Finish time: ____________  Date: ____________ Movements: ____________ Start time: ____________ Finish time: ____________   Date: ____________ Movements: ____________ Start time: ____________ Finish time: ____________  Date: ____________ Movements: ____________ Start time: ____________ Finish time: ____________  Date: ____________ Movements: ____________ Start time: ____________ Finish time: ____________  Date: ____________ Movements: ____________ Start time: ____________ Finish time: ____________  Date: ____________ Movements: ____________ Start time: ____________ Finish time: ____________  Date: ____________ Movements: ____________ Start time: ____________ Finish time: ____________     This information is not intended to replace advice given to you by your health care provider. Make sure you discuss any questions you have with your health care provider.     Document Released: 11/28/2006 Document Revised: 11/19/2014 Document Reviewed: 08/25/2012  [employer]vier Interactive Patient Education ©2016 [employer]

## 2015-11-26 ENCOUNTER — Inpatient Hospital Stay (HOSPITAL_COMMUNITY)
Admission: AD | Admit: 2015-11-26 | Discharge: 2015-11-26 | Disposition: A | Discharge status: Home / Self Care | Payer: Managed Care, Other (non HMO) | Source: Ambulatory Visit | Attending: Obstetrics and Gynecology | Admitting: Obstetrics and Gynecology

## 2015-11-26 ENCOUNTER — Encounter (HOSPITAL_COMMUNITY): Payer: Self-pay

## 2015-11-26 DIAGNOSIS — F17210 Nicotine dependence, cigarettes, uncomplicated: Secondary | ICD-10-CM | POA: Diagnosis not present

## 2015-11-26 DIAGNOSIS — R102 Pelvic and perineal pain: Secondary | ICD-10-CM | POA: Diagnosis not present

## 2015-11-26 DIAGNOSIS — N898 Other specified noninflammatory disorders of vagina: Secondary | ICD-10-CM | POA: Diagnosis not present

## 2015-11-26 DIAGNOSIS — N949 Unspecified condition associated with female genital organs and menstrual cycle: Secondary | ICD-10-CM

## 2015-11-26 DIAGNOSIS — R109 Unspecified abdominal pain: Secondary | ICD-10-CM | POA: Diagnosis present

## 2015-11-26 DIAGNOSIS — O26893 Other specified pregnancy related conditions, third trimester: Secondary | ICD-10-CM | POA: Diagnosis not present

## 2015-11-26 DIAGNOSIS — Z3A31 31 weeks gestation of pregnancy: Secondary | ICD-10-CM | POA: Diagnosis not present

## 2015-11-26 DIAGNOSIS — O99333 Smoking (tobacco) complicating pregnancy, third trimester: Secondary | ICD-10-CM | POA: Insufficient documentation

## 2015-11-26 LAB — WET PREP, GENITAL
CLUE CELLS WET PREP: NONE SEEN
Sperm: NONE SEEN
TRICH WET PREP: NONE SEEN
YEAST WET PREP: NONE SEEN

## 2015-11-26 LAB — URINALYSIS, ROUTINE W REFLEX MICROSCOPIC
Bilirubin Urine: NEGATIVE
Glucose, UA: NEGATIVE mg/dL
Hgb urine dipstick: NEGATIVE
Ketones, ur: NEGATIVE mg/dL
LEUKOCYTES UA: NEGATIVE
NITRITE: NEGATIVE
PROTEIN: NEGATIVE mg/dL
SPECIFIC GRAVITY, URINE: 1.02 (ref 1.005–1.030)
pH: 7 (ref 5.0–8.0)

## 2015-11-26 LAB — AMNISURE RUPTURE OF MEMBRANE (ROM) NOT AT ARMC: AMNISURE: NEGATIVE

## 2015-11-26 NOTE — Discharge Instructions (Signed)

## 2015-11-26 NOTE — MAU Note (Signed)
Pt is on bedrest.  Increased pain and pressure in lower abd, is now constant.  Had some light red spotting 2 days ago.  ? Leakage today

## 2015-11-26 NOTE — MAU Provider Note (Signed)
History     CSN: [id number]  Arrival date and time: 11/26/15 1126   First Provider Initiated Contact with Patient 11/26/15 1215      Chief Complaint  Patient presents with  . Abdominal Pain  . Vaginal Discharge   HPI   [name] is a 25 y.o. female G3P1011 at [redacted]w[redacted]d presenting with pelvic pressure. She is currently on bedrest due to recent vaginal bleeding and [place] that showed subchorionic hemorrhage with possible partial abruption. She complains of pressure along with mid- lower abdominal pain. The pain is constant. She has never had this pain before. She has also noted an increase in discharge, she thinks she is leaking fluid. The fluid is clear, non-odorous. She did not have a gush, just increase in the amount of fluid/discharge.   She has had Intercourse in the last 48 hours.  Last BM was a few days ago; she has had trouble with constipation.   + fetal movement, denies vaginal bleeding.   OB History    Gravida Para Term Preterm AB TAB SAB Ectopic Multiple Living   Past Medical History  Diagnosis Date  . Medical history non-contributory     Past Surgical History  Procedure Laterality Date  . Tonsillectomy    . Adenoidectomy    . Cesarean section      Family History  Problem Relation Age of Onset  . Hypertension Mother   . Diabetes Maternal Grandmother   . Heart disease Maternal Grandmother     Social History  Substance Use Topics  . Smoking status: Current Every Day Smoker -- 0.50 packs/day    Types: Cigarettes  . Smokeless tobacco: Never Used  . Alcohol Use: Yes     Comment: occasional    Allergies: No Known Allergies  Prescriptions prior to admission  Medication Sig Dispense Refill Last Dose  . Prenatal Vit-Fe Fumarate-FA (PRENATAL MULTIVITAMIN) TABS tablet Take 1 tablet by mouth daily at 12 noon.   11/25/2015 at Unknown time   Results for orders placed or performed during the hospital encounter of 11/26/15 (from the  past 48 hour(s))  Urinalysis, Routine w reflex microscopic (not at [facility])     Status: None   Collection Time: 11/26/15 11:35 AM  Result Value Ref Range   Color, Urine YELLOW YELLOW   APPearance CLEAR CLEAR   Specific Gravity, Urine 1.020 1.005 - 1.030   pH 7.0 5.0 - 8.0   Glucose, UA NEGATIVE NEGATIVE mg/dL   Hgb urine dipstick NEGATIVE NEGATIVE   Bilirubin Urine NEGATIVE NEGATIVE   Ketones, ur NEGATIVE NEGATIVE mg/dL   Protein, ur NEGATIVE NEGATIVE mg/dL   Nitrite NEGATIVE NEGATIVE   Leukocytes, UA NEGATIVE NEGATIVE    Comment: MICROSCOPIC NOT DONE ON URINES WITH NEGATIVE PROTEIN, BLOOD, LEUKOCYTES, NITRITE, OR GLUCOSE <1000 mg/dL.  Amnisure rupture of membrane (rom)not at [facility]     Status: None   Collection Time: 11/26/15 12:30 PM  Result Value Ref Range   Amnisure ROM NEGATIVE   Wet prep, genital     Status: Abnormal   Collection Time: 11/26/15 12:30 PM  Result Value Ref Range   Yeast Wet Prep HPF POC NONE SEEN NONE SEEN   Trich, Wet Prep NONE SEEN NONE SEEN   Clue Cells Wet Prep HPF POC NONE SEEN NONE SEEN   WBC, Wet Prep HPF POC FEW (A) NONE SEEN    Comment: MODERATE BACTERIA SEEN   Sperm  NONE SEEN     Review of Systems  Constitutional: Negative for fever and chills.  Gastrointestinal: Positive for nausea, abdominal pain and constipation. Negative for vomiting and diarrhea.  Genitourinary: Negative for dysuria.   Physical Exam   Blood pressure 122/65, pulse 97, temperature 98.2 F (36.8 C), temperature source Oral, resp. rate 17, height 5\' 3"  (1.6 m), weight 170 lb (77.111 kg), last menstrual period 04/18/2015, unknown if currently breastfeeding.  Physical Exam  Constitutional: She is oriented to person, place, and time. She appears well-developed and well-nourished. No distress.  HENT:  Head: Normocephalic.  Eyes: Pupils are equal, round, and reactive to light.  GI: Soft. She exhibits no distension. There is no tenderness. There is no rebound and no guarding.   Genitourinary:  Speculum exam: Vagina - Small amount of creamy, white discharge, no odor. No pooling of fluid  Cervix - No contact bleeding, no active bleeding  Bimanual exam: Cervix closed, thick, soft.  Uterus non tender, Gravid  Adnexa non tender, no masses bilaterally Amnisure, wet prep done Chaperone present for exam.  Musculoskeletal: Normal range of motion.  Neurological: She is alert and oriented to person, place, and time.  Skin: Skin is warm. She is not diaphoretic.  Psychiatric: Her behavior is normal.    Fetal Tracing: Baseline: 135 bpm  Variability: Moderate  Accelerations: 15x15 Decelerations:none Toco: quiet.   MAU Course  Procedures  None  MDM  Amnisure negative Urine normal Patient has had round of betamethasone with last admission.   Discussed patient with Dr. Rana Snare @ 1350   Assessment and Plan   A:    ICD-9-CM ICD-10-CM   1. Pelvic pain in pregnancy, antepartum, third trimester 646.83 O26.893    625.9 N94.9   2. Vaginal discharge in pregnancy in third trimester 646.83 O26.893    623.5 N89.8     P:  Discharge home in stable condition  Preterm labor precautions at length Return to MAU if symptoms worsen Keep next scheduled appointment Pelvic rest Continue bedrest  Kick counts   Duane Lope, NP 11/26/2015 2:39 PM

## 2015-12-01 ENCOUNTER — Inpatient Hospital Stay (HOSPITAL_COMMUNITY)
Admission: AD | Admit: 2015-12-01 | Discharge: 2015-12-01 | Disposition: A | Discharge status: Home / Self Care | Payer: Managed Care, Other (non HMO) | Source: Ambulatory Visit | Attending: Obstetrics and Gynecology | Admitting: Obstetrics and Gynecology

## 2015-12-01 ENCOUNTER — Inpatient Hospital Stay (HOSPITAL_COMMUNITY): Payer: Managed Care, Other (non HMO)

## 2015-12-01 ENCOUNTER — Encounter (HOSPITAL_COMMUNITY): Payer: Self-pay

## 2015-12-01 DIAGNOSIS — O99333 Smoking (tobacco) complicating pregnancy, third trimester: Secondary | ICD-10-CM | POA: Insufficient documentation

## 2015-12-01 DIAGNOSIS — O4693 Antepartum hemorrhage, unspecified, third trimester: Secondary | ICD-10-CM | POA: Insufficient documentation

## 2015-12-01 DIAGNOSIS — F17210 Nicotine dependence, cigarettes, uncomplicated: Secondary | ICD-10-CM | POA: Insufficient documentation

## 2015-12-01 DIAGNOSIS — Z3A32 32 weeks gestation of pregnancy: Secondary | ICD-10-CM | POA: Diagnosis not present

## 2015-12-01 LAB — URINALYSIS, ROUTINE W REFLEX MICROSCOPIC
Bilirubin Urine: NEGATIVE
GLUCOSE, UA: NEGATIVE mg/dL
KETONES UR: NEGATIVE mg/dL
Nitrite: NEGATIVE
PROTEIN: NEGATIVE mg/dL
Specific Gravity, Urine: 1.02 (ref 1.005–1.030)
pH: 7 (ref 5.0–8.0)

## 2015-12-01 LAB — URINE MICROSCOPIC-ADD ON

## 2015-12-01 NOTE — MAU Note (Signed)
Pt presents complaining of bright red vaginal bleeding when she wipes. States it started today but was admitted for similar problem last month. Still having pelvic pressure and cramping. Reports good fetal movement.

## 2015-12-01 NOTE — MAU Provider Note (Addendum)
History   [id number]   Chief Complaint  Patient presents with  . Vaginal Bleeding    HPI [name] is a 26 y.o. female  G3P1011 here with report of vaginal bleeding with wiping that started today.  Pt was diagnosed with subchorionic hemorrhage (small) n in early December and hospitalized for observation. Given BMZ on 12/8 and 12/9.  Reports bleeding as scant and occurred x 2 with wiping only.  Denies UTI symptoms.  +fetal movement.    Patient's last menstrual period was 04/18/2015.  OB History  Gravida Para Term Preterm AB SAB TAB Ectopic Multiple Living  # Outcome Date GA Lbr Len/2nd Weight Sex Delivery Anes PTL Lv  3 Current           2 TAB 2015          1 Term 07/11/09    M CS-LTranv Spinal  Y      Past Medical History  Diagnosis Date  . Medical history non-contributory     Family History  Problem Relation Age of Onset  . Hypertension Mother   . Diabetes Maternal Grandmother   . Heart disease Maternal Grandmother     Social History   Social History  . Marital Status: Single    Spouse Name: N/A  . Number of Children: N/A  . Years of Education: N/A   Social History Main Topics  . Smoking status: Current Every Day Smoker -- 0.50 packs/day    Types: Cigarettes  . Smokeless tobacco: Never Used  . Alcohol Use: Yes     Comment: occasional  . Drug Use: No  . Sexual Activity: Yes    Birth Control/ Protection: None   Other Topics Concern  . None   Social History Narrative    No Known Allergies  No current facility-administered medications on file prior to encounter.   Current Outpatient Prescriptions on File Prior to Encounter  Medication Sig Dispense Refill  . Prenatal Vit-Fe Fumarate-FA (PRENATAL MULTIVITAMIN) TABS tablet Take 1 tablet by mouth daily at 12 noon.       Review of Systems  Genitourinary: Positive for vaginal bleeding. Negative for dysuria, hematuria and flank pain.     Physical Exam   Filed Vitals:   12/01/15 1912  BP: 125/66  Pulse: 93  Temp: 98 F (36.7 C)  TempSrc: Oral  Resp: 18    Physical Exam  Constitutional: She is oriented to person, place, and time. She appears well-developed and well-nourished. No distress.  HENT:  Head: Normocephalic.  Neck: Normal range of motion. Neck supple.  Cardiovascular: Normal rate, regular rhythm and normal heart sounds.   Respiratory: Effort normal and breath sounds normal. No respiratory distress.  GI: Soft. There is no tenderness.  Genitourinary: No bleeding in the vagina. Vaginal discharge (mucusy) found.  Musculoskeletal: Normal range of motion. She exhibits no edema.  Neurological: She is alert and oriented to person, place, and time.  Skin: Skin is warm and dry.   Dilation: Closed Exam by:: Margarita Mail CNM  FHT: 130, moderate with 15x15 accels, no decels TocoL no UCs   BPP: 8/8 No abruption seen on [place] today MAU Course  Procedures  MDM 1935 Consulted with Dr. Rana Snare > Reviewed HPI/Exam/prior hospitalization and fetal tracing > obtain BPP and assess placenta  2055 Report given to H. Mathews Robinsons who assumes care of patient > Pt waiting for ultrasound  2200: D/W Dr. Rana Snare, ok for  DC home. Pelvic rest, and modified best rest at home.   Eino Farber Kennith Gain, CNM 12/01/2015 2100  Assessment and Plan   1. [redacted] weeks gestation of pregnancy   2. Vaginal bleeding in pregnancy, third trimester    DC home Comfort measures reviewed  Pelvic rest/modified bedrest  3rd Trimester precautions  Bleeding precautions PTL precautions  Fetal kick counts RX: none  Return to MAU as needed FU with OB as planned  Follow-up Information    Follow up with Turner Daniels, MD.   Specialty:  Obstetrics and Gynecology   Why:  As scheduled   Contact information:   [address] [place] [address] [phone]

## 2015-12-01 NOTE — Discharge Instructions (Signed)
Pelvic Rest °Pelvic rest is sometimes recommended for women when:  °· The placenta is partially or completely covering the opening of the cervix (placenta previa). °· There is bleeding between the uterine wall and the amniotic sac in the first trimester (subchorionic hemorrhage). °· The cervix begins to open without labor starting (incompetent cervix, cervical insufficiency). °· The labor is too early (preterm labor). °HOME CARE INSTRUCTIONS °· Do not have sexual intercourse, stimulation, or an orgasm. °· Do not use tampons, douche, or put anything in the vagina. °· Do not lift anything over 10 pounds (4.5 kg). °· Avoid strenuous activity or straining your pelvic muscles. °SEEK MEDICAL CARE IF:  °· You have any vaginal bleeding during pregnancy. Treat this as a potential emergency. °· You have cramping pain felt low in the stomach (stronger than menstrual cramps). °· You notice vaginal discharge (watery, mucus, or bloody). °· You have a low, dull backache. °· There are regular contractions or uterine tightening. °SEEK IMMEDIATE MEDICAL CARE IF: °You have vaginal bleeding and have placenta previa.  °  °This information is not intended to replace advice given to you by your health care provider. Make sure you discuss any questions you have with your health care provider. °  °Document Released: 02/23/2011 Document Revised: 01/21/2012 Document Reviewed: 05/02/2015 °Elsevier Interactive Patient Education ©2016 Elsevier Inc. ° °

## 2015-12-25 ENCOUNTER — Encounter (HOSPITAL_COMMUNITY): Payer: Self-pay

## 2015-12-25 ENCOUNTER — Inpatient Hospital Stay (HOSPITAL_COMMUNITY)
Admission: AD | Admit: 2015-12-25 | Discharge: 2015-12-25 | Disposition: A | Discharge status: Home / Self Care | Payer: Managed Care, Other (non HMO) | Source: Ambulatory Visit | Attending: Obstetrics and Gynecology | Admitting: Obstetrics and Gynecology

## 2015-12-25 ENCOUNTER — Inpatient Hospital Stay (HOSPITAL_COMMUNITY): Payer: Managed Care, Other (non HMO)

## 2015-12-25 DIAGNOSIS — Z3A35 35 weeks gestation of pregnancy: Secondary | ICD-10-CM | POA: Insufficient documentation

## 2015-12-25 DIAGNOSIS — O34219 Maternal care for unspecified type scar from previous cesarean delivery: Secondary | ICD-10-CM

## 2015-12-25 DIAGNOSIS — N898 Other specified noninflammatory disorders of vagina: Secondary | ICD-10-CM | POA: Diagnosis not present

## 2015-12-25 DIAGNOSIS — O26893 Other specified pregnancy related conditions, third trimester: Secondary | ICD-10-CM

## 2015-12-25 DIAGNOSIS — O99333 Smoking (tobacco) complicating pregnancy, third trimester: Secondary | ICD-10-CM | POA: Diagnosis not present

## 2015-12-25 DIAGNOSIS — M549 Dorsalgia, unspecified: Secondary | ICD-10-CM | POA: Diagnosis present

## 2015-12-25 DIAGNOSIS — O4700 False labor before 37 completed weeks of gestation, unspecified trimester: Secondary | ICD-10-CM | POA: Diagnosis not present

## 2015-12-25 DIAGNOSIS — M109 Gout, unspecified: Secondary | ICD-10-CM | POA: Diagnosis present

## 2015-12-25 DIAGNOSIS — Z3689 Encounter for other specified antenatal screening: Secondary | ICD-10-CM

## 2015-12-25 DIAGNOSIS — O4703 False labor before 37 completed weeks of gestation, third trimester: Secondary | ICD-10-CM

## 2015-12-25 LAB — URINE MICROSCOPIC-ADD ON

## 2015-12-25 LAB — URINALYSIS, ROUTINE W REFLEX MICROSCOPIC
BILIRUBIN URINE: NEGATIVE
Glucose, UA: NEGATIVE mg/dL
Ketones, ur: NEGATIVE mg/dL
NITRITE: NEGATIVE
PH: 6.5 (ref 5.0–8.0)
Protein, ur: NEGATIVE mg/dL
SPECIFIC GRAVITY, URINE: 1.01 (ref 1.005–1.030)

## 2015-12-25 LAB — AMNISURE RUPTURE OF MEMBRANE (ROM) NOT AT ARMC: Amnisure ROM: NEGATIVE

## 2015-12-25 NOTE — MAU Provider Note (Signed)
Chief Complaint:  Back Pain and Rupture of Membranes   None     HPI: [name] is a 25 y.o. G3P1011 at [redacted]w[redacted]d who presents to maternity admissions reporting back and abdominal pain that is intermittent every few minutes and leakage of fluid x 1 month but increasing today around 4 pm.  She describes the back/abdominal pain as intermittent and tightening pain.  She has tried warm bath, Tylenol, position change but the pain is increasing.  She was evaluated for PROM in MAU on previous visits and was negative but reports her leakage increased around 4 pm today and soaked a pantyliner but has not required a pad.  Nothing makes the leakage better or worse. She reports good fetal movement, denies vaginal bleeding, vaginal itching/burning, urinary symptoms, h/a, dizziness, n/v, or fever/chills.    HPI  Past Medical History: Past Medical History  Diagnosis Date  . Medical history non-contributory     Past obstetric history: OB History  Gravida Para Term Preterm AB SAB TAB Ectopic Multiple Living  # Outcome Date GA Lbr Len/2nd Weight Sex Delivery Anes PTL Lv  3 Current           2 TAB 2015          1 Term 07/11/09    M CS-LTranv Spinal  Y      Past Surgical History: Past Surgical History  Procedure Laterality Date  . Tonsillectomy    . Adenoidectomy    . Cesarean section      Family History: Family History  Problem Relation Age of Onset  . Hypertension Mother   . Diabetes Maternal Grandmother   . Heart disease Maternal Grandmother     Social History: Social History  Substance Use Topics  . Smoking status: Current Every Day Smoker -- 0.50 packs/day    Types: Cigarettes  . Smokeless tobacco: Never Used  . Alcohol Use: Yes     Comment: occasional    Allergies: No Known Allergies  Meds:  Prescriptions prior to admission  Medication Sig Dispense Refill Last Dose  . Prenatal Vit-Fe Fumarate-FA (PRENATAL MULTIVITAMIN) TABS tablet Take 1 tablet by  mouth daily at 12 noon.   Past Week at Unknown time    ROS:  Review of Systems  Constitutional: Negative for fever, chills and fatigue.  Eyes: Negative for visual disturbance.  Respiratory: Negative for shortness of breath.   Cardiovascular: Negative for chest pain.  Gastrointestinal: Positive for abdominal pain. Negative for nausea and vomiting.  Genitourinary: Positive for vaginal discharge and pelvic pain. Negative for dysuria, flank pain, vaginal bleeding, difficulty urinating and vaginal pain.  Musculoskeletal: Positive for back pain.  Neurological: Negative for dizziness and headaches.  Psychiatric/Behavioral: Negative.      I have reviewed patient's Past Medical Hx, Surgical Hx, Family Hx, Social Hx, medications and allergies.   Physical Exam  Patient Vitals for the past 24 hrs:  BP Temp Pulse Resp Height Weight  12/25/15 0328 131/72 mmHg 98.3 F (36.8 C) 95 18  (1.626 m) 179 lb (81.194 kg)   Constitutional: Well-developed, well-nourished female in no acute distress.  Cardiovascular: normal rate Respiratory: normal effort GI: Abd soft, non-tender, gravid appropriate for gestational age.  MS: Extremities nontender, no edema, normal ROM Neurologic: Alert and oriented x 4.  GU: Neg CVAT.  PELVIC EXAM: Cervix pink, visually closed, without lesion, large amount thick white vaginal discharge, vaginal walls and external genitalia  normal  Cervix 1.5/40/-3, posterior, medium consistency, vertex    FHT:  Baseline 150 , moderate variability, accelerations present, no decelerations Contractions: rare, mild to palpation   Labs: No results found for this or any previous visit (from the past 24 hour(s)). --/--/O POS (12/11 1220) Results for orders placed or performed during the hospital encounter of 12/25/15 (from the past 168 hour(s))  Urinalysis, Routine w reflex microscopic (not at [facility])   Collection Time: 12/25/15  3:35 AM  Result Value Ref Range   Color, Urine YELLOW  YELLOW   APPearance HAZY (A) CLEAR   Specific Gravity, Urine 1.010 1.005 - 1.030   pH 6.5 5.0 - 8.0   Glucose, UA NEGATIVE NEGATIVE mg/dL   Hgb urine dipstick TRACE (A) NEGATIVE   Bilirubin Urine NEGATIVE NEGATIVE   Ketones, ur NEGATIVE NEGATIVE mg/dL   Protein, ur NEGATIVE NEGATIVE mg/dL   Nitrite NEGATIVE NEGATIVE   Leukocytes, UA LARGE (A) NEGATIVE  Urine microscopic-add on   Collection Time: 12/25/15  3:35 AM  Result Value Ref Range   Squamous Epithelial / LPF 0-5 (A) NONE SEEN   WBC, UA 0-5 0 - 5 WBC/hpf   RBC / HPF 0-5 0 - 5 RBC/hpf   Bacteria, UA RARE (A) NONE SEEN   Urine-Other AMORPHOUS URATES/PHOSPHATES   Amnisure rupture of membrane (rom)not at [facility]   Collection Time: 12/25/15  3:40 AM  Result Value Ref Range   Amnisure ROM NEGATIVE    Imaging:  MAU Course/MDM: I have ordered labs and reviewed results. Negative pooling, negative amnisure.  No evidence of PROM or active labor. Consult Dr Henderson Cloud. BPP, EFW, and AFI ordered and normal results reviewed.  Labor precautions given.  Pt stable at time of discharge.  Assessment: 1. Threatened preterm labor, third trimester   2. Ultrasound scan to check fetal weight, previous C-section, antepartum   3. Vaginal discharge during pregnancy in third trimester     Plan: Discharge home Labor precautions and fetal kick counts F/U in office as scheduled Return to MAU with signs of labor or for emergencies    Medication List    ASK your doctor about these medications        prenatal multivitamin Tabs tablet  Take 1 tablet by mouth daily at 12 noon.        Sharen Counter Certified Nurse-Midwife 12/25/2015 3:49 AM

## 2015-12-25 NOTE — Discharge Instructions (Signed)
Reasons to return to MAU: ° °1.  Contractions are  5 minutes apart or less, each last 1 minute, these have been going on for 1-2 hours, and you cannot walk or talk during them °2.  You have a large gush of fluid, or a trickle of fluid that will not stop and you have to wear a pad °3.  You have bleeding that is bright red, heavier than spotting--like menstrual bleeding (spotting can be normal in early labor or after a check of your cervix) °4.  You do not feel the baby moving like he/she normally does ° ° °Abdominal Pain During Pregnancy °Abdominal pain is common in pregnancy. Most of the time, it does not cause harm. There are many causes of abdominal pain. Some causes are more serious than others. Some of the causes of abdominal pain in pregnancy are easily diagnosed. Occasionally, the diagnosis takes time to understand. Other times, the cause is not determined. Abdominal pain can be a sign that something is very wrong with the pregnancy, or the pain may have nothing to do with the pregnancy at all. For this reason, always tell your health care provider if you have any abdominal discomfort. °HOME CARE INSTRUCTIONS  °Monitor your abdominal pain for any changes. The following actions may help to alleviate any discomfort you are experiencing: °· Do not have sexual intercourse or put anything in your vagina until your symptoms go away completely. °· Get plenty of rest until your pain improves. °· Drink clear fluids if you feel nauseous. Avoid solid food as long as you are uncomfortable or nauseous. °· Only take over-the-counter or prescription medicine as directed by your health care provider. °· Keep all follow-up appointments with your health care provider. °SEEK IMMEDIATE MEDICAL CARE IF: °· You are bleeding, leaking fluid, or passing tissue from the vagina. °· You have increasing pain or cramping. °· You have persistent vomiting. °· You have painful or bloody urination. °· You have a fever. °· You notice a decrease  in your baby's movements. °· You have extreme weakness or feel faint. °· You have shortness of breath, with or without abdominal pain. °· You develop a severe headache with abdominal pain. °· You have abnormal vaginal discharge with abdominal pain. °· You have persistent diarrhea. °· You have abdominal pain that continues even after rest, or gets worse. °MAKE SURE YOU:  °· Understand these instructions. °· Will watch your condition. °· Will get help right away if you are not doing well or get worse. °  °This information is not intended to replace advice given to you by your health care provider. Make sure you discuss any questions you have with your health care provider. °  °Document Released: 10/29/2005 Document Revised: 08/19/2013 Document Reviewed: 05/28/2013 °Elsevier Interactive Patient Education ©2016 Elsevier Inc. ° °

## 2015-12-25 NOTE — Progress Notes (Signed)
BPP and AFI results reviewed with CNM. Will discharge home

## 2015-12-25 NOTE — MAU Note (Signed)
Leaking fld for a month. Came to hosp and r/o PROM. Leaking more fld since 1600. Clear fld. Having some lower back pain since 1600. Hx abruption but denies current bleediing. For repeat C/S 01/16/16

## 2016-01-03 NOTE — H&P (Signed)
[name], [name] NO.:  [id number]  MEDICAL RECORD NO.:  [id number]  LOCATION:  [facility]                          FACILITY:  [facility]  PHYSICIAN:  Duke Salvia. Marcelle Hamilton, M.D.DATE OF BIRTH:  [birth year]  DATE OF ADMISSION:  12/25/2015 DATE OF DISCHARGE:  12/25/2015                             HISTORY & PHYSICAL   CHIEF COMPLAINT:  Repeat cesarean section at term.  HISTORY OF PRESENT ILLNESS:  A 25 year old, G3, P1-0-1-1, EDD March 13, is scheduled for RCS, March 16.  First C-section in 2010 for an NRFHR. She declines TOL.  Presents for repeat cesarean section.  This procedure including specific risks related to bleeding, infection, transfusion, wound infection, phlebitis, adjacent organ injury, reviewed with her, which she understands and accepts.  First trimester screen was normal as was her AFP only, 1-hour GTT was 103.  In early December, was hospitalized for some spotting, was observed and did receive betamethasone series.  Subchorionic hemorrhage was documented, which has since resolved.  Last ultrasound on February 9, showed vertex presentation.  BPP 8/8.  No evidence of SCH.  PAST MEDICAL HISTORY:  Allergies:  None.  PAST SURGICAL HISTORY:  Prior cesarean in 2010.  The remainder of her past medical history, please see the [facility] form for details.  PHYSICAL EXAMINATION:  VITAL SIGNS:  Temp 98.2 and blood pressure 128/70. HEENT:  Unremarkable. NECK:  Supple without masses. LUNGS:  Clear. CARDIOVASCULAR:  Regular rate and rhythm without murmurs, rubs, gallops found. BREASTS:  Not examined. PELVIC:  Term fundal height.  Fetal heart rate 140.  Cervix was 1, 50% vertex. EXTREMITIES:  Unremarkable. NEUROLOGIC: Unremarkable.  IMPRESSION:  Term pregnancy, previous cesarean section, for repeat cesarean section.  Procedure and risks discussed as above.     [name], M.D.     RMH/MEDQ  D:  01/03/2016  T:  01/03/2016  Job:  [id number]

## 2016-01-03 NOTE — H&P (Signed)
[name]  DICTATION # [id number] CSN# [id number]   Meriel Pica, MD 01/03/2016 12:07 PM

## 2016-01-05 ENCOUNTER — Inpatient Hospital Stay (HOSPITAL_COMMUNITY)
Admission: AD | Admit: 2016-01-05 | Discharge: 2016-01-05 | Disposition: A | Discharge status: Home / Self Care | Payer: Managed Care, Other (non HMO) | Source: Ambulatory Visit | Attending: Obstetrics & Gynecology | Admitting: Obstetrics & Gynecology

## 2016-01-05 ENCOUNTER — Encounter (HOSPITAL_COMMUNITY): Payer: Self-pay | Admitting: *Deleted

## 2016-01-05 DIAGNOSIS — Z3483 Encounter for supervision of other normal pregnancy, third trimester: Secondary | ICD-10-CM | POA: Diagnosis present

## 2016-01-05 LAB — POCT FERN TEST

## 2016-01-05 LAB — AMNISURE RUPTURE OF MEMBRANE (ROM) NOT AT ARMC: AMNISURE: NEGATIVE

## 2016-01-05 MED ORDER — BUTALBITAL-APAP-CAFFEINE 50-325-40 MG PO TABS: 1.0000 | ORAL_TABLET | Freq: Four times a day (QID) | ORAL | Status: DC | PRN

## 2016-01-05 MED ORDER — BUTALBITAL-APAP-CAFFEINE 50-325-40 MG PO TABS
2.0000 | ORAL_TABLET | Freq: Once | ORAL | Status: AC
  Administered 2016-01-05: 2 via ORAL
  Filled 2016-01-05: qty 2

## 2016-01-05 NOTE — MAU Note (Signed)
Using monistat; ?SROM; fern test is negative;

## 2016-01-05 NOTE — Discharge Instructions (Signed)
Braxton Hicks Contractions °Contractions of the uterus can occur throughout pregnancy. Contractions are not always a sign that you are in labor.  °WHAT ARE BRAXTON HICKS CONTRACTIONS?  °Contractions that occur before labor are called Braxton Hicks contractions, or false labor. Toward the end of pregnancy (32-34 weeks), these contractions can develop more often and may become more forceful. This is not true labor because these contractions do not result in opening (dilatation) and thinning of the cervix. They are sometimes difficult to tell apart from true labor because these contractions can be forceful and people have different pain tolerances. You should not feel embarrassed if you go to the hospital with false labor. Sometimes, the only way to tell if you are in true labor is for your health care provider to look for changes in the cervix. °If there are no prenatal problems or other health problems associated with the pregnancy, it is completely safe to be sent home with false labor and await the onset of true labor. °HOW CAN YOU TELL THE DIFFERENCE BETWEEN TRUE AND FALSE LABOR? °False Labor °· The contractions of false labor are usually shorter and not as hard as those of true labor.   °· The contractions are usually irregular.   °· The contractions are often felt in the front of the lower abdomen and in the groin.   °· The contractions may go away when you walk around or change positions while lying down.   °· The contractions get weaker and are shorter lasting as time goes on.   °· The contractions do not usually become progressively stronger, regular, and closer together as with true labor.   °True Labor °· Contractions in true labor last 30-70 seconds, become very regular, usually become more intense, and increase in frequency.   °· The contractions do not go away with walking.   °· The discomfort is usually felt in the top of the uterus and spreads to the lower abdomen and low back.   °· True labor can be  determined by your health care provider with an exam. This will show that the cervix is dilating and getting thinner.   °WHAT TO REMEMBER °· Keep up with your usual exercises and follow other instructions given by your health care provider.   °· Take medicines as directed by your health care provider.   °· Keep your regular prenatal appointments.   °· Eat and drink lightly if you think you are going into labor.   °· If Braxton Hicks contractions are making you uncomfortable:   °¨ Change your position from lying down or resting to walking, or from walking to resting.   °¨ Sit and rest in a tub of warm water.   °¨ Drink 2-3 glasses of water. Dehydration may cause these contractions.   °¨ Do slow and deep breathing several times an hour.   °WHEN SHOULD I SEEK IMMEDIATE MEDICAL CARE? °Seek immediate medical care if: °· Your contractions become stronger, more regular, and closer together.   °· You have fluid leaking or gushing from your vagina.   °· You have a fever.   °· You pass blood-tinged mucus.   °· You have vaginal bleeding.   °· You have continuous abdominal pain.   °· You have low back pain that you never had before.   °· You feel your baby's head pushing down and causing pelvic pressure.   °· Your baby is not moving as much as it used to.   °  °This information is not intended to replace advice given to you by your health care provider. Make sure you discuss any questions you have with your health care   provider. °  °Document Released: 10/29/2005 Document Revised: 11/03/2013 Document Reviewed: 08/10/2013 °Elsevier Interactive Patient Education ©2016 [employer] ° °

## 2016-01-05 NOTE — MAU Note (Signed)
Contractions woke her around 0330. No bleeding, ? Leaking vs d/c- currently using monistat.  Was 2 cm on Monday

## 2016-01-05 NOTE — MAU Note (Addendum)
C/o ucs since 0300 this AM; ?SROM; scheduled for c-section on 01-16-16;

## 2016-01-12 ENCOUNTER — Encounter (HOSPITAL_COMMUNITY): Payer: Self-pay | Admitting: *Deleted

## 2016-01-12 ENCOUNTER — Inpatient Hospital Stay (HOSPITAL_COMMUNITY)
Admission: AD | Admit: 2016-01-12 | Discharge: 2016-01-12 | Disposition: A | Discharge status: Home / Self Care | Payer: Managed Care, Other (non HMO) | Source: Ambulatory Visit | Attending: Obstetrics and Gynecology | Admitting: Obstetrics and Gynecology

## 2016-01-12 DIAGNOSIS — Z3493 Encounter for supervision of normal pregnancy, unspecified, third trimester: Secondary | ICD-10-CM | POA: Insufficient documentation

## 2016-01-12 NOTE — Discharge Instructions (Signed)
Fetal Movement Counts  Patient Name: __________________________________________________ Patient Due Date: ____________________  Performing a fetal movement count is highly recommended in high-risk pregnancies, but it is good for every pregnant woman to do. Your health care provider may ask you to start counting fetal movements at 28 weeks of the pregnancy. Fetal movements often increase:  · After eating a full meal.  · After physical activity.  · After eating or drinking something sweet or cold.  · At rest.  Pay attention to when you feel the baby is most active. This will help you notice a pattern of your baby's sleep and wake cycles and what factors contribute to an increase in fetal movement. It is important to perform a fetal movement count at the same time each day when your baby is normally most active.   HOW TO COUNT FETAL MOVEMENTS  1. Find a quiet and comfortable area to sit or lie down on your left side. Lying on your left side provides the best blood and oxygen circulation to your baby.  2. Write down the day and time on a sheet of paper or in a journal.  3. Start counting kicks, flutters, swishes, rolls, or jabs in a 2-hour period. You should feel at least 10 movements within 2 hours.  4. If you do not feel 10 movements in 2 hours, wait 2-3 hours and count again. Look for a change in the pattern or not enough counts in 2 hours.  SEEK MEDICAL CARE IF:  · You feel less than 10 counts in 2 hours, tried twice.  · There is no movement in over an hour.  · The pattern is changing or taking longer each day to reach 10 counts in 2 hours.  · You feel the baby is not moving as he or she usually does.  Date: ____________ Movements: ____________ Start time: ____________ Finish time: ____________   Date: ____________ Movements: ____________ Start time: ____________ Finish time: ____________  Date: ____________ Movements: ____________ Start time: ____________ Finish time: ____________  Date: ____________ Movements:  ____________ Start time: ____________ Finish time: ____________  Date: ____________ Movements: ____________ Start time: ____________ Finish time: ____________  Date: ____________ Movements: ____________ Start time: ____________ Finish time: ____________  Date: ____________ Movements: ____________ Start time: ____________ Finish time: ____________  Date: ____________ Movements: ____________ Start time: ____________ Finish time: ____________   Date: ____________ Movements: ____________ Start time: ____________ Finish time: ____________  Date: ____________ Movements: ____________ Start time: ____________ Finish time: ____________  Date: ____________ Movements: ____________ Start time: ____________ Finish time: ____________  Date: ____________ Movements: ____________ Start time: ____________ Finish time: ____________  Date: ____________ Movements: ____________ Start time: ____________ Finish time: ____________  Date: ____________ Movements: ____________ Start time: ____________ Finish time: ____________  Date: ____________ Movements: ____________ Start time: ____________ Finish time: ____________   Date: ____________ Movements: ____________ Start time: ____________ Finish time: ____________  Date: ____________ Movements: ____________ Start time: ____________ Finish time: ____________  Date: ____________ Movements: ____________ Start time: ____________ Finish time: ____________  Date: ____________ Movements: ____________ Start time: ____________ Finish time: ____________  Date: ____________ Movements: ____________ Start time: ____________ Finish time: ____________  Date: ____________ Movements: ____________ Start time: ____________ Finish time: ____________  Date: ____________ Movements: ____________ Start time: ____________ Finish time: ____________   Date: ____________ Movements: ____________ Start time: ____________ Finish time: ____________  Date: ____________ Movements: ____________ Start time: ____________ Finish  time: ____________  Date: ____________ Movements: ____________ Start time: ____________ Finish time: ____________  Date: ____________ Movements: ____________ Start time:   ____________ Finish time: ____________  Date: ____________ Movements: ____________ Start time: ____________ Finish time: ____________  Date: ____________ Movements: ____________ Start time: ____________ Finish time: ____________  Date: ____________ Movements: ____________ Start time: ____________ Finish time: ____________   Date: ____________ Movements: ____________ Start time: ____________ Finish time: ____________  Date: ____________ Movements: ____________ Start time: ____________ Finish time: ____________  Date: ____________ Movements: ____________ Start time: ____________ Finish time: ____________  Date: ____________ Movements: ____________ Start time: ____________ Finish time: ____________  Date: ____________ Movements: ____________ Start time: ____________ Finish time: ____________  Date: ____________ Movements: ____________ Start time: ____________ Finish time: ____________  Date: ____________ Movements: ____________ Start time: ____________ Finish time: ____________   Date: ____________ Movements: ____________ Start time: ____________ Finish time: ____________  Date: ____________ Movements: ____________ Start time: ____________ Finish time: ____________  Date: ____________ Movements: ____________ Start time: ____________ Finish time: ____________  Date: ____________ Movements: ____________ Start time: ____________ Finish time: ____________  Date: ____________ Movements: ____________ Start time: ____________ Finish time: ____________  Date: ____________ Movements: ____________ Start time: ____________ Finish time: ____________  Date: ____________ Movements: ____________ Start time: ____________ Finish time: ____________   Date: ____________ Movements: ____________ Start time: ____________ Finish time: ____________  Date: ____________  Movements: ____________ Start time: ____________ Finish time: ____________  Date: ____________ Movements: ____________ Start time: ____________ Finish time: ____________  Date: ____________ Movements: ____________ Start time: ____________ Finish time: ____________  Date: ____________ Movements: ____________ Start time: ____________ Finish time: ____________  Date: ____________ Movements: ____________ Start time: ____________ Finish time: ____________  Date: ____________ Movements: ____________ Start time: ____________ Finish time: ____________   Date: ____________ Movements: ____________ Start time: ____________ Finish time: ____________  Date: ____________ Movements: ____________ Start time: ____________ Finish time: ____________  Date: ____________ Movements: ____________ Start time: ____________ Finish time: ____________  Date: ____________ Movements: ____________ Start time: ____________ Finish time: ____________  Date: ____________ Movements: ____________ Start time: ____________ Finish time: ____________  Date: ____________ Movements: ____________ Start time: ____________ Finish time: ____________     This information is not intended to replace advice given to you by your health care provider. Make sure you discuss any questions you have with your health care provider.     Document Released: 11/28/2006 Document Revised: 11/19/2014 Document Reviewed: 08/25/2012  [employer]vier Interactive Patient Education ©2016 [employer]

## 2016-01-12 NOTE — MAU Note (Signed)
Pt C/O severe pain & pressure in pelvis, also mild uc's.  Denies bleeding or LOF.

## 2016-01-12 NOTE — Progress Notes (Signed)
Dr Marcelle Overlie notified of pt's complaints. FHR tracing, contraction pattern and VE, orders received to discharge home

## 2016-01-13 ENCOUNTER — Encounter (HOSPITAL_COMMUNITY)
Admission: RE | Admit: 2016-01-13 | Discharge: 2016-01-13 | Disposition: A | Discharge status: Home / Self Care | Payer: Managed Care, Other (non HMO) | Source: Ambulatory Visit | Attending: Obstetrics and Gynecology | Admitting: Obstetrics and Gynecology

## 2016-01-13 ENCOUNTER — Encounter (HOSPITAL_COMMUNITY): Payer: Self-pay

## 2016-01-13 HISTORY — DX: Headache: R51

## 2016-01-13 HISTORY — DX: Gastro-esophageal reflux disease without esophagitis: K21.9

## 2016-01-13 HISTORY — DX: Headache, unspecified: R51.9

## 2016-01-13 LAB — TYPE AND SCREEN
ABO/RH(D): O POS
ANTIBODY SCREEN: NEGATIVE

## 2016-01-13 LAB — CBC
HEMATOCRIT: 33.1 % — AB (ref 36.0–46.0)
HEMOGLOBIN: 11.5 g/dL — AB (ref 12.0–15.0)
MCH: 30.5 pg (ref 26.0–34.0)
MCHC: 34.7 g/dL (ref 30.0–36.0)
MCV: 87.8 fL (ref 78.0–100.0)
Platelets: 230 10*3/uL (ref 150–400)
RBC: 3.77 MIL/uL — AB (ref 3.87–5.11)
RDW: 13.6 % (ref 11.5–15.5)
WBC: 8.4 10*3/uL (ref 4.0–10.5)

## 2016-01-13 NOTE — Patient Instructions (Signed)
Your procedure is scheduled on: Monday January 16, 2016  Enter through the Main Entrance of [facility] at: 11:30 am   Pick up the phone at the desk and dial [phone].  Call this number if you have problems the morning of surgery: [phone].  Remember: Do NOT eat food: after midnight on Sunday  Do NOT drink clear liquids after: 9:00 am Monday day of surgery  Take these medicines the morning of surgery with a SIP OF WATER: none   Do NOT wear jewelry (body piercing), metal hair clips/bobby pins, or nail polish. Do NOT wear lotions, powders, or perfumes.  You may wear deoderant. Do NOT shave for 48 hours prior to surgery. Do NOT bring valuables to the hospital.  Leave suitcase in car.  After surgery it may be brought to your room.  For patients admitted to the hospital, checkout time is 11:00 AM the day of discharge.

## 2016-01-14 LAB — RPR: RPR: NONREACTIVE

## 2016-01-16 ENCOUNTER — Encounter (HOSPITAL_COMMUNITY): Payer: Self-pay | Admitting: Emergency Medicine

## 2016-01-16 ENCOUNTER — Inpatient Hospital Stay (HOSPITAL_COMMUNITY): Payer: Managed Care, Other (non HMO) | Admitting: Anesthesiology

## 2016-01-16 ENCOUNTER — Encounter (HOSPITAL_COMMUNITY)
Admission: RE | Disposition: A | Discharge status: Home / Self Care | Payer: Self-pay | Source: Ambulatory Visit | Attending: Obstetrics and Gynecology

## 2016-01-16 ENCOUNTER — Inpatient Hospital Stay (HOSPITAL_COMMUNITY)
Admission: RE | Admit: 2016-01-16 | Discharge: 2016-01-18 | DRG: 766 | Disposition: A | Discharge status: Home / Self Care | Payer: Managed Care, Other (non HMO) | Source: Ambulatory Visit | Attending: Obstetrics and Gynecology | Admitting: Obstetrics and Gynecology

## 2016-01-16 DIAGNOSIS — O99334 Smoking (tobacco) complicating childbirth: Secondary | ICD-10-CM | POA: Diagnosis present

## 2016-01-16 DIAGNOSIS — K219 Gastro-esophageal reflux disease without esophagitis: Secondary | ICD-10-CM | POA: Diagnosis present

## 2016-01-16 DIAGNOSIS — O99214 Obesity complicating childbirth: Secondary | ICD-10-CM | POA: Diagnosis present

## 2016-01-16 DIAGNOSIS — E669 Obesity, unspecified: Secondary | ICD-10-CM | POA: Diagnosis present

## 2016-01-16 DIAGNOSIS — O34211 Maternal care for low transverse scar from previous cesarean delivery: Secondary | ICD-10-CM | POA: Diagnosis present

## 2016-01-16 DIAGNOSIS — Z3A39 39 weeks gestation of pregnancy: Secondary | ICD-10-CM

## 2016-01-16 DIAGNOSIS — Z6832 Body mass index (BMI) 32.0-32.9, adult: Secondary | ICD-10-CM

## 2016-01-16 DIAGNOSIS — O9962 Diseases of the digestive system complicating childbirth: Secondary | ICD-10-CM | POA: Diagnosis present

## 2016-01-16 DIAGNOSIS — O34219 Maternal care for unspecified type scar from previous cesarean delivery: Secondary | ICD-10-CM

## 2016-01-16 DIAGNOSIS — Z3490 Encounter for supervision of normal pregnancy, unspecified, unspecified trimester: Secondary | ICD-10-CM

## 2016-01-16 SURGERY — Surgical Case
Anesthesia: Spinal | Site: Abdomen

## 2016-01-16 MED ORDER — MENTHOL 3 MG MT LOZG: 1.0000 | LOZENGE | OROMUCOSAL | Status: DC | PRN

## 2016-01-16 MED ORDER — SENNOSIDES-DOCUSATE SODIUM 8.6-50 MG PO TABS
2.0000 | ORAL_TABLET | ORAL | Status: DC
  Administered 2016-01-17 (×2): 2 via ORAL
  Filled 2016-01-16 (×2): qty 2

## 2016-01-16 MED ORDER — WITCH HAZEL-GLYCERIN EX PADS: 1.0000 "application " | MEDICATED_PAD | CUTANEOUS | Status: DC | PRN

## 2016-01-16 MED ORDER — MEPERIDINE HCL 25 MG/ML IJ SOLN: 6.2500 mg | INTRAMUSCULAR | Status: DC | PRN

## 2016-01-16 MED ORDER — FENTANYL CITRATE (PF) 100 MCG/2ML IJ SOLN: 25.0000 ug | INTRAMUSCULAR | Status: DC | PRN

## 2016-01-16 MED ORDER — ONDANSETRON HCL 4 MG/2ML IJ SOLN: 4.0000 mg | Freq: Three times a day (TID) | INTRAMUSCULAR | Status: DC | PRN

## 2016-01-16 MED ORDER — SCOPOLAMINE 1 MG/3DAYS TD PT72: 1.0000 | MEDICATED_PATCH | Freq: Once | TRANSDERMAL | Status: DC

## 2016-01-16 MED ORDER — SCOPOLAMINE 1 MG/3DAYS TD PT72
MEDICATED_PATCH | TRANSDERMAL | Status: AC
  Administered 2016-01-16: 1.5 mg via TRANSDERMAL
  Filled 2016-01-16: qty 1

## 2016-01-16 MED ORDER — KETOROLAC TROMETHAMINE 30 MG/ML IJ SOLN
30.0000 mg | Freq: Four times a day (QID) | INTRAMUSCULAR | Status: DC | PRN
  Administered 2016-01-16: 30 mg via INTRAMUSCULAR

## 2016-01-16 MED ORDER — MORPHINE SULFATE (PF) 0.5 MG/ML IJ SOLN
INTRAMUSCULAR | Status: DC | PRN
  Administered 2016-01-16: .2 mg via INTRATHECAL

## 2016-01-16 MED ORDER — FENTANYL CITRATE (PF) 100 MCG/2ML IJ SOLN
INTRAMUSCULAR | Status: AC
  Filled 2016-01-16: qty 2

## 2016-01-16 MED ORDER — TETANUS-DIPHTH-ACELL PERTUSSIS 5-2.5-18.5 LF-MCG/0.5 IM SUSP: 0.5000 mL | Freq: Once | INTRAMUSCULAR | Status: DC

## 2016-01-16 MED ORDER — KETOROLAC TROMETHAMINE 30 MG/ML IJ SOLN: 30.0000 mg | Freq: Four times a day (QID) | INTRAMUSCULAR | Status: DC | PRN

## 2016-01-16 MED ORDER — SODIUM CHLORIDE 0.9% FLUSH: 3.0000 mL | Freq: Two times a day (BID) | INTRAVENOUS | Status: DC

## 2016-01-16 MED ORDER — SODIUM CHLORIDE 0.9 % IV SOLN: 250.0000 mL | INTRAVENOUS | Status: DC

## 2016-01-16 MED ORDER — OXYTOCIN 10 UNIT/ML IJ SOLN: 2.5000 [IU]/h | INTRAVENOUS | Status: AC

## 2016-01-16 MED ORDER — KETOROLAC TROMETHAMINE 30 MG/ML IJ SOLN
INTRAMUSCULAR | Status: AC
  Administered 2016-01-16: 30 mg via INTRAMUSCULAR
  Filled 2016-01-16: qty 1

## 2016-01-16 MED ORDER — ONDANSETRON HCL 4 MG/2ML IJ SOLN
INTRAMUSCULAR | Status: DC | PRN
Start: 2016-01-16 — End: 2016-01-16
  Administered 2016-01-16: 4 mg via INTRAVENOUS

## 2016-01-16 MED ORDER — NALBUPHINE HCL 10 MG/ML IJ SOLN: 5.0000 mg | Freq: Once | INTRAMUSCULAR | Status: DC | PRN

## 2016-01-16 MED ORDER — FENTANYL CITRATE (PF) 100 MCG/2ML IJ SOLN
INTRAMUSCULAR | Status: DC | PRN
  Administered 2016-01-16: 10 ug via INTRATHECAL

## 2016-01-16 MED ORDER — SIMETHICONE 80 MG PO CHEW: 80.0000 mg | CHEWABLE_TABLET | ORAL | Status: DC | PRN

## 2016-01-16 MED ORDER — LACTATED RINGERS IV SOLN
INTRAVENOUS | Status: DC | PRN
  Administered 2016-01-16: 13:00:00 via INTRAVENOUS

## 2016-01-16 MED ORDER — DIPHENHYDRAMINE HCL 50 MG/ML IJ SOLN: 12.5000 mg | INTRAMUSCULAR | Status: DC | PRN

## 2016-01-16 MED ORDER — PHENYLEPHRINE 8 MG IN D5W 100 ML (0.08MG/ML) PREMIX OPTIME
INJECTION | INTRAVENOUS | Status: DC | PRN
  Administered 2016-01-16: 60 ug/min via INTRAVENOUS

## 2016-01-16 MED ORDER — LACTATED RINGERS IV SOLN
Freq: Once | INTRAVENOUS | Status: AC
  Administered 2016-01-16: 12:00:00 via INTRAVENOUS

## 2016-01-16 MED ORDER — LANOLIN HYDROUS EX OINT: 1.0000 "application " | TOPICAL_OINTMENT | CUTANEOUS | Status: DC | PRN

## 2016-01-16 MED ORDER — SIMETHICONE 80 MG PO CHEW
80.0000 mg | CHEWABLE_TABLET | ORAL | Status: DC
  Administered 2016-01-17 – 2016-01-18 (×2): 80 mg via ORAL
  Filled 2016-01-16 (×2): qty 1

## 2016-01-16 MED ORDER — NALOXONE HCL 2 MG/2ML IJ SOSY: 1.0000 ug/kg/h | PREFILLED_SYRINGE | INTRAVENOUS | Status: DC | PRN

## 2016-01-16 MED ORDER — HYDROMORPHONE HCL 1 MG/ML IJ SOLN
0.5000 mg | Freq: Once | INTRAMUSCULAR | Status: AC
  Administered 2016-01-16: 0.5 mg via INTRAVENOUS
  Filled 2016-01-16: qty 1

## 2016-01-16 MED ORDER — NALBUPHINE HCL 10 MG/ML IJ SOLN: 5.0000 mg | INTRAMUSCULAR | Status: DC | PRN

## 2016-01-16 MED ORDER — PHENYLEPHRINE 40 MCG/ML (10ML) SYRINGE FOR IV PUSH (FOR BLOOD PRESSURE SUPPORT)
PREFILLED_SYRINGE | INTRAVENOUS | Status: AC
  Filled 2016-01-16: qty 10

## 2016-01-16 MED ORDER — BISACODYL 10 MG RE SUPP: 10.0000 mg | Freq: Every day | RECTAL | Status: DC | PRN

## 2016-01-16 MED ORDER — ONDANSETRON HCL 4 MG/2ML IJ SOLN: 4.0000 mg | Freq: Once | INTRAMUSCULAR | Status: DC | PRN

## 2016-01-16 MED ORDER — ONDANSETRON HCL 4 MG/2ML IJ SOLN
INTRAMUSCULAR | Status: AC
  Filled 2016-01-16: qty 2

## 2016-01-16 MED ORDER — DIBUCAINE 1 % RE OINT: 1.0000 "application " | TOPICAL_OINTMENT | RECTAL | Status: DC | PRN

## 2016-01-16 MED ORDER — MEASLES, MUMPS & RUBELLA VAC ~~LOC~~ INJ: 0.5000 mL | INJECTION | Freq: Once | SUBCUTANEOUS | Status: DC

## 2016-01-16 MED ORDER — ZOLPIDEM TARTRATE 5 MG PO TABS: 5.0000 mg | ORAL_TABLET | Freq: Every evening | ORAL | Status: DC | PRN

## 2016-01-16 MED ORDER — ACETAMINOPHEN 500 MG PO TABS
1000.0000 mg | ORAL_TABLET | Freq: Four times a day (QID) | ORAL | Status: AC
  Administered 2016-01-16 – 2016-01-17 (×2): 1000 mg via ORAL
  Filled 2016-01-16 (×2): qty 2

## 2016-01-16 MED ORDER — DIPHENHYDRAMINE HCL 25 MG PO CAPS: 25.0000 mg | ORAL_CAPSULE | Freq: Four times a day (QID) | ORAL | Status: DC | PRN

## 2016-01-16 MED ORDER — PHENYLEPHRINE 8 MG IN D5W 100 ML (0.08MG/ML) PREMIX OPTIME
INJECTION | INTRAVENOUS | Status: AC
  Filled 2016-01-16: qty 100

## 2016-01-16 MED ORDER — IBUPROFEN 800 MG PO TABS
800.0000 mg | ORAL_TABLET | Freq: Three times a day (TID) | ORAL | Status: DC | PRN
  Administered 2016-01-16 – 2016-01-18 (×4): 800 mg via ORAL
  Filled 2016-01-16 (×5): qty 1

## 2016-01-16 MED ORDER — OXYTOCIN 10 UNIT/ML IJ SOLN
INTRAMUSCULAR | Status: AC
  Filled 2016-01-16: qty 4

## 2016-01-16 MED ORDER — SIMETHICONE 80 MG PO CHEW
80.0000 mg | CHEWABLE_TABLET | Freq: Three times a day (TID) | ORAL | Status: DC
  Administered 2016-01-16 – 2016-01-18 (×4): 80 mg via ORAL
  Filled 2016-01-16 (×4): qty 1

## 2016-01-16 MED ORDER — OXYTOCIN 40 UNITS IN LACTATED RINGERS INFUSION - SIMPLE MED
INTRAVENOUS | Status: DC | PRN
  Administered 2016-01-16: 40 [IU] via INTRAVENOUS

## 2016-01-16 MED ORDER — LACTATED RINGERS IV SOLN
INTRAVENOUS | Status: DC
  Administered 2016-01-16 – 2016-01-17 (×2): via INTRAVENOUS

## 2016-01-16 MED ORDER — NALOXONE HCL 0.4 MG/ML IJ SOLN: 0.4000 mg | INTRAMUSCULAR | Status: DC | PRN

## 2016-01-16 MED ORDER — OXYCODONE-ACETAMINOPHEN 5-325 MG PO TABS
2.0000 | ORAL_TABLET | ORAL | Status: DC | PRN
  Administered 2016-01-17 – 2016-01-18 (×4): 2 via ORAL
  Filled 2016-01-16 (×3): qty 2

## 2016-01-16 MED ORDER — OXYCODONE-ACETAMINOPHEN 5-325 MG PO TABS
1.0000 | ORAL_TABLET | ORAL | Status: DC | PRN
  Administered 2016-01-17 – 2016-01-18 (×2): 1 via ORAL
  Filled 2016-01-16 (×4): qty 1

## 2016-01-16 MED ORDER — FLEET ENEMA 7-19 GM/118ML RE ENEM: 1.0000 | ENEMA | Freq: Every day | RECTAL | Status: DC | PRN

## 2016-01-16 MED ORDER — SODIUM CHLORIDE 0.9% FLUSH: 3.0000 mL | INTRAVENOUS | Status: DC | PRN

## 2016-01-16 MED ORDER — BUPIVACAINE IN DEXTROSE 0.75-8.25 % IT SOLN
INTRATHECAL | Status: DC | PRN
  Administered 2016-01-16: 1.6 mL via INTRATHECAL

## 2016-01-16 MED ORDER — CEFAZOLIN SODIUM-DEXTROSE 2-3 GM-% IV SOLR
INTRAVENOUS | Status: AC
  Filled 2016-01-16: qty 50

## 2016-01-16 MED ORDER — MORPHINE SULFATE (PF) 0.5 MG/ML IJ SOLN
INTRAMUSCULAR | Status: AC
  Filled 2016-01-16: qty 10

## 2016-01-16 MED ORDER — SCOPOLAMINE 1 MG/3DAYS TD PT72
1.0000 | MEDICATED_PATCH | Freq: Once | TRANSDERMAL | Status: DC
  Administered 2016-01-16: 1.5 mg via TRANSDERMAL

## 2016-01-16 MED ORDER — CEFAZOLIN SODIUM-DEXTROSE 2-3 GM-% IV SOLR
2.0000 g | INTRAVENOUS | Status: AC
  Administered 2016-01-16: 2 g via INTRAVENOUS

## 2016-01-16 MED ORDER — ACETAMINOPHEN 325 MG PO TABS: 650.0000 mg | ORAL_TABLET | ORAL | Status: DC | PRN

## 2016-01-16 MED ORDER — LACTATED RINGERS IV SOLN
INTRAVENOUS | Status: DC
  Administered 2016-01-16 (×4): via INTRAVENOUS

## 2016-01-16 MED ORDER — DIPHENHYDRAMINE HCL 25 MG PO CAPS: 25.0000 mg | ORAL_CAPSULE | ORAL | Status: DC | PRN

## 2016-01-16 MED ORDER — PRENATAL MULTIVITAMIN CH
1.0000 | ORAL_TABLET | Freq: Every day | ORAL | Status: DC
  Administered 2016-01-17: 1 via ORAL
  Filled 2016-01-16: qty 1

## 2016-01-16 SURGICAL SUPPLY — 29 items
CLAMP CORD UMBIL (MISCELLANEOUS) IMPLANT
CLOSURE WOUND 1/2 X4 (GAUZE/BANDAGES/DRESSINGS) ×1
CLOTH BEACON ORANGE TIMEOUT ST (SAFETY) ×3 IMPLANT
DRSG OPSITE POSTOP 4X10 (GAUZE/BANDAGES/DRESSINGS) ×3 IMPLANT
DURAPREP 26ML APPLICATOR (WOUND CARE) ×3 IMPLANT
ELECT REM PT RETURN 9FT ADLT (ELECTROSURGICAL) ×3
ELECTRODE REM PT RTRN 9FT ADLT (ELECTROSURGICAL) ×1 IMPLANT
EXTRACTOR VACUUM M CUP 4 TUBE (SUCTIONS) IMPLANT
EXTRACTOR VACUUM M CUP 4' TUBE (SUCTIONS)
GLOVE BIO SURGEON STRL SZ7 (GLOVE) ×3 IMPLANT
GLOVE BIOGEL PI IND STRL 7.0 (GLOVE) ×1 IMPLANT
GLOVE BIOGEL PI INDICATOR 7.0 (GLOVE) ×2
GOWN STRL REUS W/TWL LRG LVL3 (GOWN DISPOSABLE) ×6 IMPLANT
KIT ABG SYR 3ML LUER SLIP (SYRINGE) IMPLANT
NEEDLE HYPO 25X5/8 SAFETYGLIDE (NEEDLE) ×3 IMPLANT
NS IRRIG 1000ML POUR BTL (IV SOLUTION) ×3 IMPLANT
PACK C SECTION WH (CUSTOM PROCEDURE TRAY) ×3 IMPLANT
PAD ABD 8X7 1/2 STERILE (GAUZE/BANDAGES/DRESSINGS) ×3 IMPLANT
PAD OB MATERNITY 4.3X12.25 (PERSONAL CARE ITEMS) ×3 IMPLANT
PENCIL SMOKE EVAC W/HOLSTER (ELECTROSURGICAL) ×3 IMPLANT
SPONGE GAUZE 4X4 12PLY STER LF (GAUZE/BANDAGES/DRESSINGS) ×6 IMPLANT
STRIP CLOSURE SKIN 1/2X4 (GAUZE/BANDAGES/DRESSINGS) ×2 IMPLANT
SUT CHROMIC 0 CTX 36 (SUTURE) ×9 IMPLANT
SUT MON AB 4-0 PS1 27 (SUTURE) ×3 IMPLANT
SUT PDS AB 0 CT1 27 (SUTURE) ×6 IMPLANT
SUT VIC AB 3-0 CT1 27 (SUTURE) ×6
SUT VIC AB 3-0 CT1 TAPERPNT 27 (SUTURE) ×2 IMPLANT
TOWEL OR 17X24 6PK STRL BLUE (TOWEL DISPOSABLE) ×3 IMPLANT
TRAY FOLEY CATH SILVER 14FR (SET/KITS/TRAYS/PACK) ×3 IMPLANT

## 2016-01-16 NOTE — Anesthesia Preprocedure Evaluation (Signed)
Anesthesia Evaluation  Patient identified by MRN, date of birth, ID band Patient awake    Reviewed: Allergy & Precautions, NPO status , Patient's Chart, lab work & pertinent test results  History of Anesthesia Complications Negative for: history of anesthetic complications  Airway Mallampati: II  TM Distance: >3 FB Neck ROM: Full    Dental no notable dental hx. (+) Dental Advisory Given   Pulmonary Current Smoker,    Pulmonary exam normal breath sounds clear to auscultation       Cardiovascular negative cardio ROS Normal cardiovascular exam Rhythm:Regular Rate:Normal     Neuro/Psych  Headaches, negative psych ROS   GI/Hepatic Neg liver ROS, GERD  Medicated and Controlled,  Endo/Other  obesity  Renal/GU negative Renal ROS  negative genitourinary   Musculoskeletal negative musculoskeletal ROS (+)   Abdominal   Peds negative pediatric ROS (+)  Hematology negative hematology ROS (+)   Anesthesia Other Findings   Reproductive/Obstetrics (+) Pregnancy Prior C/S x1                             Anesthesia Physical Anesthesia Plan  ASA: II  Anesthesia Plan: Spinal   Post-op Pain Management:    Induction:   Airway Management Planned:   Additional Equipment:   Intra-op Plan:   Post-operative Plan:   Informed Consent: I have reviewed the patients History and Physical, chart, labs and discussed the procedure including the risks, benefits and alternatives for the proposed anesthesia with the patient or authorized representative who has indicated his/her understanding and acceptance.   Dental advisory given  Plan Discussed with: CRNA  Anesthesia Plan Comments:         Anesthesia Quick Evaluation

## 2016-01-16 NOTE — Anesthesia Procedure Notes (Signed)
Spinal Patient location during procedure: OR Staffing Anesthesiologist: Ameila Weldon Performed by: anesthesiologist  Preanesthetic Checklist Completed: patient identified, site marked, surgical consent, pre-op evaluation, timeout performed, IV checked, risks and benefits discussed and monitors and equipment checked Spinal Block Patient position: sitting Prep: DuraPrep Patient monitoring: continuous pulse ox, blood pressure and heart rate Approach: midline Injection technique: single-shot Needle Needle type: Pencan  Needle gauge: 24 G Needle length: 9 cm Additional Notes Functioning IV was confirmed and monitors were applied. Sterile prep and drape, including hand hygiene, mask and sterile gloves were used. The patient was positioned and the spine was prepped. The skin was anesthetized with lidocaine.  Free flow of clear CSF was obtained prior to injecting local anesthetic into the CSF.  The spinal needle aspirated freely following injection.  The needle was carefully withdrawn.  The patient tolerated the procedure well. Consent was obtained prior to procedure with all questions answered and concerns addressed. Risks including but not limited to bleeding, infection, nerve damage, paralysis, failed block, inadequate analgesia, allergic reaction, high spinal, itching and headache were discussed and the patient wished to proceed.   Maan Zarcone, MD     

## 2016-01-16 NOTE — Consult Note (Signed)
Neonatology Note:   Attendance at C-section:    I was asked by Dr. Marcelle Overlie to attend this repeat C/S at term. The mother is a G3P1A1 O pos, GBS positive with cigarette smoking. ROM at delivery, fluid clear. Delayed cord clamping was done.  Infant vigorous with good spontaneous cry and tone. Needed only minimal bulb suctioning. Ap 9/9. Lungs clear to ausc in DR. To CN to care of Pediatrician.   Doretha Sou, MD

## 2016-01-16 NOTE — Anesthesia Postprocedure Evaluation (Signed)
Anesthesia Post Note  Patient: [name]  Procedure(s) Performed: Procedure(s) (LRB): CESAREAN SECTION (N/A)  Patient location during evaluation: Mother Baby Anesthesia Type: Spinal Level of consciousness: oriented and awake and alert Pain management: pain level controlled Vital Signs Assessment: post-procedure vital signs reviewed and stable Respiratory status: spontaneous breathing and respiratory function stable Cardiovascular status: blood pressure returned to baseline and stable Postop Assessment: no headache, no backache, no signs of nausea or vomiting and adequate PO intake Anesthetic complications: no    Last Vitals:  Filed Vitals:   01/16/16 1745 01/16/16 1845  BP: 116/55 120/68  Pulse: 63 67  Temp: 36.5 C 36.8 C  Resp: 18 18    Last Pain:  Filed Vitals:   01/16/16 2042  PainSc: 3                  Dannell Raczkowski

## 2016-01-16 NOTE — Addendum Note (Signed)
Addendum  created 01/16/16 2206 by Junious Silk, CRNA   Modules edited: Clinical Notes   Clinical Notes:  File: [id number]

## 2016-01-16 NOTE — Transfer of Care (Signed)
Immediate Anesthesia Transfer of Care Note  Patient: [name]  Procedure(s) Performed: Procedure(s) with comments: CESAREAN SECTION (N/A) - Repeat edc 01/23/16 NKDA  Patient Location: PACU  Anesthesia Type:Spinal  Level of Consciousness: awake, alert  and oriented  Airway & Oxygen Therapy: Patient Spontanous Breathing  Post-op Assessment: Report given to RN and Post -op Vital signs reviewed and stable  Post vital signs: Reviewed and stable  Last Vitals:  Filed Vitals:   01/16/16 1220 01/16/16 1400  BP: 119/65 107/64  Pulse: 81 78  Temp: 37 C 36.4 C  Resp: 18 18    Complications: No apparent anesthesia complications

## 2016-01-16 NOTE — Progress Notes (Signed)
The patient was re-examined with no change in status 

## 2016-01-16 NOTE — Anesthesia Postprocedure Evaluation (Signed)
Anesthesia Post Note  Patient: Shilpa Spoonemore Chai  Procedure(s) Performed: Procedure(s) (LRB): CESAREAN SECTION (N/A)  Patient location during evaluation: PACU Anesthesia Type: Spinal Level of consciousness: awake and alert Pain management: pain level controlled Vital Signs Assessment: post-procedure vital signs reviewed and stable Respiratory status: spontaneous breathing, nonlabored ventilation, respiratory function stable and patient connected to nasal cannula oxygen Cardiovascular status: blood pressure returned to baseline and stable Postop Assessment: no signs of nausea or vomiting, spinal receding and patient able to bend at knees Anesthetic complications: no    Last Vitals:  Filed Vitals:   01/16/16 1530 01/16/16 1645  BP: 112/73 113/65  Pulse: 67 66  Temp: 36.4 C   Resp:  17    Last Pain:  Filed Vitals:   01/16/16 1648  PainSc: 6                  Hareem Surowiec JENNETTE

## 2016-01-16 NOTE — Op Note (Signed)
Preoperative diagnosis: Repeat cesarean section at term  Postoperative diagnosis: Same  Procedure: Repeat low transverse cesarean section  Surgeon: Marcelle Overlie  Anesthesia: Spinal  EBL: 700 cc  Procedure and findings:  Patient taken the operating room after an adequate level of spinal anesthetic was obtained with the patient in left tilt position the abdomen prepped and draped in usual fashion Foley catheter positioned. Appropriate timeouts taken at that point. After prepping and draping, transverse incision made tibial scar which was excised, this is carried out of the fascia which was incised and extended transversely. Rectus muscle divided in the midline peritoneum entered superiorly. Bladder blade was positioned, the vesicouterine serosa was incised and the bladder was bluntly and sharply dissected below., Bladder blade repositioned transverse incision made in the lower segment extended with bandage scissors clear fluid noted the patient then delivered of a healthy female Apgars 9 and 9 the infant was suctioned, cord clamped and passed the pediatric team for further care. The placenta was then delivered manually intact, uterus exteriorized, cavity wiped clean with laparotomy pack. Closure obtained the first layer of 0 chromic in a locked fashion followed by number K layer of 0 chromic. This is hemostatic the bladder flap area was intact and hemostatic bilateral tubes and ovaries were normal. Prior to closure sponge, needle, history counts reported as correct 2. Peritoneum closed with a 3-0 Vicryl running suture as was the rectus muscles in the midline. 0 PDS from laterally to midline on either side for the fascia. Subcutaneous tissue was fairly thin it was undermined to reduce tension for better closure this is made hemostatic with the Bovie and the skin closure with 4-0 Monocryl subcuticular with pressure dressing. Clear urine noted in the case she tolerated this well went to recovery room in good  condition.  Dictated with [facility]  Khris Jansson Milana Obey M.D.

## 2016-01-17 ENCOUNTER — Encounter (HOSPITAL_COMMUNITY): Payer: Self-pay | Admitting: Obstetrics and Gynecology

## 2016-01-17 LAB — CBC
HCT: 28.2 % — ABNORMAL LOW (ref 36.0–46.0)
HEMOGLOBIN: 9.7 g/dL — AB (ref 12.0–15.0)
MCH: 30.8 pg (ref 26.0–34.0)
MCHC: 34.4 g/dL (ref 30.0–36.0)
MCV: 89.5 fL (ref 78.0–100.0)
Platelets: 181 10*3/uL (ref 150–400)
RBC: 3.15 MIL/uL — AB (ref 3.87–5.11)
RDW: 13.1 % (ref 11.5–15.5)
WBC: 8.8 10*3/uL (ref 4.0–10.5)

## 2016-01-17 LAB — BIRTH TISSUE RECOVERY COLLECTION (PLACENTA DONATION)

## 2016-01-17 NOTE — Progress Notes (Signed)
Subjective: Postpartum Day 1: Cesarean Delivery Patient reports tolerating PO.    Objective: Vital signs in last 24 hours: Temp:  [97.5 F (36.4 C)-98.6 F (37 C)] 98 F (36.7 C) (03/07 0630) Pulse Rate:  [63-95] 64 (03/07 0630) Resp:  [15-23] 18 (03/07 0630) BP: (106-120)/(55-73) 117/56 mmHg (03/07 0630) SpO2:  [96 %-100 %] 97 % (03/07 0630)  Physical Exam:  General: alert and cooperative Lochia: appropriate Uterine Fundus: firm Incision: abd dressing CDI DVT Evaluation: No evidence of DVT seen on physical exam. Negative Homan's sign. No cords or calf tenderness.   Recent Labs  01/17/16 0459  HGB 9.7*  HCT 28.2*    Assessment/Plan: Status post Cesarean section. Doing well postoperatively.  Continue current care.  [name] G 01/17/2016, 8:05 AM

## 2016-01-18 DIAGNOSIS — Z3490 Encounter for supervision of normal pregnancy, unspecified, unspecified trimester: Secondary | ICD-10-CM

## 2016-01-18 NOTE — Discharge Summary (Signed)
Obstetric Discharge Summary Reason for Admission: cesarean section Prenatal Procedures: ultrasound Intrapartum Procedures: cesarean: low cervical, transverse Postpartum Procedures: none Complications-Operative and Postpartum: none HEMOGLOBIN  Date Value Ref Range Status  01/17/2016 9.7* 12.0 - 15.0 g/dL Final   HCT  Date Value Ref Range Status  01/17/2016 28.2* 36.0 - 46.0 % Final    Physical Exam:  General: alert and cooperative Lochia: appropriate Uterine Fundus: firm Incision: healing well DVT Evaluation: No evidence of DVT seen on physical exam. Negative Homan's sign. No cords or calf tenderness. No significant calf/ankle edema.  Discharge Diagnoses: Term Pregnancy-delivered  Discharge Information: Date: 01/18/2016 Activity: pelvic rest Diet: routine Medications: PNV, Ibuprofen and Percocet Condition: stable Instructions: refer to practice specific booklet Discharge to: home   Newborn Data: Live born female  Birth Weight: 6 lb 8.9 oz (2975 g) APGAR: 9, 9  Home with mother.  [name] G 01/18/2016, 8:03 AM

## 2016-01-18 NOTE — Progress Notes (Signed)
Subjective: Postpartum Day 2: Cesarean Delivery Patient reports tolerating PO, + flatus and no problems voiding.    Objective: Vital signs in last 24 hours: Temp:  [98.1 F (36.7 C)-98.4 F (36.9 C)] 98.4 F (36.9 C) (03/08 0607) Pulse Rate:  [65-88] 88 (03/08 0607) Resp:  [18] 18 (03/08 0607) BP: (110-129)/(50-75) 129/75 mmHg (03/08 0607) SpO2:  [99 %-100 %] 99 % (03/07 1752)  Physical Exam:  General: alert and cooperative Lochia: appropriate Uterine Fundus: firm Incision: healing well DVT Evaluation: No evidence of DVT seen on physical exam. Negative Homan's sign. No cords or calf tenderness. No significant calf/ankle edema.   Recent Labs  01/17/16 0459  HGB 9.7*  HCT 28.2*    Assessment/Plan: Status post Cesarean section. Doing well postoperatively.  Discharge home with standard precautions and return to clinic in 1-2 weeks. Unable to compete discharge orders,as admission orders have not been completed Corrie Brannen G 01/18/2016, 7:55 AM

## 2016-09-12 ENCOUNTER — Encounter (HOSPITAL_COMMUNITY): Payer: Self-pay | Admitting: Emergency Medicine

## 2016-09-12 ENCOUNTER — Emergency Department (HOSPITAL_COMMUNITY)
Admission: EM | Admit: 2016-09-12 | Discharge: 2016-09-12 | Disposition: A | Discharge status: Home / Self Care | Payer: Managed Care, Other (non HMO) | Attending: Emergency Medicine | Admitting: Emergency Medicine

## 2016-09-12 ENCOUNTER — Emergency Department (HOSPITAL_COMMUNITY): Payer: Managed Care, Other (non HMO)

## 2016-09-12 DIAGNOSIS — R079 Chest pain, unspecified: Secondary | ICD-10-CM | POA: Diagnosis present

## 2016-09-12 DIAGNOSIS — Z79899 Other long term (current) drug therapy: Secondary | ICD-10-CM | POA: Insufficient documentation

## 2016-09-12 DIAGNOSIS — F17210 Nicotine dependence, cigarettes, uncomplicated: Secondary | ICD-10-CM | POA: Insufficient documentation

## 2016-09-12 DIAGNOSIS — M940 Chondrocostal junction syndrome [Tietze]: Secondary | ICD-10-CM | POA: Insufficient documentation

## 2016-09-12 LAB — CBC
HCT: 34.6 % — ABNORMAL LOW (ref 36.0–46.0)
Hemoglobin: 11.8 g/dL — ABNORMAL LOW (ref 12.0–15.0)
MCH: 28.6 pg (ref 26.0–34.0)
MCHC: 34.1 g/dL (ref 30.0–36.0)
MCV: 83.8 fL (ref 78.0–100.0)
PLATELETS: 262 10*3/uL (ref 150–400)
RBC: 4.13 MIL/uL (ref 3.87–5.11)
RDW: 12.5 % (ref 11.5–15.5)
WBC: 7.4 10*3/uL (ref 4.0–10.5)

## 2016-09-12 LAB — BASIC METABOLIC PANEL
Anion gap: 4 — ABNORMAL LOW (ref 5–15)
BUN: 10 mg/dL (ref 6–20)
CALCIUM: 9.4 mg/dL (ref 8.9–10.3)
CO2: 28 mmol/L (ref 22–32)
CREATININE: 0.85 mg/dL (ref 0.44–1.00)
Chloride: 107 mmol/L (ref 101–111)
Glucose, Bld: 83 mg/dL (ref 65–99)
Potassium: 3.6 mmol/L (ref 3.5–5.1)
SODIUM: 139 mmol/L (ref 135–145)

## 2016-09-12 LAB — I-STAT TROPONIN, ED: TROPONIN I, POC: 0 ng/mL (ref 0.00–0.08)

## 2016-09-12 MED ORDER — IBUPROFEN 800 MG PO TABS
800.0000 mg | ORAL_TABLET | Freq: Once | ORAL | Status: AC
  Administered 2016-09-12: 800 mg via ORAL
  Filled 2016-09-12: qty 1

## 2016-09-12 MED ORDER — NAPROXEN 500 MG PO TABS: 500.0000 mg | ORAL_TABLET | Freq: Two times a day (BID) | ORAL | 0 refills | Status: DC

## 2016-09-12 NOTE — ED Triage Notes (Signed)
Per pt, states chest pain for 3 days-states pain when she bends over and/or stands up-states  Occasional SOB

## 2016-09-12 NOTE — ED Provider Notes (Signed)
[facility]-EMERGENCY DEPT Provider Note   CSN: [id number] Arrival date & time: 09/12/16  1659     History   Chief Complaint Chief Complaint  Patient presents with  . Chest Pain    HPI [name] is a 25 y.o. female. She resists evaluation chest pain for the last 3 days. It hurts with movements are pending not with breathing. Does not feel short of breath. No pleuritic component. No fall no injury or trauma. No reflux symptoms. No fevers chills cough.  HPI  Past Medical History:  Diagnosis Date  . GERD (gastroesophageal reflux disease)    with pregnancy   . Headache   . Medical history non-contributory     Patient Active Problem List   Diagnosis Date Noted  . Pregnancy 01/18/2016  . 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 Complete miscarriage 08/20/2014    Past Surgical History:  Procedure Laterality Date  . ADENOIDECTOMY    . CESAREAN SECTION    . CESAREAN SECTION N/A 01/16/2016   Procedure: CESAREAN SECTION;  Surgeon: Richarda Overlieichard Holland, MD;  Location: [facility] ORS;  Service: Obstetrics;  Laterality: N/A;  Repeat edc 01/23/16 NKDA  . TONSILLECTOMY      OB History    Gravida Para Term Preterm AB Living   3 2 2   1 2    SAB TAB Ectopic Multiple Live Births     1   0 2       Home Medications    Prior to Admission medications   Medication Sig Start Date End Date Taking? Authorizing Provider  acetaminophen (TYLENOL) 500 MG tablet Take 1,000 mg by mouth every 6 (six) hours as needed for mild pain or headache.    Yes Historical Provider, MD  naproxen (NAPROSYN) 500 MG tablet Take 1 tablet (500 mg total) by mouth 2 (two) times daily. 09/12/16   Rolland PorterMark Arnice Vanepps, MD    Family History Family History  Problem Relation Age of Onset  . Hypertension Mother   . Diabetes Maternal Grandmother   . Heart disease Maternal Grandmother     Social History Social History  Substance Use Topics  . Smoking status: Current Every Day Smoker    Packs/day: 0.50    Types: Cigarettes  . Smokeless  tobacco: Never Used  . Alcohol use Yes     Comment: occasional     Allergies   Review of patient's allergies indicates no known allergies.   Review of Systems Review of Systems  Constitutional: Negative for appetite change, chills, diaphoresis, fatigue and fever.  HENT: Negative for mouth sores, sore throat and trouble swallowing.   Eyes: Negative for visual disturbance.  Respiratory: Negative for cough, chest tightness, shortness of breath and wheezing.   Cardiovascular: Positive for chest pain.  Gastrointestinal: Negative for abdominal distention, abdominal pain, diarrhea, nausea and vomiting.  Endocrine: Negative for polydipsia, polyphagia and polyuria.  Genitourinary: Negative for dysuria, frequency and hematuria.  Musculoskeletal: Negative for gait problem.  Skin: Negative for color change, pallor and rash.  Neurological: Negative for dizziness, syncope, light-headedness and headaches.  Hematological: Does not bruise/bleed easily.  Psychiatric/Behavioral: Negative for behavioral problems and confusion.     Physical Exam Updated Vital Signs BP 122/85 (BP Location: Right Arm)   Pulse 85   Temp 98.2 F (36.8 C) (Oral)   Resp 16   Wt 147 lb (66.7 kg)   LMP 08/29/2016   SpO2 100%   BMI 26.04 kg/m   Physical Exam  Constitutional: She is oriented to person, place, and time.  She appears well-developed and well-nourished. No distress.  HENT:  Head: Normocephalic.  Eyes: Conjunctivae are normal. Pupils are equal, round, and reactive to light. No scleral icterus.  Neck: Normal range of motion. Neck supple. No thyromegaly present.  Cardiovascular: Normal rate and regular rhythm.  Exam reveals no gallop and no friction rub.   No murmur heard. Pulmonary/Chest: Effort normal and breath sounds normal. No respiratory distress. She has no wheezes. She has no rales.    Abdominal: Soft. Bowel sounds are normal. She exhibits no distension. There is no tenderness. There is no  rebound.  Musculoskeletal: Normal range of motion.  Neurological: She is alert and oriented to person, place, and time.  Skin: Skin is warm and dry. No rash noted.  Psychiatric: She has a normal mood and affect. Her behavior is normal.     ED Treatments / Results  Labs (all labs ordered are listed, but only abnormal results are displayed) Labs Reviewed  BASIC METABOLIC PANEL - Abnormal; Notable for the following:       Result Value   Anion gap 4 (*)    All other components within normal limits  CBC - Abnormal; Notable for the following:    Hemoglobin 11.8 (*)    HCT 34.6 (*)    All other components within normal limits  I-STAT TROPOININ, ED    EKG  EKG Interpretation  Date/Time:  Wednesday September 12 2016 17:18:50 EDT Ventricular Rate:  83 PR Interval:    QRS Duration: 82 QT Interval:  373 QTC Calculation: 439 R Axis:   42 Text Interpretation:  Sinus rhythm Borderline short PR interval Borderline T abnormalities, anterior leads Baseline wander in lead(s) V3 V4 V5 V6 Confirmed by Fayrene Fearing  MD, Ramonia Mcclaran ([id number]) on 09/12/2016 7:12:48 PM       Radiology Dg Chest 2 View  Result Date: 09/12/2016 CLINICAL DATA:  Chest pain for 3 days radiating to right head and neck. Smoker. EXAM: CHEST  2 VIEW COMPARISON:  10/20/2015 FINDINGS: The heart size and mediastinal contours are within normal limits. Both lungs are clear. The visualized skeletal structures are unremarkable. IMPRESSION: Negative.  No active cardiopulmonary disease. Electronically Signed   By: Myles Rosenthal M.D.   On: 09/12/2016 17:33    Procedures Procedures (including critical care time)  Medications Ordered in ED Medications  ibuprofen (ADVIL,MOTRIN) tablet 800 mg (800 mg Oral Given 09/12/16 1944)     Initial Impression / Assessment and Plan / ED Course  I have reviewed the triage vital signs and the nursing notes.  Pertinent labs & imaging results that were available during my care of the patient were reviewed by me  and considered in my medical decision making (see chart for details).  Clinical Course    Normal studies including EKG and chest x-ray. She is not tachycardic or hypoxemic. Doubt PE. No sign of pneumonia. Doubt ACS. Likely chest wall and costochondritis.  Final Clinical Impressions(s) / ED Diagnoses   Final diagnoses:  Chest pain, unspecified type  Costochondritis    New Prescriptions Discharge Medication List as of 09/12/2016  7:38 PM       Rolland Porter, MD 09/12/16 2348

## 2016-09-12 NOTE — Discharge Instructions (Signed)
Ibuprofen as needed for pain,.   Avoid strenuous activities until symptoms improve.

## 2016-10-17 ENCOUNTER — Encounter (HOSPITAL_COMMUNITY): Payer: Self-pay | Admitting: *Deleted

## 2016-10-17 ENCOUNTER — Inpatient Hospital Stay (HOSPITAL_COMMUNITY)
Admission: AD | Admit: 2016-10-17 | Discharge: 2016-10-17 | Disposition: A | Discharge status: Home / Self Care | Payer: Managed Care, Other (non HMO) | Source: Ambulatory Visit | Attending: Family Medicine | Admitting: Family Medicine

## 2016-10-17 ENCOUNTER — Inpatient Hospital Stay (HOSPITAL_COMMUNITY): Payer: Self-pay

## 2016-10-17 DIAGNOSIS — O26891 Other specified pregnancy related conditions, first trimester: Secondary | ICD-10-CM | POA: Insufficient documentation

## 2016-10-17 DIAGNOSIS — R519 Headache, unspecified: Secondary | ICD-10-CM

## 2016-10-17 DIAGNOSIS — Z3A01 Less than 8 weeks gestation of pregnancy: Secondary | ICD-10-CM

## 2016-10-17 DIAGNOSIS — O99331 Smoking (tobacco) complicating pregnancy, first trimester: Secondary | ICD-10-CM | POA: Insufficient documentation

## 2016-10-17 DIAGNOSIS — O26899 Other specified pregnancy related conditions, unspecified trimester: Secondary | ICD-10-CM

## 2016-10-17 DIAGNOSIS — R109 Unspecified abdominal pain: Secondary | ICD-10-CM | POA: Diagnosis not present

## 2016-10-17 DIAGNOSIS — K219 Gastro-esophageal reflux disease without esophagitis: Secondary | ICD-10-CM | POA: Insufficient documentation

## 2016-10-17 DIAGNOSIS — R51 Headache: Secondary | ICD-10-CM | POA: Insufficient documentation

## 2016-10-17 DIAGNOSIS — O99611 Diseases of the digestive system complicating pregnancy, first trimester: Secondary | ICD-10-CM | POA: Insufficient documentation

## 2016-10-17 DIAGNOSIS — F17210 Nicotine dependence, cigarettes, uncomplicated: Secondary | ICD-10-CM | POA: Insufficient documentation

## 2016-10-17 LAB — URINALYSIS, ROUTINE W REFLEX MICROSCOPIC
BILIRUBIN URINE: NEGATIVE
Glucose, UA: NEGATIVE mg/dL
HGB URINE DIPSTICK: NEGATIVE
Ketones, ur: 20 mg/dL — AB
LEUKOCYTES UA: NEGATIVE
NITRITE: NEGATIVE
PH: 5 (ref 5.0–8.0)
Protein, ur: 30 mg/dL — AB
SPECIFIC GRAVITY, URINE: 1.027 (ref 1.005–1.030)

## 2016-10-17 LAB — CBC
HEMATOCRIT: 34.7 % — AB (ref 36.0–46.0)
Hemoglobin: 12 g/dL (ref 12.0–15.0)
MCH: 28 pg (ref 26.0–34.0)
MCHC: 34.6 g/dL (ref 30.0–36.0)
MCV: 80.9 fL (ref 78.0–100.0)
Platelets: 189 10*3/uL (ref 150–400)
RBC: 4.29 MIL/uL (ref 3.87–5.11)
RDW: 12.6 % (ref 11.5–15.5)
WBC: 4.7 10*3/uL (ref 4.0–10.5)

## 2016-10-17 LAB — ABO/RH: ABO/RH(D): O POS

## 2016-10-17 LAB — WET PREP, GENITAL
Clue Cells Wet Prep HPF POC: NONE SEEN
SPERM: NONE SEEN
TRICH WET PREP: NONE SEEN
YEAST WET PREP: NONE SEEN

## 2016-10-17 LAB — HCG, QUANTITATIVE, PREGNANCY: HCG, BETA CHAIN, QUANT, S: 38052 m[IU]/mL — AB (ref <5)

## 2016-10-17 LAB — POCT PREGNANCY, URINE: Preg Test, Ur: POSITIVE — AB

## 2016-10-17 MED ORDER — ACETAMINOPHEN 325 MG PO TABS
650.0000 mg | ORAL_TABLET | Freq: Once | ORAL | Status: AC
  Administered 2016-10-17: 650 mg via ORAL
  Filled 2016-10-17: qty 2

## 2016-10-17 MED ORDER — CYCLOBENZAPRINE HCL 5 MG PO TABS
5.0000 mg | ORAL_TABLET | Freq: Once | ORAL | Status: AC
  Administered 2016-10-17: 5 mg via ORAL
  Filled 2016-10-17: qty 1

## 2016-10-17 NOTE — MAU Note (Signed)
Been having really bad chest pain-started about a month ago; was seen at [facility]- given anti inflammatory medicine, describes it as a "sprain"- never took the medicine or even got it filled. nothing but headaches for 2 wks- constant everyday. Started having abd cramps for the past 3 days.  Make her feel like when she had a miscarriage before. So she thought she would come get checked out. has not been seen yet for pregnancy. Currently not cramping or having chest pain

## 2016-10-17 NOTE — MAU Provider Note (Signed)
Chief Complaint:  Cramping  SUBJECTIVE HPI: [name] is a 25 y.o. [phone] at [redacted]w[redacted]d who presents to Maternity Admissions reporting cramping, headaches, and chest pain.  Patient is here for cramping. Started 2 days ago, getting worse with pain. Denies VB or abnormal VD. Not currently taking prenatal vitamins. Has not done anything to resolve cramping. Concerned due to this cramping was similar to previous miscarriage pain. Denies F/C. Cramping is bilateral, equally but not at same time, no pain in the mid pelvic region.   Headache for a couple weeks, but improving. Taking tylenol and ibuprofen, BC powders. Has not taken any medication today. Patient gets migraines, was on medicine in high school. Getting worse since high school. Does not see any provider now for migraines. Very dull headache today.  Chest pain for over 1 month, has been seen in ED. Told it was musculoskeletal. Nothing is really helping, although has not taken any medications or filled Rx by ER. No h/o asthma. Pain feels like a "tightness". NOT PRESENT TODAY.    Past Medical History:  Diagnosis Date  . GERD (gastroesophageal reflux disease)    with pregnancy   . Headache    OB History  Gravida Para Term Preterm AB Living  4 2 2   1 2   SAB TAB Ectopic Multiple Live Births    1   0 2    # Outcome Date GA Lbr Len/2nd Weight Sex Delivery Anes PTL Lv  4 Current           3 Term 01/16/16 [redacted]w[redacted]d  6 lb 8.9 oz (2.975 kg) F CS-LVertical Spinal  LIV  2 TAB 2015          1 Term 07/11/09    M CS-LTranv Spinal  LIV     Past Surgical History:  Procedure Laterality Date  . ADENOIDECTOMY    . CESAREAN SECTION    . CESAREAN SECTION N/A 01/16/2016   Procedure: CESAREAN SECTION;  Surgeon: Richarda Overlie, MD;  Location: [facility];  Service: Obstetrics;  Laterality: N/A;  Repeat edc 01/23/16 NKDA  . TONSILLECTOMY     Social History   Social History  . Marital status: Single    Spouse name: N/A  . Number of children: N/A  .  Years of education: N/A   Occupational History  . Not on file.   Social History Main Topics  . Smoking status: Current Some Day Smoker    Packs/day: 0.50    Types: Cigarettes  . Smokeless tobacco: Never Used  . Alcohol use Yes     Comment: occasional  . Drug use: No  . Sexual activity: Yes    Birth control/ protection: None   Other Topics Concern  . Not on file   Social History Narrative  . No narrative on file   No current facility-administered medications on file prior to encounter.    Current Outpatient Prescriptions on File Prior to Encounter  Medication Sig Dispense Refill  . acetaminophen (TYLENOL) 500 MG tablet Take 1,000 mg by mouth every 6 (six) hours as needed for mild pain or headache.     . naproxen (NAPROSYN) 500 MG tablet Take 1 tablet (500 mg total) by mouth 2 (two) times daily. (Patient not taking: Reported on 10/17/2016) 30 tablet 0   No Known Allergies  I have reviewed the past Medical Hx, Surgical Hx, Social Hx, Allergies and Medications.   REVIEW OF SYSTEMS  A comprehensive ROS was negative except per HPI.  OBJECTIVE Patient Vitals for the past 24 hrs:  BP Temp Temp src Pulse Resp SpO2 Weight  10/17/16 0855 111/66 98.2 F (36.8 C) Oral 82 16 98 % 144 lb 4 oz (65.4 kg)    PHYSICAL EXAM Constitutional: Well-developed, well-nourished female in no acute distress.  Cardiovascular: normal rate, rhythm, no murmurs Respiratory: normal rate and effort. CTAB GI: Abd soft, non-tender on palpation, non-distended. Pos BS x 4 MS: Extremities nontender, no edema, normal ROM Neurologic: Alert and oriented x 4.  GU: Neg CVAT. SPECULUM EXAM: NEFG, physiologic discharge, no blood noted, cervix clean BIMANUAL: cervix closed; uterus normal size, no adnexal tenderness or masses. No CMT.  LAB RESULTS Results for orders placed or performed during the hospital encounter of 10/17/16 (from the past 24 hour(s))  Urinalysis, Routine w reflex microscopic     Status:  Abnormal   Collection Time: 10/17/16  9:06 AM  Result Value Ref Range   Color, Urine YELLOW YELLOW   APPearance HAZY (A) CLEAR   Specific Gravity, Urine 1.027 1.005 - 1.030   pH 5.0 5.0 - 8.0   Glucose, UA NEGATIVE NEGATIVE mg/dL   Hgb urine dipstick NEGATIVE NEGATIVE   Bilirubin Urine NEGATIVE NEGATIVE   Ketones, ur 20 (A) NEGATIVE mg/dL   Protein, ur 30 (A) NEGATIVE mg/dL   Nitrite NEGATIVE NEGATIVE   Leukocytes, UA NEGATIVE NEGATIVE   RBC / HPF 0-5 0 - 5 RBC/hpf   WBC, UA 0-5 0 - 5 WBC/hpf   Bacteria, UA RARE (A) NONE SEEN   Squamous Epithelial / LPF 0-5 (A) NONE SEEN   Mucous PRESENT   Pregnancy, urine POC     Status: Abnormal   Collection Time: 10/17/16  9:10 AM  Result Value Ref Range   Preg Test, Ur POSITIVE (A) NEGATIVE    IMAGING [place] Ob Comp Less 14 Wks  Result Date: 10/17/2016 CLINICAL DATA:  Abdominal pain affecting pregnancy EXAM: OBSTETRIC <14 WK US AND TRANSVAGINAL OB US TECHNIQUE: Both transabdominal and transvaginal ultrasound examinations were performed for complete evaluation of the gestation as well as the maternal uterus, adnexal regions, and pelvic cul-de-sac. Transvaginal technique was performed to assess early pregnancy. COMPARISON:  None. FINDINGS: Intrauterine gestational sac: Single Yolk sac:  Visualized Embryo:  Visualized Cardiac Activity: Visualized Heart Rate: 109  bpm MSD:   mm    w     d CRL:  3.1  mm   5 w   6 d                  US EDC: 06/13/2017 Subchorionic hemorrhage: Small subchorionic hemorrhage, 3.3 x 1.0 x 1.6 cm Maternal uterus/adnexae: No adnexal masses.  Trace free fluid. IMPRESSION: Five week 6 day intrauterine pregnancy. Fetal heart rate 109 beats per minute. This is somewhat low, but may be related to bradycardia of early pregnancy. This could be followed with repeat ultrasound in 2 weeks. Small subchorionic hemorrhage. Electronically Signed   By: Charlett NoseKevin  Dover M.D.   On: 10/17/2016 12:12   [place] Ob Transvaginal  Result Date:  10/17/2016 CLINICAL DATA:  Abdominal pain affecting pregnancy EXAM: OBSTETRIC <14 WK US AND TRANSVAGINAL OB US TECHNIQUE: Both transabdominal and transvaginal ultrasound examinations were performed for complete evaluation of the gestation as well as the maternal uterus, adnexal regions, and pelvic cul-de-sac. Transvaginal technique was performed to assess early pregnancy. COMPARISON:  None. FINDINGS: Intrauterine gestational sac: Single Yolk sac:  Visualized Embryo:  Visualized Cardiac Activity: Visualized Heart Rate: 109  bpm MSD:  mm    w     d CRL:  3.1  mm   5 w   6 d                  [place] EDC: 06/13/2017 Subchorionic hemorrhage: Small subchorionic hemorrhage, 3.3 x 1.0 x 1.6 cm Maternal uterus/adnexae: No adnexal masses.  Trace free fluid. IMPRESSION: Five week 6 day intrauterine pregnancy. Fetal heart rate 109 beats per minute. This is somewhat low, but may be related to bradycardia of early pregnancy. This could be followed with repeat ultrasound in 2 weeks. Small subchorionic hemorrhage. Electronically Signed   By: Charlett Nose M.D.   On: 10/17/2016 12:12    MAU COURSE Labs Wet Prep TVUS - dating by [place], [redacted]w[redacted]d, FHR 109, low side, follow up outpatient for repeat  SSE   MDM Plan of care reviewed with patient, including labs and tests ordered and medical treatment.   ASSESSMENT 1. Abdominal cramping affecting pregnancy   2. Abdominal pain affecting pregnancy   3. Nonintractable episodic headache, unspecified headache type     PLAN Patient left AMA before able to be discharged, she left after coming back from [place], no results have been reported yet. She left before getting results or discharge papers, told RN that she needed to pick up her children, but mentioned her pain and headache had resolved. Follow up with OB for initial visit and possible repeat US       Medication List    ASK your doctor about these medications   acetaminophen 500 MG tablet Commonly known as:  TYLENOL Take  1,000 mg by mouth every 6 (six) hours as needed for mild pain or headache.   naproxen 500 MG tablet Commonly known as:  NAPROSYN Take 1 tablet (500 mg total) by mouth 2 (two) times daily.        Jen Mow, DO OB Fellow 10/17/2016 10:04 AM

## 2016-10-17 NOTE — MAU Note (Signed)
Patient left prior to receiving results and D/C instrs. States she must pick up her child. Dr. Omer Jack notified.

## 2016-10-18 LAB — GC/CHLAMYDIA PROBE AMP (~~LOC~~) NOT AT ARMC
Chlamydia: POSITIVE — AB
Neisseria Gonorrhea: NEGATIVE

## 2016-10-19 ENCOUNTER — Telehealth (HOSPITAL_COMMUNITY): Payer: Self-pay | Admitting: *Deleted

## 2016-10-19 NOTE — Telephone Encounter (Signed)
Notified pt. Of positive results for Chlamydia on 10/23/16.  Called prescription for Azitth. 1 gam po x 1 dose. Instructed to avoid intercourse for two weeks, and alert partner. Notification sent to health dept.

## 2017-07-04 ENCOUNTER — Inpatient Hospital Stay (HOSPITAL_COMMUNITY)
Admission: AD | Admit: 2017-07-04 | Discharge: 2017-07-04 | Disposition: A | Discharge status: Home / Self Care | Payer: Medicaid Other | Source: Ambulatory Visit | Attending: Obstetrics & Gynecology | Admitting: Obstetrics & Gynecology

## 2017-07-04 ENCOUNTER — Encounter (HOSPITAL_COMMUNITY): Payer: Self-pay | Admitting: *Deleted

## 2017-07-04 ENCOUNTER — Inpatient Hospital Stay (HOSPITAL_COMMUNITY): Payer: Medicaid Other

## 2017-07-04 DIAGNOSIS — R109 Unspecified abdominal pain: Secondary | ICD-10-CM | POA: Insufficient documentation

## 2017-07-04 DIAGNOSIS — O26891 Other specified pregnancy related conditions, first trimester: Secondary | ICD-10-CM

## 2017-07-04 DIAGNOSIS — O9A211 Injury, poisoning and certain other consequences of external causes complicating pregnancy, first trimester: Secondary | ICD-10-CM | POA: Diagnosis not present

## 2017-07-04 DIAGNOSIS — Z9889 Other specified postprocedural states: Secondary | ICD-10-CM | POA: Insufficient documentation

## 2017-07-04 DIAGNOSIS — K219 Gastro-esophageal reflux disease without esophagitis: Secondary | ICD-10-CM | POA: Insufficient documentation

## 2017-07-04 DIAGNOSIS — Z3A01 Less than 8 weeks gestation of pregnancy: Secondary | ICD-10-CM | POA: Insufficient documentation

## 2017-07-04 DIAGNOSIS — O99611 Diseases of the digestive system complicating pregnancy, first trimester: Secondary | ICD-10-CM | POA: Diagnosis not present

## 2017-07-04 DIAGNOSIS — O99331 Smoking (tobacco) complicating pregnancy, first trimester: Secondary | ICD-10-CM | POA: Diagnosis not present

## 2017-07-04 DIAGNOSIS — X58XXXA Exposure to other specified factors, initial encounter: Secondary | ICD-10-CM | POA: Insufficient documentation

## 2017-07-04 DIAGNOSIS — F17210 Nicotine dependence, cigarettes, uncomplicated: Secondary | ICD-10-CM | POA: Insufficient documentation

## 2017-07-04 DIAGNOSIS — Z3201 Encounter for pregnancy test, result positive: Secondary | ICD-10-CM | POA: Diagnosis not present

## 2017-07-04 DIAGNOSIS — Z3491 Encounter for supervision of normal pregnancy, unspecified, first trimester: Secondary | ICD-10-CM

## 2017-07-04 LAB — CBC
HCT: 32.2 % — ABNORMAL LOW (ref 36.0–46.0)
Hemoglobin: 11.1 g/dL — ABNORMAL LOW (ref 12.0–15.0)
MCH: 28.5 pg (ref 26.0–34.0)
MCHC: 34.5 g/dL (ref 30.0–36.0)
MCV: 82.6 fL (ref 78.0–100.0)
Platelets: 263 10*3/uL (ref 150–400)
RBC: 3.9 MIL/uL (ref 3.87–5.11)
RDW: 12.8 % (ref 11.5–15.5)
WBC: 9.1 10*3/uL (ref 4.0–10.5)

## 2017-07-04 LAB — URINALYSIS, ROUTINE W REFLEX MICROSCOPIC
BILIRUBIN URINE: NEGATIVE
Glucose, UA: NEGATIVE mg/dL
Hgb urine dipstick: NEGATIVE
KETONES UR: NEGATIVE mg/dL
Leukocytes, UA: NEGATIVE
NITRITE: NEGATIVE
Protein, ur: NEGATIVE mg/dL
Specific Gravity, Urine: 1.027 (ref 1.005–1.030)
pH: 6 (ref 5.0–8.0)

## 2017-07-04 LAB — HCG, QUANTITATIVE, PREGNANCY: HCG, BETA CHAIN, QUANT, S: 170696 m[IU]/mL — AB (ref <5)

## 2017-07-04 LAB — POCT PREGNANCY, URINE: PREG TEST UR: POSITIVE — AB

## 2017-07-04 NOTE — MAU Note (Signed)
Has an 68 mo old States her daughter fell onto her stomach "hard' Ever since then having abdominal cramping Rating 7/10--worse at night  LMP 6/30  +HPT  Denies vaginal bleeding or discharge

## 2017-07-04 NOTE — MAU Provider Note (Signed)
Chief Complaint: Abdominal Pain and Possible Pregnancy   First Provider Initiated Contact with Patient 07/04/17 1918     SUBJECTIVE HPI: [name] is a 26 y.o. [id number] at [redacted]w[redacted]d by LMP who presents to Maternity Admissions reporting: Abdominal pain after her daughter fell on abd yesterday.  Vaginal Bleeding: denies Passage of tissue or clots: denies Dizziness: denies  O POS  Pain Location: Low abd Quality: cramping Severity: 7/10 on pain scale Duration: since yesterday Course: worsened, then improved Context: early pregnancy post mild abd trauma. IUP not confirmed Timing: constant Modifying factors: None. Hasn't tried anything for the pain.  Associated signs and symptoms: Neg  Past Medical History:  Diagnosis Date  . GERD (gastroesophageal reflux disease)    with pregnancy   . Headache    OB History  Gravida Para Term Preterm AB Living  5 2 2   1 2   SAB TAB Ectopic Multiple Live Births    1   0 2    # Outcome Date GA Lbr Len/2nd Weight Sex Delivery Anes PTL Lv  5 Current           4 Term 01/16/16 [redacted]w[redacted]d  6 lb 8.9 oz (2.975 kg) F CS-LVertical Spinal  LIV  3 TAB 2015          2 Term 07/11/09    M CS-LTranv Spinal  LIV  1 Gravida              Past Surgical History:  Procedure Laterality Date  . ADENOIDECTOMY    . CESAREAN SECTION    . CESAREAN SECTION N/A 01/16/2016   Procedure: CESAREAN SECTION;  Surgeon: Richarda Overlie, MD;  Location: [facility];  Service: Obstetrics;  Laterality: N/A;  Repeat edc 01/23/16 NKDA  . TONSILLECTOMY     Social History   Social History  . Marital status: Single    Spouse name: N/A  . Number of children: N/A  . Years of education: N/A   Occupational History  . Not on file.   Social History Main Topics  . Smoking status: Current Some Day Smoker    Packs/day: 0.25    Types: Cigarettes  . Smokeless tobacco: Never Used  . Alcohol use Yes     Comment: occasional  . Drug use: No  . Sexual activity: Yes    Birth control/  protection: None   Other Topics Concern  . Not on file   Social History Narrative  . No narrative on file   No current facility-administered medications on file prior to encounter.    Current Outpatient Prescriptions on File Prior to Encounter  Medication Sig Dispense Refill  . acetaminophen (TYLENOL) 500 MG tablet Take 1,000 mg by mouth every 6 (six) hours as needed for mild pain or headache.     . naproxen (NAPROSYN) 500 MG tablet Take 1 tablet (500 mg total) by mouth 2 (two) times daily. (Patient not taking: Reported on 10/17/2016) 30 tablet 0   No Known Allergies  I have reviewed the past Medical Hx, Surgical Hx, Social Hx, Allergies and Medications.   Review of Systems  Constitutional: Negative for chills and fever.  Gastrointestinal: Positive for abdominal pain. Negative for abdominal distention, constipation, diarrhea, nausea and vomiting.  Genitourinary: Negative for dysuria, flank pain, vaginal bleeding and vaginal discharge.  Musculoskeletal: Negative for back pain.    OBJECTIVE Patient Vitals for the past 24 hrs:  BP Temp Temp src Pulse Resp SpO2 Weight  07/04/17 1822 122/62 98.1 F (36.7 C)  Oral 87 16 99 % 176 lb 0.6 oz (79.9 kg)   Constitutional: Well-developed, well-nourished female in no acute distress.  Cardiovascular: normal rate Respiratory: normal rate and effort.  GI: Abd soft, non-tender. Pos BS x 4 MS: Extremities nontender, no edema, normal ROM Neurologic: Alert and oriented x 4.  GU: Neg CVAT.  SPECULUM EXAM: NEFG, physiologic discharge, no blood noted, cervix clean  BIMANUAL: cervix closed; uterus top-normal size, no adnexal tenderness or masses. No CMT.  LAB RESULTS Results for orders placed or performed during the hospital encounter of 07/04/17 (from the past 24 hour(s))  Urinalysis, Routine w reflex microscopic     Status: Abnormal   Collection Time: 07/04/17  6:23 PM  Result Value Ref Range   Color, Urine YELLOW YELLOW   APPearance HAZY (A)  CLEAR   Specific Gravity, Urine 1.027 1.005 - 1.030   pH 6.0 5.0 - 8.0   Glucose, UA NEGATIVE NEGATIVE mg/dL   Hgb urine dipstick NEGATIVE NEGATIVE   Bilirubin Urine NEGATIVE NEGATIVE   Ketones, ur NEGATIVE NEGATIVE mg/dL   Protein, ur NEGATIVE NEGATIVE mg/dL   Nitrite NEGATIVE NEGATIVE   Leukocytes, UA NEGATIVE NEGATIVE  Pregnancy, urine POC     Status: Abnormal   Collection Time: 07/04/17  6:48 PM  Result Value Ref Range   Preg Test, Ur POSITIVE (A) NEGATIVE  hCG, quantitative, pregnancy     Status: Abnormal   Collection Time: 07/04/17  7:11 PM  Result Value Ref Range   hCG, Beta Chain, Quant, S 170,696 (H) <5 mIU/mL  CBC     Status: Abnormal   Collection Time: 07/04/17  7:11 PM  Result Value Ref Range   WBC 9.1 4.0 - 10.5 K/uL   RBC 3.90 3.87 - 5.11 MIL/uL   Hemoglobin 11.1 (L) 12.0 - 15.0 g/dL   HCT 65.7 (L) 84.6 - 96.2 %   MCV 82.6 78.0 - 100.0 fL   MCH 28.5 26.0 - 34.0 pg   MCHC 34.5 30.0 - 36.0 g/dL   RDW 95.2 84.1 - 32.4 %   Platelets 263 150 - 400 K/uL    IMAGING US Ob Comp Less 14 Wks  Result Date: 07/04/2017 CLINICAL DATA:  Cramping with positive pregnancy test. EXAM: OBSTETRIC <14 WK [place] AND TRANSVAGINAL OB US TECHNIQUE: Both transabdominal and transvaginal ultrasound examinations were performed for complete evaluation of the gestation as well as the maternal uterus, adnexal regions, and pelvic cul-de-sac. Transvaginal technique was performed to assess early pregnancy. COMPARISON:  None. FINDINGS: Intrauterine gestational sac: Single. Yolk sac:  Visualized. Embryo:  Visualized. Cardiac Activity: Visualized. Heart Rate: 138  bpm CRL:  Eight  mm   6 w   5 d                  [place] EDC: 02/22/2018 Subchorionic hemorrhage:  Tiny Maternal uterus/adnexae: Maternal ovaries unremarkable. No evidence for intraperitoneal free fluid. IMPRESSION: Single living intrauterine gestation at estimated 6 week 5 day gestational age by crown-rump length. Electronically Signed   By: Kennith Center  M.D.   On: 07/04/2017 19:43   US Ob Transvaginal  Result Date: 07/04/2017 CLINICAL DATA:  Cramping with positive pregnancy test. EXAM: OBSTETRIC <14 WK [place] AND TRANSVAGINAL OB US TECHNIQUE: Both transabdominal and transvaginal ultrasound examinations were performed for complete evaluation of the gestation as well as the maternal uterus, adnexal regions, and pelvic cul-de-sac. Transvaginal technique was performed to assess early pregnancy. COMPARISON:  None. FINDINGS: Intrauterine gestational sac: Single. Yolk sac:  Visualized. Embryo:  Visualized. Cardiac Activity: Visualized. Heart Rate: 138  bpm CRL:  Eight  mm   6 w   5 d                  [place] EDC: 02/22/2018 Subchorionic hemorrhage:  Tiny Maternal uterus/adnexae: Maternal ovaries unremarkable. No evidence for intraperitoneal free fluid. IMPRESSION: Single living intrauterine gestation at estimated 6 week 5 day gestational age by crown-rump length. Electronically Signed   By: Kennith Center M.D.   On: 07/04/2017 19:43    MAU COURSE CBC, Quant, ABO/Rh, ultrasound, wet prep and GC/chlamydia culture, UA  MDM Pain in early pregnancy after mild trauma with normal intrauterine pregnancy and hemodynamically stable.  ASSESSMENT 1. Normal IUP (intrauterine pregnancy) on prenatal ultrasound, first trimester   2. Traumatic injury during pregnancy, antepartum, first trimester   3. Abdominal pain during pregnancy in first trimester     PLAN Discharge home in stable condition. First trimester precautions Follow-up Information    obstetrician of your choice Follow up.   Why:  start prenatal care       [facility] [place] MATERNITY ADMISSIONS Follow up.   Why:  in pregnancy emergencies Contact information: [address] [id number] mc [place] [address] [phone]         Allergies as of 07/04/2017   No Known Allergies     Medication List    STOP taking these medications   naproxen 500 MG  tablet Commonly known as:  NAPROSYN     TAKE these medications   acetaminophen 500 MG tablet Commonly known as:  TYLENOL Take 1,000 mg by mouth every 6 (six) hours as needed for mild pain or headache.            Discharge Care Instructions        Start     Ordered   07/04/17 0000  Discharge patient    Question Answer Comment  Discharge disposition 01-Home or Self Care   Discharge patient date 07/04/2017      07/04/17 2040       Katrinka Blazing [place] 07/04/2017  8:41 PM  4

## 2017-07-04 NOTE — Discharge Instructions (Signed)
Abdominal Pain During Pregnancy Abdominal pain is common in pregnancy. Most of the time, it does not cause harm. There are many causes of abdominal pain. Some causes are more serious than others and sometimes the cause is not known. Abdominal pain can be a sign that something is very wrong with the pregnancy or the pain may have nothing to do with the pregnancy. Always tell your health care provider if you have any abdominal pain. Follow these instructions at home:  Do not have sex or put anything in your vagina until your symptoms go away completely.  Watch your abdominal pain for any changes.  Get plenty of rest until your pain improves.  Drink enough fluid to keep your urine clear or pale yellow.  Take over-the-counter or prescription medicines only as told by your health care provider.  Keep all follow-up visits as told by your health care provider. This is important. Contact a health care provider if:  You have a fever.  Your pain gets worse or you have cramping.  Your pain continues after resting. Get help right away if:  You are bleeding, leaking fluid, or passing tissue from the vagina.  You have vomiting or diarrhea that does not go away.  You have painful or bloody urination.  You notice a decrease in your baby's movements.  You feel very weak or faint.  You have shortness of breath.  You develop a severe headache with abdominal pain.  You have abnormal vaginal discharge with abdominal pain. This information is not intended to replace advice given to you by your health care provider. Make sure you discuss any questions you have with your health care provider. Document Released: 10/29/2005 Document Revised: 08/09/2016 Document Reviewed: 05/28/2013 Elsevier Interactive Patient Education  2018 [employer].   Prenatal Care WHAT IS PRENATAL CARE? Prenatal care is the process of caring for a pregnant woman before she gives birth. Prenatal care makes sure that she  and her baby remain as healthy as possible throughout pregnancy. Prenatal care may be provided by a midwife, family practice health care provider, or a childbirth and pregnancy specialist (obstetrician). Prenatal care may include physical examinations, testing, treatments, and education on nutrition, lifestyle, and social support services. WHY IS PRENATAL CARE SO IMPORTANT? Early and consistent prenatal care increases the chance that you and your baby will remain healthy throughout your pregnancy. This type of care also decreases a babys risk of being born too early (prematurely), or being born smaller than expected (small for gestational age). Any underlying medical conditions you may have that could pose a risk during your pregnancy are discussed during prenatal care visits. You will also be monitored regularly for any new conditions that may arise during your pregnancy so they can be treated quickly and effectively. WHAT HAPPENS DURING PRENATAL CARE VISITS? Prenatal care visits may include the following: Discussion Tell your health care provider about any new signs or symptoms you have experienced since your last visit. These might include:  Nausea or vomiting.  Increased or decreased level of energy.  Difficulty sleeping.  Back or leg pain.  Weight changes.  Frequent urination.  Shortness of breath with physical activity.  Changes in your skin, such as the development of a rash or itchiness.  Vaginal discharge or bleeding.  Feelings of excitement or nervousness.  Changes in your babys movements.  You may want to write down any questions or topics you want to discuss with your health care provider and bring them with you to  your appointment. Examination During your first prenatal care visit, you will likely have a complete physical exam. Your health care provider will often examine your vagina, cervix, and the position of your uterus, as well as check your heart, lungs, and other  body systems. As your pregnancy progresses, your health care provider will measure the size of your uterus and your babys position inside your uterus. He or she may also examine you for early signs of labor. Your prenatal visits may also include checking your blood pressure and, after about 10-12 weeks of pregnancy, listening to your babys heartbeat. Testing Regular testing often includes:  Urinalysis. This checks your urine for glucose, protein, or signs of infection.  Blood count. This checks the levels of white and red blood cells in your body.  Tests for sexually transmitted infections (STIs). Testing for STIs at the beginning of pregnancy is routinely done and is required in many states.  Antibody testing. You will be checked to see if you are immune to certain illnesses, such as rubella, that can affect a developing fetus.  Glucose screen. Around 24-28 weeks of pregnancy, your blood glucose level will be checked for signs of gestational diabetes. Follow-up tests may be recommended.  Group B strep. This is a bacteria that is commonly found inside a womans vagina. This test will inform your health care provider if you need an antibiotic to reduce the amount of this bacteria in your body prior to labor and childbirth.  Ultrasound. Many pregnant women undergo an ultrasound screening around 18-20 weeks of pregnancy to evaluate the health of the fetus and check for any developmental abnormalities.  HIV (human immunodeficiency virus) testing. Early in your pregnancy, you will be screened for HIV. If you are at high risk for HIV, this test may be repeated during your third trimester of pregnancy.  You may be offered other testing based on your age, personal or family medical history, or other factors. HOW OFTEN SHOULD I PLAN TO SEE MY HEALTH CARE PROVIDER FOR PRENATAL CARE? Your prenatal care check-up schedule depends on any medical conditions you have before, or develop during, your  pregnancy. If you do not have any underlying medical conditions, you will likely be seen for checkups:  Monthly, during the first 6 months of pregnancy.  Twice a month during months 7 and 8 of pregnancy.  Weekly starting in the 9th month of pregnancy and until delivery.  If you develop signs of early labor or other concerning signs or symptoms, you may need to see your health care provider more often. Ask your health care provider what prenatal care schedule is best for you. WHAT CAN I DO TO KEEP MYSELF AND MY BABY AS HEALTHY AS POSSIBLE DURING MY PREGNANCY?  Take a prenatal vitamin containing 400 micrograms (0.4 mg) of folic acid every day. Your health care provider may also ask you to take additional vitamins such as iodine, vitamin D, iron, copper, and zinc.  Take 1500-2000 mg of calcium daily starting at your 20th week of pregnancy until you deliver your baby.  Make sure you are up to date on your vaccinations. Unless directed otherwise by your health care provider: ? You should receive a tetanus, diphtheria, and pertussis (Tdap) vaccination between the 27th and 36th week of your pregnancy, regardless of when your last Tdap immunization occurred. This helps protect your baby from whooping cough (pertussis) after he or she is born. ? You should receive an annual inactivated influenza vaccine (IIV) to help protect  you and your baby from influenza. This can be done at any point during your pregnancy.  Eat a well-rounded diet that includes: ? Fresh fruits and vegetables. ? Lean proteins. ? Calcium-rich foods such as milk, yogurt, hard cheeses, and dark, leafy greens. ? Whole grain breads.  Do noteat seafood high in mercury, including: ? Swordfish. ? Tilefish. ? Shark. ? King mackerel. ? More than 6 oz tuna per week.  Do not eat: ? Raw or undercooked meats or eggs. ? Unpasteurized foods, such as soft cheeses (brie, blue, or feta), juices, and milks. ? Lunch meats. ? Hot dogs that  have not been heated until they are steaming.  Drink enough water to keep your urine clear or pale yellow. For many women, this may be 10 or more 8 oz glasses of water each day. Keeping yourself hydrated helps deliver nutrients to your baby and may prevent the start of pre-term uterine contractions.  Do not use any tobacco products including cigarettes, chewing tobacco, or electronic cigarettes. If you need help quitting, ask your health care provider.  Do not drink beverages containing alcohol. No safe level of alcohol consumption during pregnancy has been determined.  Do not use any illegal drugs. These can harm your developing baby or cause a miscarriage.  Ask your health care provider or pharmacist before taking any prescription or over-the-counter medicines, herbs, or supplements.  Limit your caffeine intake to no more than 200 mg per day.  Exercise. Unless told otherwise by your health care provider, try to get 30 minutes of moderate exercise most days of the week. Do not  do high-impact activities, contact sports, or activities with a high risk of falling, such as horseback riding or downhill skiing.  Get plenty of rest.  Avoid anything that raises your body temperature, such as hot tubs and saunas.  If you own a cat, do not empty its litter box. Bacteria contained in cat feces can cause an infection called toxoplasmosis. This can result in serious harm to the fetus.  Stay away from chemicals such as insecticides, lead, mercury, and cleaning or paint products that contain solvents.  Do not have any X-rays taken unless medically necessary.  Take a childbirth and breastfeeding preparation class. Ask your health care provider if you need a referral or recommendation.  This information is not intended to replace advice given to you by your health care provider. Make sure you discuss any questions you have with your health care provider. Document Released: 11/01/2003 Document Revised:  04/02/2016 Document Reviewed: 01/13/2014 Elsevier Interactive Patient Education  2017 [employer]

## 2017-07-05 LAB — HIV ANTIBODY (ROUTINE TESTING W REFLEX): HIV Screen 4th Generation wRfx: NONREACTIVE

## 2017-07-10 ENCOUNTER — Inpatient Hospital Stay (HOSPITAL_COMMUNITY): Payer: Medicaid Other

## 2017-07-10 ENCOUNTER — Inpatient Hospital Stay (HOSPITAL_COMMUNITY)
Admission: AD | Admit: 2017-07-10 | Discharge: 2017-07-10 | Disposition: A | Discharge status: Home / Self Care | Payer: Medicaid Other | Source: Ambulatory Visit | Attending: Obstetrics and Gynecology | Admitting: Obstetrics and Gynecology

## 2017-07-10 ENCOUNTER — Encounter (HOSPITAL_COMMUNITY): Payer: Self-pay | Admitting: *Deleted

## 2017-07-10 DIAGNOSIS — Z8249 Family history of ischemic heart disease and other diseases of the circulatory system: Secondary | ICD-10-CM | POA: Diagnosis not present

## 2017-07-10 DIAGNOSIS — O99611 Diseases of the digestive system complicating pregnancy, first trimester: Secondary | ICD-10-CM | POA: Insufficient documentation

## 2017-07-10 DIAGNOSIS — Z3491 Encounter for supervision of normal pregnancy, unspecified, first trimester: Secondary | ICD-10-CM

## 2017-07-10 DIAGNOSIS — Z833 Family history of diabetes mellitus: Secondary | ICD-10-CM | POA: Insufficient documentation

## 2017-07-10 DIAGNOSIS — Z9889 Other specified postprocedural states: Secondary | ICD-10-CM | POA: Insufficient documentation

## 2017-07-10 DIAGNOSIS — O26891 Other specified pregnancy related conditions, first trimester: Secondary | ICD-10-CM | POA: Insufficient documentation

## 2017-07-10 DIAGNOSIS — Z79899 Other long term (current) drug therapy: Secondary | ICD-10-CM | POA: Diagnosis not present

## 2017-07-10 DIAGNOSIS — R1030 Lower abdominal pain, unspecified: Secondary | ICD-10-CM | POA: Diagnosis not present

## 2017-07-10 DIAGNOSIS — F17210 Nicotine dependence, cigarettes, uncomplicated: Secondary | ICD-10-CM | POA: Diagnosis not present

## 2017-07-10 DIAGNOSIS — O209 Hemorrhage in early pregnancy, unspecified: Secondary | ICD-10-CM

## 2017-07-10 DIAGNOSIS — Z3A08 8 weeks gestation of pregnancy: Secondary | ICD-10-CM | POA: Diagnosis not present

## 2017-07-10 DIAGNOSIS — O4690 Antepartum hemorrhage, unspecified, unspecified trimester: Secondary | ICD-10-CM | POA: Diagnosis present

## 2017-07-10 DIAGNOSIS — O26899 Other specified pregnancy related conditions, unspecified trimester: Secondary | ICD-10-CM

## 2017-07-10 DIAGNOSIS — K219 Gastro-esophageal reflux disease without esophagitis: Secondary | ICD-10-CM | POA: Diagnosis not present

## 2017-07-10 DIAGNOSIS — R109 Unspecified abdominal pain: Secondary | ICD-10-CM

## 2017-07-10 NOTE — MAU Note (Signed)
Pt started cramping around 1900 and said it has gotten worse.  She noticed bright red bleeding with one small clot around 2050,

## 2017-07-10 NOTE — MAU Note (Signed)
Urine in the lab  

## 2017-07-10 NOTE — Discharge Instructions (Signed)
Threatened Miscarriage °A threatened miscarriage occurs when you have vaginal bleeding during your first 20 weeks of pregnancy but the pregnancy has not ended. If you have vaginal bleeding during this time, your health care provider will do tests to make sure you are still pregnant. If the tests show you are still pregnant and the developing baby (fetus) inside your womb (uterus) is still growing, your condition is considered a threatened miscarriage. °A threatened miscarriage does not mean your pregnancy will end, but it does increase the risk of losing your pregnancy (complete miscarriage). °What are the causes? °The cause of a threatened miscarriage is usually not known. If you go on to have a complete miscarriage, the most common cause is an abnormal number of chromosomes in the developing baby. Chromosomes are the structures inside cells that hold all your genetic material. °Some causes of vaginal bleeding that do not result in miscarriage include: °· Having sex. °· Having an infection. °· Normal hormone changes of pregnancy. °· Bleeding that occurs when an egg implants in your uterus. ° °What increases the risk? °Risk factors for bleeding in early pregnancy include: °· Obesity. °· Smoking. °· Drinking excessive amounts of alcohol or caffeine. °· Recreational drug use. ° °What are the signs or symptoms? °· Light vaginal bleeding. °· Mild abdominal pain or cramps. °How is this diagnosed? °If you have bleeding with or without abdominal pain before 20 weeks of pregnancy, your health care provider will do tests to check whether you are still pregnant. One important test involves using sound waves and a computer (ultrasound) to create images of the inside of your uterus. Other tests include an internal exam of your vagina and uterus (pelvic exam) and measurement of your baby’s heart rate. °You may be diagnosed with a threatened miscarriage if: °· Ultrasound testing shows you are still pregnant. °· Your baby’s heart  rate is strong. °· A pelvic exam shows that the opening between your uterus and your vagina (cervix) is closed. °· Your heart rate and blood pressure are stable. °· Blood tests confirm you are still pregnant. ° °How is this treated? °No treatments have been shown to prevent a threatened miscarriage from going on to a complete miscarriage. However, the right home care is important. °Follow these instructions at home: °· Make sure you keep all your appointments for prenatal care. This is very important. °· Get plenty of rest. °· Do not have sex or use tampons if you have vaginal bleeding. °· Do not douche. °· Do not smoke or use recreational drugs. °· Do not drink alcohol. °· Avoid caffeine. °Contact a health care provider if: °· You have light vaginal bleeding or spotting while pregnant. °· You have abdominal pain or cramping. °· You have a fever. °Get help right away if: °· You have heavy vaginal bleeding. °· You have blood clots coming from your vagina. °· You have severe low back pain or abdominal cramps. °· You have fever, chills, and severe abdominal pain. °This information is not intended to replace advice given to you by your health care provider. Make sure you discuss any questions you have with your health care provider. °Document Released: 10/29/2005 Document Revised: 04/05/2016 Document Reviewed: 08/25/2013 °Elsevier Interactive Patient Education © 2018 Elsevier Inc. ° °

## 2017-07-10 NOTE — MAU Provider Note (Signed)
History     CSN: [id number]  Arrival date and time: 07/10/17 2049  First Provider Initiated Contact with Patient 07/10/17 2135      Chief Complaint  Patient presents with  . Vaginal Bleeding   HPI [name] is a 26 y.o. [id number] at [redacted]w[redacted]d who presents with vaginal bleeding. Patient in MAU last week & had IUP on ultrasound with cardiac activity & "tiny" [facility].  Patient reports episode of vaginal bleeding this evening around 7 pm associated with menstrual like abdominal cramps. Passed 1 small blood clot but did not bleed into her underwear. Bleeding described as dark red spotting on toilet paper. Lower abdominal cramping is intermittent. Rates as 8/10. Has not treated. Denies n/v/d, constipation, vaginal discharge, or dysuria. Had intercourse yesterday.   OB History    Gravida Para Term Preterm AB Living   4 2 2   1 2    SAB TAB Ectopic Multiple Live Births     1   0 2      Past Medical History:  Diagnosis Date  . GERD (gastroesophageal reflux disease)    with pregnancy   . Headache     Past Surgical History:  Procedure Laterality Date  . ADENOIDECTOMY    . CESAREAN SECTION    . CESAREAN SECTION N/A 01/16/2016   Procedure: CESAREAN SECTION;  Surgeon: Richarda Overlie, MD;  Location: [facility];  Service: Obstetrics;  Laterality: N/A;  Repeat edc 01/23/16 NKDA  . TONSILLECTOMY      Family History  Problem Relation Age of Onset  . Hypertension Mother   . Diabetes Maternal Grandmother   . Heart disease Maternal Grandmother     Social History  Substance Use Topics  . Smoking status: Current Every Day Smoker    Packs/day: 0.25    Types: Cigarettes  . Smokeless tobacco: Never Used  . Alcohol use Yes     Comment: occasional    Allergies: No Known Allergies  Prescriptions Prior to Admission  Medication Sig Dispense Refill Last Dose  . acetaminophen (TYLENOL) 500 MG tablet Take 1,000 mg by mouth every 6 (six) hours as needed for mild pain or headache.    Past Month at  Unknown time    Review of Systems  Constitutional: Negative.   Gastrointestinal: Positive for abdominal pain. Negative for constipation, diarrhea, nausea and vomiting.  Genitourinary: Positive for vaginal bleeding. Negative for dysuria and vaginal discharge.   Physical Exam   Blood pressure 126/68, pulse 96, temperature 98.7 F (37.1 C), temperature source Oral, resp. rate 16, last menstrual period 05/11/2017, SpO2 100 %, unknown if currently breastfeeding.  Physical Exam  Nursing note and vitals reviewed. Constitutional: She is oriented to person, place, and time. She appears well-developed and well-nourished. No distress.  HENT:  Head: Normocephalic and atraumatic.  Eyes: Conjunctivae are normal. Right eye exhibits no discharge. Left eye exhibits no discharge. No scleral icterus.  Neck: Normal range of motion.  Respiratory: Effort normal. No respiratory distress.  GI: Soft. There is no tenderness.  Genitourinary: Cervix exhibits no friability. There is bleeding (small amount of dark red mucoid blood, no active bleeding; cervix closed/thick) in the vagina.  Neurological: She is alert and oriented to person, place, and time.  Skin: Skin is warm and dry. She is not diaphoretic.  Psychiatric: She has a normal mood and affect. Her behavior is normal. Judgment and thought content normal.    MAU Course  Procedures No results found for this or any previous visit (from the  past 24 hour(s)). US Ob Transvaginal  Result Date: 07/10/2017 CLINICAL DATA:  Bleeding and abdominal pain. EXAM: TRANSVAGINAL OB ULTRASOUND TECHNIQUE: Transvaginal ultrasound was performed for complete evaluation of the gestation as well as the maternal uterus, adnexal regions, and pelvic cul-de-sac. COMPARISON:  07/04/2017 FINDINGS: Intrauterine gestational sac: Single Yolk sac:  Visible Embryo:  Visible Cardiac Activity: Visible Heart Rate: 161 bpm MSD:   mm    w     d CRL:   16.3  mm   8 w 0 d                  [place] EDC:  02/19/2018 Subchorionic hemorrhage:  None visualized. Maternal uterus/adnexae: Normal ovaries. No abnormal fluid collections IMPRESSION: Single living intrauterine gestation measuring 8 weeks 0 days by crown-rump length. Electronically Signed   By: Ellery Plunk M.D.   On: 07/10/2017 22:08    MDM O positive Small amount of blood on exam. Cervix closed. Repeated ultrasound tonight shows IUP with cardiac activity, SCH no longer present.   Assessment and Plan  A: 1. Normal IUP (intrauterine pregnancy) on prenatal ultrasound, first trimester   2. Vaginal bleeding in pregnancy, first trimester   3. Abdominal pain affecting pregnancy    P: Discharge home Pelvic rest Discussed reasons to return to MAU Start prenatal care  [name] 07/10/2017, 9:35 PM

## 2017-08-01 LAB — OB RESULTS CONSOLE HEPATITIS B SURFACE ANTIGEN: HEP B S AG: NEGATIVE

## 2017-08-01 LAB — OB RESULTS CONSOLE GC/CHLAMYDIA
CHLAMYDIA, DNA PROBE: NEGATIVE
GC PROBE AMP, GENITAL: NEGATIVE

## 2017-08-01 LAB — OB RESULTS CONSOLE RUBELLA ANTIBODY, IGM: Rubella: IMMUNE

## 2017-08-01 LAB — OB RESULTS CONSOLE HIV ANTIBODY (ROUTINE TESTING): HIV: NONREACTIVE

## 2017-12-11 ENCOUNTER — Other Ambulatory Visit: Payer: Self-pay | Admitting: Obstetrics and Gynecology

## 2017-12-20 ENCOUNTER — Inpatient Hospital Stay (HOSPITAL_COMMUNITY)
Admission: AD | Admit: 2017-12-20 | Discharge: 2017-12-20 | Disposition: A | Discharge status: Home / Self Care | Payer: Medicaid Other | Source: Ambulatory Visit | Attending: Obstetrics & Gynecology | Admitting: Obstetrics & Gynecology

## 2017-12-20 ENCOUNTER — Other Ambulatory Visit: Payer: Self-pay

## 2017-12-20 ENCOUNTER — Encounter (HOSPITAL_COMMUNITY): Payer: Self-pay | Admitting: *Deleted

## 2017-12-20 DIAGNOSIS — O99333 Smoking (tobacco) complicating pregnancy, third trimester: Secondary | ICD-10-CM | POA: Diagnosis not present

## 2017-12-20 DIAGNOSIS — Z9889 Other specified postprocedural states: Secondary | ICD-10-CM | POA: Insufficient documentation

## 2017-12-20 DIAGNOSIS — F17210 Nicotine dependence, cigarettes, uncomplicated: Secondary | ICD-10-CM | POA: Insufficient documentation

## 2017-12-20 DIAGNOSIS — Z79899 Other long term (current) drug therapy: Secondary | ICD-10-CM | POA: Insufficient documentation

## 2017-12-20 DIAGNOSIS — K219 Gastro-esophageal reflux disease without esophagitis: Secondary | ICD-10-CM | POA: Insufficient documentation

## 2017-12-20 DIAGNOSIS — Z833 Family history of diabetes mellitus: Secondary | ICD-10-CM | POA: Insufficient documentation

## 2017-12-20 DIAGNOSIS — O99613 Diseases of the digestive system complicating pregnancy, third trimester: Secondary | ICD-10-CM | POA: Diagnosis present

## 2017-12-20 DIAGNOSIS — O34219 Maternal care for unspecified type scar from previous cesarean delivery: Secondary | ICD-10-CM | POA: Insufficient documentation

## 2017-12-20 DIAGNOSIS — O479 False labor, unspecified: Secondary | ICD-10-CM

## 2017-12-20 DIAGNOSIS — Z8249 Family history of ischemic heart disease and other diseases of the circulatory system: Secondary | ICD-10-CM | POA: Insufficient documentation

## 2017-12-20 DIAGNOSIS — Z3A30 30 weeks gestation of pregnancy: Secondary | ICD-10-CM | POA: Insufficient documentation

## 2017-12-20 LAB — AMNISURE RUPTURE OF MEMBRANE (ROM) NOT AT ARMC: AMNISURE: NEGATIVE

## 2017-12-20 MED ORDER — CLOTRIMAZOLE 1 % VA CREA: 1.0000 | TOPICAL_CREAM | Freq: Every day | VAGINAL | 0 refills | Status: AC

## 2017-12-20 NOTE — MAU Note (Signed)
Urine in lab 

## 2017-12-20 NOTE — MAU Note (Signed)
Pt presents with c/o LOF since yesterday afternoon and ctxs that began last night @ approximately 2000.  Reports fluid is clear, has saturated panty liner.  Denies VB.  Reports +FM.

## 2017-12-20 NOTE — MAU Provider Note (Signed)
  History   Patient is a 27 yo G4P2 at 30 weeks 6 days with previous two c-sections presents to MAU with h/o of leaking of fluid since yesterday.  She called office and she was told to put on a pad and she notes that it was persistently "soaked".She denies vaginal bleeding, notes good fetal movements.   CSN: [id number]  Arrival date and time: 12/20/17 1416   None     Chief Complaint  Patient presents with  . Rupture of Membranes  . Contractions     Past Medical History:  Diagnosis Date  . GERD (gastroesophageal reflux disease)    with pregnancy   . Headache     Past Surgical History:  Procedure Laterality Date  . ADENOIDECTOMY    . CESAREAN SECTION    . CESAREAN SECTION N/A 01/16/2016   Procedure: CESAREAN SECTION;  Surgeon: Richarda Overlie, MD;  Location: [facility];  Service: Obstetrics;  Laterality: N/A;  Repeat edc 01/23/16 NKDA  . TONSILLECTOMY      Family History  Problem Relation Age of Onset  . Hypertension Mother   . Diabetes Maternal Grandmother     Social History   Tobacco Use  . Smoking status: Current Every Day Smoker    Packs/day: 0.25    Types: Cigarettes  . Smokeless tobacco: Never Used  Substance Use Topics  . Alcohol use: Yes    Comment: occasional - not during pregnancy  . Drug use: No    Allergies: No Known Allergies  Medications Prior to Admission  Medication Sig Dispense Refill Last Dose  . acetaminophen (TYLENOL) 500 MG tablet Take 1,000 mg by mouth every 6 (six) hours as needed for mild pain or headache.    12/19/2017 at Unknown time  . Prenatal Vit-Fe Fumarate-FA (PRENATAL MULTIVITAMIN) TABS tablet Take 1 tablet by mouth daily at 12 noon.   12/20/2017 at Unknown time    Review of Systems Physical Exam   Blood pressure 123/73, pulse 91, temperature 97.9 F (36.6 C), temperature source Oral, resp. rate 18, height 5\' 3"  (1.6 m), weight 84.1 kg (185 lb 8 oz), last menstrual period 05/11/2017, SpO2 99 %, unknown if currently  breastfeeding.  Physical Exam  GEN: AAOx3 NAD GU: Vaginal exam: +white yellow cheesy discharge no pooling        Cervix: Closed 40% effaced, mid position -4 station EFM: baseline 135 moderate variability accels no decels TOCO:  No contractions  MAU Course  Procedures Amnisure - negative  Assessment and Plan  27 yo with possible yeast infection no e/o amniotic rupture of membranes Discussed PTL precautions  Has f/u office visit on Monday Rx Clotrimazole  [name] [name] 12/20/2017, 4:46 PM

## 2018-01-26 ENCOUNTER — Inpatient Hospital Stay (HOSPITAL_COMMUNITY)
Admission: AD | Admit: 2018-01-26 | Discharge: 2018-01-26 | Disposition: A | Discharge status: Home / Self Care | Payer: Medicaid Other | Source: Ambulatory Visit | Attending: Obstetrics and Gynecology | Admitting: Obstetrics and Gynecology

## 2018-01-26 ENCOUNTER — Encounter (HOSPITAL_COMMUNITY): Payer: Self-pay

## 2018-01-26 ENCOUNTER — Other Ambulatory Visit: Payer: Self-pay

## 2018-01-26 DIAGNOSIS — O99613 Diseases of the digestive system complicating pregnancy, third trimester: Secondary | ICD-10-CM | POA: Insufficient documentation

## 2018-01-26 DIAGNOSIS — Z98891 History of uterine scar from previous surgery: Secondary | ICD-10-CM

## 2018-01-26 DIAGNOSIS — O26893 Other specified pregnancy related conditions, third trimester: Secondary | ICD-10-CM | POA: Insufficient documentation

## 2018-01-26 DIAGNOSIS — O09293 Supervision of pregnancy with other poor reproductive or obstetric history, third trimester: Secondary | ICD-10-CM | POA: Insufficient documentation

## 2018-01-26 DIAGNOSIS — F17210 Nicotine dependence, cigarettes, uncomplicated: Secondary | ICD-10-CM

## 2018-01-26 DIAGNOSIS — N898 Other specified noninflammatory disorders of vagina: Secondary | ICD-10-CM

## 2018-01-26 DIAGNOSIS — Z3A36 36 weeks gestation of pregnancy: Secondary | ICD-10-CM

## 2018-01-26 DIAGNOSIS — K219 Gastro-esophageal reflux disease without esophagitis: Secondary | ICD-10-CM | POA: Insufficient documentation

## 2018-01-26 DIAGNOSIS — O99333 Smoking (tobacco) complicating pregnancy, third trimester: Secondary | ICD-10-CM | POA: Insufficient documentation

## 2018-01-26 LAB — WET PREP, GENITAL
Clue Cells Wet Prep HPF POC: NONE SEEN
Sperm: NONE SEEN
Trich, Wet Prep: NONE SEEN
Yeast Wet Prep HPF POC: NONE SEEN

## 2018-01-26 LAB — GROUP B STREP BY PCR: GROUP B STREP BY PCR: NEGATIVE

## 2018-01-26 LAB — POCT FERN TEST: POCT FERN TEST: NEGATIVE

## 2018-01-26 NOTE — Progress Notes (Addendum)
G3P2 @ 36.[redacted] wksga. Here due to r/o SROM. Speculum exam, GBS, and wetprep done. SVE closed. Fern negative.   1855: Provider at bs reassessing with another nurse.   1932: Pt not in room so D/c instructions not given.

## 2018-01-26 NOTE — MAU Provider Note (Signed)
History   27 yo [id number] at 72 1/7 weeks presented after calling with ? Leaking since around 3:45pm.  Denies contractions, no recent IC, reports positive FM.  Scheduled for repeat C/S on 4/6 with BTL.  No GBS done yet.  Patient Active Problem List   Diagnosis Date Noted  . Previous cesarean section 01/26/2018  . 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 Complete miscarriage 08/20/2014    Chief Complaint  Patient presents with  . Rupture of Membranes   HPI:  See above  OB History    Gravida Para Term Preterm AB Living   4 2 2   1 2    SAB TAB Ectopic Multiple Live Births     1   0 2      Past Medical History:  Diagnosis Date  . GERD (gastroesophageal reflux disease)    with pregnancy   . Headache     Past Surgical History:  Procedure Laterality Date  . ADENOIDECTOMY    . CESAREAN SECTION    . CESAREAN SECTION N/A 01/16/2016   Procedure: CESAREAN SECTION;  Surgeon: Richarda Overlie, MD;  Location: [facility];  Service: Obstetrics;  Laterality: N/A;  Repeat edc 01/23/16 NKDA  . TONSILLECTOMY      Family History  Problem Relation Age of Onset  . Hypertension Mother   . Diabetes Maternal Grandmother     Social History   Tobacco Use  . Smoking status: Current Every Day Smoker    Packs/day: 0.25    Types: Cigarettes  . Smokeless tobacco: Never Used  Substance Use Topics  . Alcohol use: Yes    Comment: occasional - not during pregnancy  . Drug use: No    Allergies: No Known Allergies  Medications Prior to Admission  Medication Sig Dispense Refill Last Dose  . acetaminophen (TYLENOL) 500 MG tablet Take 1,000 mg by mouth every 6 (six) hours as needed for mild pain or headache.    12/19/2017 at Unknown time  . Prenatal Vit-Fe Fumarate-FA (PRENATAL MULTIVITAMIN) TABS tablet Take 1 tablet by mouth daily at 12 noon.   12/20/2017 at Unknown time    ROS:  Vaginal d/c, fetal movement.  Physical Exam   Blood pressure 129/74, pulse 92, temperature 98.2 F (36.8 C), temperature  source Oral, resp. rate 20, height 5\' 3"  (1.6 m), weight 85.5 kg (188 lb 8 oz), last menstrual period 05/11/2017, SpO2 98 %, unknown if currently breastfeeding.    Physical Exam  In NAD Chest clear Heart RRR without murmur Abd gravid, NT Pelvic--sterile speculum exam x 2, negative fern x 2.  Cervix closed, long, vtx, -3. Ext WNL  FHR Category 1 No UCs.  Fern negative x 2 Results for orders placed or performed during the hospital encounter of 01/26/18 (from the past 24 hour(s))  Wet prep, genital     Status: Abnormal   Collection Time: 01/26/18  6:10 PM  Result Value Ref Range   Yeast Wet Prep HPF POC NONE SEEN NONE SEEN   Trich, Wet Prep NONE SEEN NONE SEEN   Clue Cells Wet Prep HPF POC NONE SEEN NONE SEEN   WBC, Wet Prep HPF POC MODERATE (A) NONE SEEN   Sperm NONE SEEN   Fern Test     Status: Normal   Collection Time: 01/26/18  6:14 PM  Result Value Ref Range   POCT Fern Test Negative = intact amniotic membranes     GBS done today.  ED Course  Assessment: IUP at 36 1/7 weeks No evidence  SROM Hx prior C/S x 2, plans repeat with BTL 02/15/18 GBS done today  Plan: D/c home with discussion of s/s of SROM, labor. Keep scheduled ROB appt at [facility] tomorrow. Await GBS results.   Nigel Bridgeman CNM, MSN 01/26/2018 7:00 PM

## 2018-01-26 NOTE — MAU Note (Signed)
Pt presents with c/o LOF since approximately 1500.  Reports clear fluid, intermittently gushing out.  Denies ctxs or VB.  Reports +FM.

## 2018-01-26 NOTE — Discharge Instructions (Signed)
Call for any questions or concerns.

## 2018-01-27 ENCOUNTER — Encounter (HOSPITAL_COMMUNITY)
Admission: AD | Disposition: A | Discharge status: Home / Self Care | Payer: Self-pay | Source: Ambulatory Visit | Attending: Obstetrics and Gynecology

## 2018-01-27 ENCOUNTER — Inpatient Hospital Stay (HOSPITAL_COMMUNITY): Payer: Medicaid Other | Admitting: Certified Registered Nurse Anesthetist

## 2018-01-27 ENCOUNTER — Encounter (HOSPITAL_COMMUNITY): Payer: Self-pay

## 2018-01-27 ENCOUNTER — Inpatient Hospital Stay (HOSPITAL_COMMUNITY)
Admission: AD | Admit: 2018-01-27 | Discharge: 2018-01-30 | DRG: 784 | Disposition: A | Discharge status: Home / Self Care | Payer: Medicaid Other | Source: Ambulatory Visit | Attending: Obstetrics and Gynecology | Admitting: Obstetrics and Gynecology

## 2018-01-27 DIAGNOSIS — O99214 Obesity complicating childbirth: Secondary | ICD-10-CM | POA: Diagnosis present

## 2018-01-27 DIAGNOSIS — O34211 Maternal care for low transverse scar from previous cesarean delivery: Secondary | ICD-10-CM | POA: Diagnosis present

## 2018-01-27 DIAGNOSIS — Z302 Encounter for sterilization: Secondary | ICD-10-CM | POA: Diagnosis not present

## 2018-01-27 DIAGNOSIS — F17210 Nicotine dependence, cigarettes, uncomplicated: Secondary | ICD-10-CM | POA: Diagnosis present

## 2018-01-27 DIAGNOSIS — O9081 Anemia of the puerperium: Secondary | ICD-10-CM | POA: Diagnosis not present

## 2018-01-27 DIAGNOSIS — Z98891 History of uterine scar from previous surgery: Secondary | ICD-10-CM

## 2018-01-27 DIAGNOSIS — O4292 Full-term premature rupture of membranes, unspecified as to length of time between rupture and onset of labor: Secondary | ICD-10-CM | POA: Diagnosis present

## 2018-01-27 DIAGNOSIS — Z3A36 36 weeks gestation of pregnancy: Secondary | ICD-10-CM | POA: Diagnosis not present

## 2018-01-27 DIAGNOSIS — E669 Obesity, unspecified: Secondary | ICD-10-CM | POA: Diagnosis present

## 2018-01-27 DIAGNOSIS — O99334 Smoking (tobacco) complicating childbirth: Secondary | ICD-10-CM | POA: Diagnosis present

## 2018-01-27 DIAGNOSIS — D62 Acute posthemorrhagic anemia: Secondary | ICD-10-CM | POA: Diagnosis not present

## 2018-01-27 LAB — AMNISURE RUPTURE OF MEMBRANE (ROM) NOT AT ARMC: Amnisure ROM: POSITIVE

## 2018-01-27 LAB — RPR: RPR: NONREACTIVE

## 2018-01-27 LAB — POCT FERN TEST: POCT Fern Test: POSITIVE

## 2018-01-27 LAB — CBC
HCT: 33.6 % — ABNORMAL LOW (ref 36.0–46.0)
Hemoglobin: 11.5 g/dL — ABNORMAL LOW (ref 12.0–15.0)
MCH: 29 pg (ref 26.0–34.0)
MCHC: 34.2 g/dL (ref 30.0–36.0)
MCV: 84.6 fL (ref 78.0–100.0)
PLATELETS: 254 10*3/uL (ref 150–400)
RBC: 3.97 MIL/uL (ref 3.87–5.11)
RDW: 13.5 % (ref 11.5–15.5)
WBC: 7.9 10*3/uL (ref 4.0–10.5)

## 2018-01-27 LAB — TYPE AND SCREEN
ABO/RH(D): O POS
Antibody Screen: NEGATIVE

## 2018-01-27 SURGERY — Surgical Case
Anesthesia: Spinal | Laterality: Bilateral

## 2018-01-27 MED ORDER — OXYTOCIN 10 UNIT/ML IJ SOLN
INTRAMUSCULAR | Status: AC
  Filled 2018-01-27: qty 1

## 2018-01-27 MED ORDER — DIBUCAINE 1 % RE OINT: 1.0000 "application " | TOPICAL_OINTMENT | RECTAL | Status: DC | PRN

## 2018-01-27 MED ORDER — PHENYLEPHRINE 8 MG IN D5W 100 ML (0.08MG/ML) PREMIX OPTIME
INJECTION | INTRAVENOUS | Status: DC | PRN
  Administered 2018-01-27: 60 ug/min via INTRAVENOUS

## 2018-01-27 MED ORDER — OXYTOCIN 40 UNITS IN LACTATED RINGERS INFUSION - SIMPLE MED: 2.5000 [IU]/h | INTRAVENOUS | Status: AC

## 2018-01-27 MED ORDER — ZOLPIDEM TARTRATE 5 MG PO TABS: 5.0000 mg | ORAL_TABLET | Freq: Every evening | ORAL | Status: DC | PRN

## 2018-01-27 MED ORDER — SIMETHICONE 80 MG PO CHEW: 80.0000 mg | CHEWABLE_TABLET | ORAL | Status: DC | PRN

## 2018-01-27 MED ORDER — ONDANSETRON HCL 4 MG/2ML IJ SOLN
4.0000 mg | Freq: Three times a day (TID) | INTRAMUSCULAR | Status: DC | PRN
  Administered 2018-01-27 (×2): 4 mg via INTRAVENOUS
  Filled 2018-01-27 (×2): qty 2

## 2018-01-27 MED ORDER — ACETAMINOPHEN 325 MG PO TABS
650.0000 mg | ORAL_TABLET | ORAL | Status: DC | PRN
  Administered 2018-01-27 – 2018-01-30 (×13): 650 mg via ORAL
  Filled 2018-01-27 (×14): qty 2

## 2018-01-27 MED ORDER — CEFAZOLIN SODIUM-DEXTROSE 2-4 GM/100ML-% IV SOLN: 2.0000 g | INTRAVENOUS | Status: DC

## 2018-01-27 MED ORDER — LACTATED RINGERS IV SOLN
INTRAVENOUS | Status: DC
  Administered 2018-01-27 (×3): via INTRAVENOUS

## 2018-01-27 MED ORDER — METOCLOPRAMIDE HCL 5 MG/ML IJ SOLN: 10.0000 mg | Freq: Once | INTRAMUSCULAR | Status: DC | PRN

## 2018-01-27 MED ORDER — COCONUT OIL OIL: 1.0000 "application " | TOPICAL_OIL | Status: DC | PRN

## 2018-01-27 MED ORDER — ONDANSETRON HCL 4 MG/2ML IJ SOLN
INTRAMUSCULAR | Status: AC
  Filled 2018-01-27: qty 2

## 2018-01-27 MED ORDER — FENTANYL CITRATE (PF) 100 MCG/2ML IJ SOLN: 25.0000 ug | INTRAMUSCULAR | Status: DC | PRN

## 2018-01-27 MED ORDER — METHYLERGONOVINE MALEATE 0.2 MG/ML IJ SOLN: 0.2000 mg | INTRAMUSCULAR | Status: DC | PRN

## 2018-01-27 MED ORDER — SOD CITRATE-CITRIC ACID 500-334 MG/5ML PO SOLN
30.0000 mL | Freq: Once | ORAL | Status: AC
  Administered 2018-01-27: 30 mL via ORAL

## 2018-01-27 MED ORDER — MORPHINE SULFATE (PF) 0.5 MG/ML IJ SOLN
INTRAMUSCULAR | Status: AC
  Filled 2018-01-27: qty 10

## 2018-01-27 MED ORDER — METHYLERGONOVINE MALEATE 0.2 MG PO TABS: 0.2000 mg | ORAL_TABLET | ORAL | Status: DC | PRN

## 2018-01-27 MED ORDER — SODIUM CHLORIDE 0.9 % IR SOLN
Status: DC | PRN
  Administered 2018-01-27: 1

## 2018-01-27 MED ORDER — CEFAZOLIN SODIUM-DEXTROSE 2-4 GM/100ML-% IV SOLN
2.0000 g | INTRAVENOUS | Status: AC
  Administered 2018-01-27: 2 g via INTRAVENOUS

## 2018-01-27 MED ORDER — NALOXONE HCL 0.4 MG/ML IJ SOLN: 0.4000 mg | INTRAMUSCULAR | Status: DC | PRN

## 2018-01-27 MED ORDER — MENTHOL 3 MG MT LOZG
1.0000 | LOZENGE | OROMUCOSAL | Status: DC | PRN
Start: 2018-01-27 — End: 2018-01-30

## 2018-01-27 MED ORDER — WITCH HAZEL-GLYCERIN EX PADS: 1.0000 "application " | MEDICATED_PAD | CUTANEOUS | Status: DC | PRN

## 2018-01-27 MED ORDER — MEPERIDINE HCL 25 MG/ML IJ SOLN: 6.2500 mg | INTRAMUSCULAR | Status: DC | PRN

## 2018-01-27 MED ORDER — SODIUM CHLORIDE 0.9% FLUSH: 3.0000 mL | INTRAVENOUS | Status: DC | PRN

## 2018-01-27 MED ORDER — FAMOTIDINE IN NACL 20-0.9 MG/50ML-% IV SOLN
20.0000 mg | Freq: Once | INTRAVENOUS | Status: AC
  Administered 2018-01-27: 20 mg via INTRAVENOUS

## 2018-01-27 MED ORDER — TETANUS-DIPHTH-ACELL PERTUSSIS 5-2.5-18.5 LF-MCG/0.5 IM SUSP: 0.5000 mL | Freq: Once | INTRAMUSCULAR | Status: DC

## 2018-01-27 MED ORDER — BUPIVACAINE IN DEXTROSE 0.75-8.25 % IT SOLN
INTRATHECAL | Status: DC | PRN
  Administered 2018-01-27: 1.6 mL via INTRATHECAL

## 2018-01-27 MED ORDER — DEXAMETHASONE SODIUM PHOSPHATE 4 MG/ML IJ SOLN
INTRAMUSCULAR | Status: DC | PRN
  Administered 2018-01-27: 4 mg via INTRAVENOUS

## 2018-01-27 MED ORDER — OXYTOCIN 10 UNIT/ML IJ SOLN
INTRAVENOUS | Status: DC | PRN
  Administered 2018-01-27: 40 [IU] via INTRAVENOUS

## 2018-01-27 MED ORDER — ONDANSETRON HCL 4 MG/2ML IJ SOLN
INTRAMUSCULAR | Status: DC | PRN
  Administered 2018-01-27: 4 mg via INTRAVENOUS

## 2018-01-27 MED ORDER — OXYCODONE HCL 5 MG PO TABS
10.0000 mg | ORAL_TABLET | ORAL | Status: DC | PRN
  Administered 2018-01-28 – 2018-01-30 (×12): 10 mg via ORAL
  Filled 2018-01-27 (×13): qty 2

## 2018-01-27 MED ORDER — NALOXONE HCL 4 MG/10ML IJ SOLN
1.0000 ug/kg/h | INTRAVENOUS | Status: DC | PRN
  Filled 2018-01-27: qty 5

## 2018-01-27 MED ORDER — KETOROLAC TROMETHAMINE 30 MG/ML IJ SOLN
30.0000 mg | Freq: Four times a day (QID) | INTRAMUSCULAR | Status: AC | PRN
  Administered 2018-01-27: 30 mg via INTRAMUSCULAR

## 2018-01-27 MED ORDER — SCOPOLAMINE 1 MG/3DAYS TD PT72
MEDICATED_PATCH | TRANSDERMAL | Status: AC
  Filled 2018-01-27: qty 1

## 2018-01-27 MED ORDER — PHENYLEPHRINE 8 MG IN D5W 100 ML (0.08MG/ML) PREMIX OPTIME
INJECTION | INTRAVENOUS | Status: AC
  Filled 2018-01-27: qty 100

## 2018-01-27 MED ORDER — DEXAMETHASONE SODIUM PHOSPHATE 4 MG/ML IJ SOLN
INTRAMUSCULAR | Status: AC
  Filled 2018-01-27: qty 1

## 2018-01-27 MED ORDER — KETOROLAC TROMETHAMINE 30 MG/ML IJ SOLN
INTRAMUSCULAR | Status: AC
  Filled 2018-01-27: qty 1

## 2018-01-27 MED ORDER — SENNOSIDES-DOCUSATE SODIUM 8.6-50 MG PO TABS
2.0000 | ORAL_TABLET | ORAL | Status: DC
  Administered 2018-01-27 – 2018-01-29 (×2): 2 via ORAL
  Filled 2018-01-27 (×3): qty 2

## 2018-01-27 MED ORDER — FAMOTIDINE IN NACL 20-0.9 MG/50ML-% IV SOLN
INTRAVENOUS | Status: AC
  Administered 2018-01-27: 20 mg via INTRAVENOUS
  Filled 2018-01-27: qty 50

## 2018-01-27 MED ORDER — KETOROLAC TROMETHAMINE 30 MG/ML IJ SOLN
30.0000 mg | Freq: Four times a day (QID) | INTRAMUSCULAR | Status: AC | PRN
  Administered 2018-01-27: 30 mg via INTRAVENOUS
  Filled 2018-01-27: qty 1

## 2018-01-27 MED ORDER — DIPHENHYDRAMINE HCL 50 MG/ML IJ SOLN
12.5000 mg | INTRAMUSCULAR | Status: DC | PRN
Start: 2018-01-27 — End: 2018-01-30

## 2018-01-27 MED ORDER — FENTANYL CITRATE (PF) 100 MCG/2ML IJ SOLN
INTRAMUSCULAR | Status: AC
  Filled 2018-01-27: qty 2

## 2018-01-27 MED ORDER — NALBUPHINE HCL 10 MG/ML IJ SOLN: 5.0000 mg | Freq: Once | INTRAMUSCULAR | Status: DC | PRN

## 2018-01-27 MED ORDER — LACTATED RINGERS IV SOLN
INTRAVENOUS | Status: DC
  Administered 2018-01-27 (×2): via INTRAVENOUS

## 2018-01-27 MED ORDER — SCOPOLAMINE 1 MG/3DAYS TD PT72
MEDICATED_PATCH | TRANSDERMAL | Status: DC | PRN
  Administered 2018-01-27: 1 via TRANSDERMAL

## 2018-01-27 MED ORDER — LACTATED RINGERS IV BOLUS (SEPSIS)
1000.0000 mL | Freq: Once | INTRAVENOUS | Status: AC
  Administered 2018-01-27: 1000 mL via INTRAVENOUS

## 2018-01-27 MED ORDER — OXYCODONE HCL 5 MG PO TABS
5.0000 mg | ORAL_TABLET | ORAL | Status: DC | PRN
  Administered 2018-01-27: 5 mg via ORAL
  Filled 2018-01-27 (×3): qty 1

## 2018-01-27 MED ORDER — SOD CITRATE-CITRIC ACID 500-334 MG/5ML PO SOLN
ORAL | Status: AC
  Filled 2018-01-27: qty 15

## 2018-01-27 MED ORDER — FENTANYL CITRATE (PF) 100 MCG/2ML IJ SOLN
INTRAMUSCULAR | Status: DC | PRN
  Administered 2018-01-27: 20 ug via INTRATHECAL

## 2018-01-27 MED ORDER — TRIAMCINOLONE ACETONIDE 40 MG/ML IJ SUSP
40.0000 mg | Freq: Once | INTRAMUSCULAR | Status: DC | PRN
  Filled 2018-01-27: qty 1

## 2018-01-27 MED ORDER — IBUPROFEN 600 MG PO TABS
600.0000 mg | ORAL_TABLET | Freq: Four times a day (QID) | ORAL | Status: DC
  Administered 2018-01-27 – 2018-01-28 (×3): 600 mg via ORAL
  Filled 2018-01-27 (×3): qty 1

## 2018-01-27 MED ORDER — MORPHINE SULFATE (PF) 0.5 MG/ML IJ SOLN
INTRAMUSCULAR | Status: DC | PRN
  Administered 2018-01-27: .2 mg via INTRATHECAL

## 2018-01-27 MED ORDER — NALBUPHINE HCL 10 MG/ML IJ SOLN
5.0000 mg | INTRAMUSCULAR | Status: DC | PRN
  Administered 2018-01-27: 5 mg via INTRAVENOUS
  Filled 2018-01-27: qty 1

## 2018-01-27 MED ORDER — PRENATAL MULTIVITAMIN CH
1.0000 | ORAL_TABLET | Freq: Every day | ORAL | Status: DC
  Administered 2018-01-28 – 2018-01-29 (×2): 1 via ORAL
  Filled 2018-01-27 (×2): qty 1

## 2018-01-27 MED ORDER — NALBUPHINE HCL 10 MG/ML IJ SOLN
5.0000 mg | INTRAMUSCULAR | Status: DC | PRN
  Administered 2018-01-28: 5 mg via SUBCUTANEOUS
  Filled 2018-01-27: qty 1

## 2018-01-27 MED ORDER — SIMETHICONE 80 MG PO CHEW
80.0000 mg | CHEWABLE_TABLET | ORAL | Status: DC
  Administered 2018-01-27 – 2018-01-30 (×3): 80 mg via ORAL
  Filled 2018-01-27 (×3): qty 1

## 2018-01-27 MED ORDER — DIPHENHYDRAMINE HCL 25 MG PO CAPS
25.0000 mg | ORAL_CAPSULE | Freq: Four times a day (QID) | ORAL | Status: DC | PRN
Start: 2018-01-27 — End: 2018-01-30

## 2018-01-27 MED ORDER — DIPHENHYDRAMINE HCL 25 MG PO CAPS
25.0000 mg | ORAL_CAPSULE | ORAL | Status: DC | PRN
  Filled 2018-01-27: qty 1

## 2018-01-27 MED ORDER — SIMETHICONE 80 MG PO CHEW
80.0000 mg | CHEWABLE_TABLET | Freq: Three times a day (TID) | ORAL | Status: DC
  Administered 2018-01-27 – 2018-01-30 (×8): 80 mg via ORAL
  Filled 2018-01-27 (×8): qty 1

## 2018-01-27 SURGICAL SUPPLY — 47 items
APL SKNCLS STERI-STRIP NONHPOA (GAUZE/BANDAGES/DRESSINGS) ×1
BARRIER ADHS 3X4 INTERCEED (GAUZE/BANDAGES/DRESSINGS) ×3 IMPLANT
BENZOIN TINCTURE PRP APPL 2/3 (GAUZE/BANDAGES/DRESSINGS) ×3 IMPLANT
BRR ADH 4X3 ABS CNTRL BYND (GAUZE/BANDAGES/DRESSINGS) ×1
CHLORAPREP W/TINT 26ML (MISCELLANEOUS) ×3 IMPLANT
CLAMP CORD UMBIL (MISCELLANEOUS) IMPLANT
CLOSURE STERI STRIP 1/2 X4 (GAUZE/BANDAGES/DRESSINGS) ×2 IMPLANT
CLOSURE WOUND 1/2 X4 (GAUZE/BANDAGES/DRESSINGS) ×1
CLOTH BEACON ORANGE TIMEOUT ST (SAFETY) ×3 IMPLANT
DRSG OPSITE POSTOP 4X10 (GAUZE/BANDAGES/DRESSINGS) ×3 IMPLANT
ELECT REM PT RETURN 9FT ADLT (ELECTROSURGICAL) ×3
ELECTRODE REM PT RTRN 9FT ADLT (ELECTROSURGICAL) ×1 IMPLANT
EXTRACTOR VACUUM M CUP 4 TUBE (SUCTIONS) IMPLANT
EXTRACTOR VACUUM M CUP 4' TUBE (SUCTIONS)
GAUZE SPONGE 4X4 12PLY STRL LF (GAUZE/BANDAGES/DRESSINGS) ×3 IMPLANT
GLOVE BIO SURGEON STRL SZ7.5 (GLOVE) ×3 IMPLANT
GLOVE BIOGEL PI IND STRL 7.0 (GLOVE) ×2 IMPLANT
GLOVE BIOGEL PI IND STRL 7.5 (GLOVE) ×1 IMPLANT
GLOVE BIOGEL PI INDICATOR 7.0 (GLOVE) ×4
GLOVE BIOGEL PI INDICATOR 7.5 (GLOVE) ×2
GOWN STRL REUS W/TWL LRG LVL3 (GOWN DISPOSABLE) ×9 IMPLANT
KIT ABG SYR 3ML LUER SLIP (SYRINGE) IMPLANT
NEEDLE HYPO 22GX1.5 SAFETY (NEEDLE) IMPLANT
NEEDLE HYPO 25X5/8 SAFETYGLIDE (NEEDLE) IMPLANT
NS IRRIG 1000ML POUR BTL (IV SOLUTION) ×3 IMPLANT
PACK C SECTION WH (CUSTOM PROCEDURE TRAY) ×3 IMPLANT
PAD OB MATERNITY 4.3X12.25 (PERSONAL CARE ITEMS) ×3 IMPLANT
PENCIL SMOKE EVAC W/HOLSTER (ELECTROSURGICAL) ×3 IMPLANT
RTRCTR C-SECT PINK 25CM LRG (MISCELLANEOUS) ×3 IMPLANT
STRIP CLOSURE SKIN 1/2X4 (GAUZE/BANDAGES/DRESSINGS) ×2 IMPLANT
SUT CHROMIC 2 0 CT 1 (SUTURE) ×3 IMPLANT
SUT MNCRL AB 3-0 PS2 27 (SUTURE) ×3 IMPLANT
SUT PDS AB 0 CTX 60 (SUTURE) ×3 IMPLANT
SUT PLAIN 2 0 (SUTURE) ×2
SUT PLAIN 2 0 XLH (SUTURE) ×3 IMPLANT
SUT PLAIN ABS 2-0 CT1 27XMFL (SUTURE) ×1 IMPLANT
SUT VIC AB 0 CT1 36 (SUTURE) ×3 IMPLANT
SUT VIC AB 0 CTX 36 (SUTURE) ×9
SUT VIC AB 0 CTX36XBRD ANBCTRL (SUTURE) ×3 IMPLANT
SUT VIC AB 2-0 SH 27 (SUTURE) ×6
SUT VIC AB 2-0 SH 27XBRD (SUTURE) ×2 IMPLANT
SUT VIC AB 4-0 KS 27 (SUTURE) ×3 IMPLANT
SYR CONTROL 10ML LL (SYRINGE) IMPLANT
TAPE MEDIFIX FOAM 3 (GAUZE/BANDAGES/DRESSINGS) ×3 IMPLANT
TOWEL OR 17X24 6PK STRL BLUE (TOWEL DISPOSABLE) ×3 IMPLANT
TRAY FOLEY BAG SILVER LF 14FR (SET/KITS/TRAYS/PACK) ×3 IMPLANT
WATER STERILE IRR 1000ML POUR (IV SOLUTION) ×3 IMPLANT

## 2018-01-27 NOTE — Progress Notes (Signed)
Pt diet no longer advancing.  Shortly after previous progress note, pt vomited and was given zofran.  Pt then advanced diet slowly from ice chips to crackers before vomiting again.  Pt began advancing diet from ice chips for a third time and ordered soup from the cafeteria before vomiting again.  She denies nausea in between periods of vomiting.  She reports she is passing gas.  Bowel sounds are hypoactive and the abdomen is mildly distended but soft. Message left with Dr. Mora Appl, awaiting response.

## 2018-01-27 NOTE — Plan of Care (Signed)
Admission education and paperwork discussed and signed.  Pt verbalized understanding of fall safety, baby safety, how to use the call bell, and how to contact the RN/NT.   Pt tolerating apple juice and saltines.   Pt diet advancing well.

## 2018-01-27 NOTE — Progress Notes (Signed)
In to consent pt for cesarean section. Pt appears comfortable.   R/B/A of procedure reviewed.  Pt desires BTL.  Declines alternatives. Reassuring fetal status.

## 2018-01-27 NOTE — Op Note (Signed)
[name], [name] NO.:  [id number]  MEDICAL RECORD NO.:  [id number]  LOCATION:  [facility]                          FACILITY:  [facility]  PHYSICIAN:  Pieter Partridge, MD   DATE OF BIRTH:  01-14-1991  DATE OF PROCEDURE:  01/27/2018 DATE OF DISCHARGE:                              OPERATIVE REPORT   PREOPERATIVE DIAGNOSIS:  Intrauterine pregnancy at 62 and 2/7tn weeks preterm premature rupture of the membranes, prior cesarean section, desire for surgical sterilization.  POSTOPERATIVE DIAGNOSIS:  Intrauterine pregnancy at 17 and 2/7tn weeks preterm premature rupture of the membranes, prior cesarean section, desire for surgical sterilization.  PROCEDURE:  Repeat cesarean section with partial salpingectomy with bilateral partial salpingectomy.  SURGEON:  Pieter Partridge, MD  ASSISTANT:  Rhea Pink, certified nurse midwife.  ANESTHESIA:  Spinal.  EBL:  600.  BLOOD ADMINISTERED:  None.  DRAINS:  Foley catheter.  LOCAL:  None.  SPECIMEN:  Placenta, bilateral fimbriae.  DISPOSITION OF SPECIMEN:  To Pathology.  COUNTS CORRECT:  Yes.  PATIENT DISPOSITION:  To PACU, hemodynamically stable.  COMPLICATIONS:  None.  FINDINGS:  Viable female infant in the vertex position, clear fluid. Apgars 8 and 9.  Weight 5 pounds and 11 ounces.  Normal uterus, fallopian tubes, and ovaries bilaterally.  Adhesions of the peritoneum, rectus muscles and fascia at the midline.  Lower uterine segment was thick.  INDICATIONS:  Ms. Trippett presented with a complaint of leakage of fluid.  Rupture time was unsure.  It was suspected that maybe at least 12-24 hours prior to presentation, rupture of membranes was confirmed by exam and ferning.  The patient has started to contract, but was not feeling them.  Cervix was closed.  The patient had two previous C- sections and desired sterilization.  She was then counseled on the risks, benefits and alternatives to repeat  C-sections.  The patient was taken to the operating room with IV running.  She was underwent spinal anesthesia without complication and then was placed in the dorsal supine position with a leftward tilt.  SCDs were on her legs and operating Ancef 2 g IV was infused.  She was prepped and draped in normal sterile fashion.  Foley catheter placed.  A time-out was performed.  The patient was draped.  Adequate anesthesia was confirmed.  Abdomen was marked over previous incision.  Scalpel was used to make a Pfannenstiel skin incision, it was carried down to the underlying layer of the fascia on cut on the Bovie.  The fascia was very dense.  Incision was then extended laterally with the curved Mayo scissors.  The rectus muscles were then dissected sharply off the fascia.  The rectus muscles were achieved at the midline.  Two Kocher clamps were used and the scalpel was used to dissect layer by layer until the peritoneum was visualized. This was done very high to avoid the bladder.  Once intraabdominal access was confirmed, the peritoneum was stretched. The bladder was examined to make sure there was no injury, of which, there was none.  The abdomen was palpated.  The uterus did not have any adhesion.  The Alexis retractor was inserted.  A transverse incision was made  on the lower uterine segment after a bladder flap was developed.  The incision was then extended with the bandage scissors.  With fundal pressure, the head was brought to the incision and with fundal pressure, the baby was delivered easily as well as shoulders.  Suction of the nose and mouth was performed.  Delayed cord clamping was performed at 1 minute.  The cord was clamped x2 and cut and handed off to the awaiting NICU team.  While we waiting for cord clamp, the angles of the hysterotomy incision were clamped with the ring forceps.  Those were removed to deliver the placenta.  The uterus firmed up nice and easily.  So, the  placenta was removed intact.  Uterus was cleared of all clots and debris with two moist laparotomy sponges.  The hysterotomy incision was then reapproximated with 2-0 Vicryl in a continuous locked fashion.  A second layer of imbrication was performed in a continuous locked fashion.  Hemostasis was achieved with 2-0 Vicryl in one area.  There was a moistened sponge that was rolled up at the incision while we turned attention to the fallopian tubes.  A proximal salpingectomy and a partial salpingectomy were performed, transecting the ovarian suspension ligament.  Two Kelly clamps were used to come across the fimbriae to middle portion of the fallopian tube.  With the Bovie, the tubes were dissected in between the [facility] clamps.  A Haney stitch of 0 Vicryl was then used.  Tresa Endo was flashed.  A second layer of plain gut was used. The Tresa Endo was removed and no bleeding was noted.  The same thing was done on the opposite side.  Once the tubal ligation was performed, we looked at the hysterotomy incision, it looked normal.  Irrigation was performed in the gutters. Interceed was then applied over the incision.  I wanted to reapproximate the peritoneum; however, it was scarred and adhesed and difficulty to identify.  So, I performed plication of the rectus muscles with the U-stitch using the 0 Vicryl and another single interrupted stitch of 0 Vicryl at the bottom.  Copious irrigation was performed.  At this point, Dr. Levie Heritage relieved me from the surgery due to shift change.  She would close the fascia with 0 Vicryl in a continuous running fashion.  Subcutaneous space with plain gut and reapproximate the skin with 4-0 Monocryl.  Steri-Strips, benzoin were to be applied, pressure dressing.  All instrument, sponge and needle counts were correct x3.  The patient tolerated the procedure well.  She was awake the entire procedure.  Baby was stayed in the room the entire procedure.     Pieter Partridge, MD     EBV/MEDQ  D:  01/27/2018  T:  01/27/2018  Job:  [id number]

## 2018-01-27 NOTE — Brief Op Note (Signed)
01/27/2018  7:08 AM  PATIENT:  [name]  27 y.o. female  PRE-OPERATIVE DIAGNOSIS:  IUP @ 36/27 weeks, PPROM, Prior cesarean section, Desire for surgical sterilization  POST-OPERATIVE DIAGNOSIS:  Same  PROCEDURE:   REPEAT LTCS CESAREAN SECTION, 2 LAYER CLOSURE BILATERAL FIMBRIECTOMY (PARTIAL SALPINGECTOMY)   SURGEON:  Surgeon(s) and Role:    *Geryl Rankins, MD - Primary  PHYSICIAN ASSISTANT:   ASSISTANTS: Illene Bolus, CNM   ANESTHESIA:   spinal  EBL:  600 mL   BLOOD ADMINISTERED:none  DRAINS: Urinary Catheter (Foley)   LOCAL MEDICATIONS USED:  NONE  SPECIMEN:  Source of Specimen:  Placenta, Bilateral fimbriae   DISPOSITION OF SPECIMEN:  PATHOLOGY  COUNTS:  YES TOURNIQUET:  * No tourniquets in log *  DICTATION: .Other Dictation: Dictation Number [id number]  PLAN OF CARE: Admit to inpatient   PATIENT DISPOSITION:  PACU - hemodynamically stable.   Delay start of Pharmacological VTE agent (>24hrs) due to surgical blood loss or risk of bleeding: yes

## 2018-01-27 NOTE — Anesthesia Preprocedure Evaluation (Signed)
Anesthesia Evaluation  Patient identified by MRN, date of birth, ID band Patient awake    Reviewed: Allergy & Precautions, NPO status , Patient's Chart, lab work & pertinent test results  Airway Mallampati: II  TM Distance: >3 FB Neck ROM: Full    Dental no notable dental hx. (+) Teeth Intact   Pulmonary Current Smoker,    Pulmonary exam normal breath sounds clear to auscultation       Cardiovascular negative cardio ROS Normal cardiovascular exam Rhythm:Regular Rate:Normal     Neuro/Psych  Headaches, negative psych ROS   GI/Hepatic Neg liver ROS, GERD  Controlled and Medicated,  Endo/Other  Obesity  Renal/GU negative Renal ROS  negative genitourinary   Musculoskeletal   Abdominal (+) + obese,   Peds  Hematology  (+) anemia ,   Anesthesia Other Findings   Reproductive/Obstetrics (+) Pregnancy Previous C/Section x 2 Desires sterilization                             Anesthesia Physical Anesthesia Plan  ASA: II and emergent  Anesthesia Plan: Spinal   Post-op Pain Management:    Induction:   PONV Risk Score and Plan: 4 or greater and Scopolamine patch - Pre-op, Dexamethasone, Ondansetron and Treatment may vary due to age or medical condition  Airway Management Planned: Natural Airway  Additional Equipment:   Intra-op Plan:   Post-operative Plan:   Informed Consent: I have reviewed the patients History and Physical, chart, labs and discussed the procedure including the risks, benefits and alternatives for the proposed anesthesia with the patient or authorized representative who has indicated his/her understanding and acceptance.   Dental advisory given  Plan Discussed with: CRNA, Anesthesiologist and Surgeon  Anesthesia Plan Comments:         Anesthesia Quick Evaluation

## 2018-01-27 NOTE — Anesthesia Postprocedure Evaluation (Signed)
Anesthesia Post Note  Patient: [name]  Procedure(s) Performed: REPEAT CESAREAN SECTION WITH BILATERAL TUBAL LIGATION (Bilateral )     Patient location during evaluation: PACU Anesthesia Type: Spinal Level of consciousness: awake and alert and oriented Pain management: pain level controlled Vital Signs Assessment: post-procedure vital signs reviewed and stable Respiratory status: spontaneous breathing, nonlabored ventilation and respiratory function stable Cardiovascular status: blood pressure returned to baseline and stable Postop Assessment: no headache, no backache, spinal receding, patient able to bend at knees and no apparent nausea or vomiting Anesthetic complications: no    Last Vitals:  Vitals:   01/27/18 0753 01/27/18 0754  BP:    Pulse: 90 78  Resp: 17 20  Temp:    SpO2: 100% 100%    Last Pain:  Vitals:   01/27/18 0338  TempSrc: Oral   Pain Goal:                 [name] A.

## 2018-01-27 NOTE — Anesthesia Procedure Notes (Signed)
Spinal  Patient location during procedure: OR Start time: 01/27/2018 5:54 AM Staffing Anesthesiologist: Mal Amabile, MD Performed: anesthesiologist  Preanesthetic Checklist Completed: patient identified, site marked, surgical consent, pre-op evaluation, timeout performed, IV checked, risks and benefits discussed and monitors and equipment checked Spinal Block Patient position: sitting Prep: site prepped and draped and DuraPrep Patient monitoring: heart rate, cardiac monitor, continuous pulse ox and blood pressure Approach: midline Location: L3-4 Injection technique: single-shot Needle Needle type: Pencan  Needle gauge: 24 G Needle length: 9 cm Needle insertion depth: 7 cm Assessment Sensory level: T4 Additional Notes Patient tolerated procedure well. Adequate sensory level.

## 2018-01-27 NOTE — Progress Notes (Signed)
Pt diet still transitioning to solid food. Provider was notified and instructed RN to give Zofran and then give the oxycodone approximately 30 minutes after to assist with pain control. Bowel sounds are audible, RLQ and LLQ are hypoactive and abdomen is soft with mild distention.   Leeloo Silverthorne L Sione Baumgarten, RN

## 2018-01-27 NOTE — Transfer of Care (Signed)
Immediate Anesthesia Transfer of Care Note  Patient: [name]  Procedure(s) Performed: REPEAT CESAREAN SECTION WITH BILATERAL TUBAL LIGATION (Bilateral )  Patient Location: PACU  Anesthesia Type:Spinal  Level of Consciousness: awake, alert  and oriented  Airway & Oxygen Therapy: Patient Spontanous Breathing  Post-op Assessment: Report given to RN and Post -op Vital signs reviewed and stable  Post vital signs: Reviewed and stable  Last Vitals:  Vitals:   01/27/18 0338  BP: 134/81  Pulse: 90  Resp: 17  Temp: 36.8 C    Last Pain:  Vitals:   01/27/18 0338  TempSrc: Oral         Complications: No apparent anesthesia complications

## 2018-01-27 NOTE — Progress Notes (Signed)
Dr. Mora Appl notified that urine output was approximately 16.53ml per hour.  She ordered a 1L bolus of LR to be run in over an hour (at 965ml/hr)

## 2018-01-27 NOTE — MAU Note (Signed)
Has had to change her pad 3 times since leaving earlier for rule out rupture.  Woke up in a puddle of water earlier around 0230.  No ctx or VB.  Reports not feeling baby move since waking up but has felt him move since being in MAU.  Repeat csection 02/15/2018.

## 2018-01-28 ENCOUNTER — Encounter (HOSPITAL_COMMUNITY): Payer: Self-pay

## 2018-01-28 LAB — CBC
HCT: 21.3 % — ABNORMAL LOW (ref 36.0–46.0)
Hemoglobin: 7.4 g/dL — ABNORMAL LOW (ref 12.0–15.0)
MCH: 29.8 pg (ref 26.0–34.0)
MCHC: 34.7 g/dL (ref 30.0–36.0)
MCV: 85.9 fL (ref 78.0–100.0)
Platelets: 219 10*3/uL (ref 150–400)
RBC: 2.48 MIL/uL — ABNORMAL LOW (ref 3.87–5.11)
RDW: 13.6 % (ref 11.5–15.5)
WBC: 11.8 10*3/uL — AB (ref 4.0–10.5)

## 2018-01-28 MED ORDER — DOCUSATE SODIUM 100 MG PO CAPS: 100.0000 mg | ORAL_CAPSULE | Freq: Two times a day (BID) | ORAL | Status: DC | PRN

## 2018-01-28 MED ORDER — FERROUS SULFATE 325 (65 FE) MG PO TABS
325.0000 mg | ORAL_TABLET | Freq: Two times a day (BID) | ORAL | Status: DC
  Administered 2018-01-28 – 2018-01-30 (×4): 325 mg via ORAL
  Filled 2018-01-28 (×4): qty 1

## 2018-01-28 MED ORDER — IBUPROFEN 800 MG PO TABS
800.0000 mg | ORAL_TABLET | Freq: Three times a day (TID) | ORAL | Status: DC
  Administered 2018-01-28 – 2018-01-30 (×5): 800 mg via ORAL
  Filled 2018-01-28 (×5): qty 1

## 2018-01-28 NOTE — Progress Notes (Signed)
Post Partum Day 1 Subjective: no complaints.  She denies lightheadedness or dizziness.   Objective: Blood pressure (!) 124/56, pulse 81, temperature 98.2 F (36.8 C), temperature source Oral, resp. rate 18, last menstrual period 05/11/2017, SpO2 100 %, unknown if currently breastfeeding.  Physical Exam:  General: alert, cooperative and no distress Lochia: appropriate Uterine Fundus: firm Incision: healing well, no significant drainage DVT Evaluation: No evidence of DVT seen on physical exam.  Recent Labs    01/27/18 0420 01/28/18 0609  HGB 11.5* 7.4*  HCT 33.6* 21.3*    Assessment/Plan: POD # 1 s/p R C/S and BTL, stable, with anemia but no symptoms of anemia  -iron tabs for anemia -routine post op care. -for outpatient circumcision.   LOS: 1 day   Konrad Felix, MD.  01/28/2018, 4:39 PM

## 2018-01-28 NOTE — Lactation Note (Signed)
This note was copied from a baby's chart. Lactation Consultation Note  Patient Name: [name] VBTYO'M Date: 01/28/2018 Reason for consult: Initial assessment;Late-preterm 34-36.6wks;1st time breastfeeding Mom states she came in wanting to formula feed.  She did not breastfeed her previous babies.  She said since I was here she would like to try to put baby to breast.  Baby latched with a shallow latch and weak sucks.  Discussed late preterm behavior.  Mom willing to pump for stimulation. Symphony pump set up and initiated.  Instructed to attempt to latch when baby cues and post pump x 15 minutes.  Mom will continue to supplement baby with formula.  Encouraged to call for assist prn.  Maternal Data Has patient been taught Hand Expression?: Yes Does the patient have breastfeeding experience prior to this delivery?: No  Feeding Feeding Type: Breast Fed Nipple Type: Slow - flow  LATCH Score Latch: Repeated attempts needed to sustain latch, nipple held in mouth throughout feeding, stimulation needed to elicit sucking reflex.  Audible Swallowing: None  Type of Nipple: Everted at rest and after stimulation  Comfort (Breast/Nipple): Soft / non-tender  Hold (Positioning): Assistance needed to correctly position infant at breast and maintain latch.  LATCH Score: 6  Interventions    Lactation Tools Discussed/Used Pump Review: Setup, frequency, and cleaning;Milk Storage Initiated by:: LM Date initiated:: 01/28/18   Consult Status Consult Status: Follow-up Date: 01/29/18 Follow-up type: In-patient    Huston Foley 01/28/2018, 3:03 PM

## 2018-01-28 NOTE — Plan of Care (Signed)
  Pain Managment: General experience of comfort will improve 01/28/2018 1638 - Not Progressing by Osvaldo Human, RN  Pt reports pain in her lower abdomen that is a soreness around the incision site and in the area of the fundal massages that is a 6-8/10.  Pt currently taking Oxy IR 10mg  q4, Motrin 600mg  q6 and tylenol 650mg  q4.  Pt reports it helps some but she remains very uncomfortable.  Discussed with Dr. Sallye Ober, who gave a verbal order for the Motrin to be changed to 800mg  q8 to be started 6 hours after the last 600mg  dose of Motrin was given.  Order was placed.  Pt reports increase in pain that began after her nap where she slept on her side.  Instructed pt to use a pillow to prop her abdomen to keep it from pulling at the incision site which will increase her pain, or sleep on her side.  Pt also encouraged to increase ambulation.  Pt verbalized understanding of all teaching.  Will continue to monitor and assist with pain control.

## 2018-01-28 NOTE — Progress Notes (Signed)
Pt requested at 1230pm on 01/28/18 that staff remain out of her room until 1500 so that she could take a nap.  A sign was placed on pt's door

## 2018-01-29 ENCOUNTER — Encounter (HOSPITAL_COMMUNITY): Payer: Self-pay | Admitting: *Deleted

## 2018-01-30 MED ORDER — OXYCODONE HCL 5 MG PO TABS: 5.0000 mg | ORAL_TABLET | ORAL | 0 refills | Status: DC | PRN

## 2018-01-30 MED ORDER — IBUPROFEN 800 MG PO TABS: 800.0000 mg | ORAL_TABLET | Freq: Three times a day (TID) | ORAL | 0 refills | Status: AC

## 2018-01-30 NOTE — Discharge Summary (Signed)
    OB Discharge Summary     Patient Name: [name] DOB: January 04, 1991 MRN: [id number]  Date of admission: 01/27/2018 Delivering MD: Geryl Rankins   Date of discharge: 01/30/2018  Admitting diagnosis: WOKE UP IN PUDDLE NO FM Intrauterine pregnancy: [redacted]w[redacted]d     Secondary diagnosis:  Active Problems:   S/P repeat low transverse C-section  Additional problems: none     Discharge diagnosis: Term Pregnancy Delivered                                                                                                Post partum procedures:postpartum tubal ligation  Augmentation: None  Complications: None  Hospital course:  Onset of Labor With Unplanned C/S  27 y.o. yo [id number] at [redacted]w[redacted]d was admitted in Latent Labor on 01/27/2018. Patient had SROM and desired repeat LTCS. Membrane Rupture Time/Date: 2:30 AM ,01/27/2018   The patient went for cesarean section due to Elective Repeat, and delivered a Viable infant,01/27/2018  Details of operation can be found in separate operative note. Patient had an uncomplicated postpartum course.  She is ambulating,tolerating a regular diet, passing flatus, and urinating well.  Patient is discharged home in stable condition 01/30/18.  Physical exam  Vitals:   01/28/18 1726 01/29/18 0600 01/29/18 1737 01/30/18 0552  BP: 110/67 (!) 110/52 131/63 117/66  Pulse: 76 72 89 71  Resp: 18 18 18 18   Temp: 98 F (36.7 C) 97.9 F (36.6 C) 97.9 F (36.6 C) 98.7 F (37.1 C)  TempSrc: Oral Oral Oral Oral  SpO2:   100% 97%   General: alert, cooperative and no distress Lochia: appropriate Uterine Fundus: firm Incision: Dressing is clean, dry, and intact DVT Evaluation: No evidence of DVT seen on physical exam. Labs: Lab Results  Component Value Date   WBC 11.8 (H) 01/28/2018   HGB 7.4 (L) 01/28/2018   HCT 21.3 (L) 01/28/2018   MCV 85.9 01/28/2018   PLT 219 01/28/2018   CMP Latest Ref Rng & Units 09/12/2016  Glucose 65 - 99 mg/dL 83  BUN 6 - 20 mg/dL 10   Creatinine 1.47 - 1.00 mg/dL 8.29  Sodium 562 - 130 mmol/L 139  Potassium 3.5 - 5.1 mmol/L 3.6  Chloride 101 - 111 mmol/L 107  CO2 22 - 32 mmol/L 28  Calcium 8.9 - 10.3 mg/dL 9.4    Discharge instruction: per After Visit Summary and "Baby and Me Booklet".  After visit meds:  Allergies as of 01/30/2018   No Known Allergies        Diet: routine diet  Activity: Advance as tolerated. Pelvic rest for 6 weeks.   Outpatient follow up:6 weeks Follow up Appt:No future appointments. Follow up Visit:No follow-ups on file.  Postpartum contraception: Tubal Ligation  Newborn Data: Live born female  Birth Weight: 5 lb 11.5 oz (2595 g) APGAR: 8, 9  Newborn Delivery   Birth date/time:  01/27/2018 06:30:00 Delivery type:  C-Section, Low Vertical C-section categorization:  Repeat     Baby Feeding: Bottle Disposition:home with mother   01/30/2018 [name], CNM

## 2018-01-30 NOTE — Progress Notes (Signed)
Subjective: Postpartum Day 2: Cesarean Delivery with BTL Patient reports + flatus and no problems voiding.  Ambulating, wishes to stay til tomorrow  Objective: Vital signs in last 24 hours: Temp:  [97.9 F (36.6 C)-98.7 F (37.1 C)] 98.7 F (37.1 C) (03/21 0552) Pulse Rate:  [71-89] 71 (03/21 0552) Resp:  [18] 18 (03/21 0552) BP: (117-131)/(63-66) 117/66 (03/21 0552) SpO2:  [97 %-100 %] 97 % (03/21 0552)  Physical Exam:  General: alert, cooperative and no distress Lochia: appropriate Uterine Fundus: firm Incision: covered and dry DVT Evaluation: No evidence of DVT seen on physical exam. Negative Homan's sign.  Recent Labs    01/28/18 0609  HGB 7.4*  HCT 21.3*    Assessment/Plan: Status post Cesarean section. Doing well postoperatively.  Continue current care.  [name] 01/30/2018, 7:31 AM

## 2018-01-30 NOTE — Progress Notes (Signed)
Subjective: Postpartum Day 2: Cesarean Delivery Patient reports incisional pain, tolerating PO, + flatus, + BM and no problems voiding.    Objective: Vital signs in last 24 hours: Temp:  [97.9 F (36.6 C)-98.7 F (37.1 C)] 98.7 F (37.1 C) (03/21 0552) Pulse Rate:  [71-89] 71 (03/21 0552) Resp:  [18] 18 (03/21 0552) BP: (117-131)/(63-66) 117/66 (03/21 0552) SpO2:  [97 %-100 %] 97 % (03/21 0552)  Physical Exam:  General: alert, cooperative and no distress Lochia: appropriate Uterine Fundus: firm Incision: healing well, no significant drainage, no dehiscence DVT Evaluation: No evidence of DVT seen on physical exam. Negative Homan's sign. No cords or calf tenderness.  Recent Labs    01/28/18 0609  HGB 7.4*  HCT 21.3*    Assessment/Plan: Status post Cesarean section. Doing well postoperatively.  Discharge home with standard precautions and return to clinic in 4-6 weeks.  Essie Hart [name] 01/30/2018, 10:42 AM

## 2018-02-15 ENCOUNTER — Inpatient Hospital Stay (HOSPITAL_COMMUNITY): Admit: 2018-02-15 | Payer: MEDICAID | Admitting: Obstetrics and Gynecology

## 2020-01-10 ENCOUNTER — Emergency Department (HOSPITAL_COMMUNITY): Payer: Medicaid Other

## 2020-01-10 ENCOUNTER — Other Ambulatory Visit: Payer: Self-pay

## 2020-01-10 ENCOUNTER — Encounter (HOSPITAL_COMMUNITY): Payer: Self-pay | Admitting: Emergency Medicine

## 2020-01-10 ENCOUNTER — Emergency Department (HOSPITAL_COMMUNITY)
Admission: EM | Admit: 2020-01-10 | Discharge: 2020-01-10 | DRG: 060 | Disposition: A | Discharge status: Home / Self Care | Payer: Medicaid Other | Attending: Family Medicine | Admitting: Family Medicine

## 2020-01-10 DIAGNOSIS — G35 Multiple sclerosis: Secondary | ICD-10-CM | POA: Diagnosis not present

## 2020-01-10 DIAGNOSIS — Z5329 Procedure and treatment not carried out because of patient's decision for other reasons: Secondary | ICD-10-CM | POA: Diagnosis not present

## 2020-01-10 DIAGNOSIS — Z20822 Contact with and (suspected) exposure to covid-19: Secondary | ICD-10-CM | POA: Diagnosis present

## 2020-01-10 DIAGNOSIS — Z833 Family history of diabetes mellitus: Secondary | ICD-10-CM | POA: Diagnosis not present

## 2020-01-10 DIAGNOSIS — Z8249 Family history of ischemic heart disease and other diseases of the circulatory system: Secondary | ICD-10-CM

## 2020-01-10 DIAGNOSIS — F17210 Nicotine dependence, cigarettes, uncomplicated: Secondary | ICD-10-CM | POA: Diagnosis present

## 2020-01-10 DIAGNOSIS — R202 Paresthesia of skin: Secondary | ICD-10-CM | POA: Diagnosis present

## 2020-01-10 LAB — COMPREHENSIVE METABOLIC PANEL
ALT: 13 U/L (ref 0–44)
AST: 18 U/L (ref 15–41)
Albumin: 4.1 g/dL (ref 3.5–5.0)
Alkaline Phosphatase: 36 U/L — ABNORMAL LOW (ref 38–126)
Anion gap: 6 (ref 5–15)
BUN: 11 mg/dL (ref 6–20)
CO2: 24 mmol/L (ref 22–32)
Calcium: 9.1 mg/dL (ref 8.9–10.3)
Chloride: 109 mmol/L (ref 98–111)
Creatinine, Ser: 0.66 mg/dL (ref 0.44–1.00)
GFR calc Af Amer: >60 mL/min (ref >60)
GFR calc non Af Amer: >60 mL/min (ref >60)
Glucose, Bld: 98 mg/dL (ref 70–99)
Potassium: 3.6 mmol/L (ref 3.5–5.1)
Sodium: 139 mmol/L (ref 135–145)
Total Bilirubin: 0.3 mg/dL (ref 0.3–1.2)
Total Protein: 7.4 g/dL (ref 6.5–8.1)

## 2020-01-10 LAB — CREATININE, SERUM
Creatinine, Ser: 0.66 mg/dL (ref 0.44–1.00)
GFR calc Af Amer: >60 mL/min (ref >60)
GFR calc non Af Amer: >60 mL/min (ref >60)

## 2020-01-10 LAB — CBC WITH DIFFERENTIAL/PLATELET
Abs Immature Granulocytes: 0.01 10*3/uL (ref 0.00–0.07)
Basophils Absolute: 0 10*3/uL (ref 0.0–0.1)
Basophils Relative: 0 %
Eosinophils Absolute: 0 10*3/uL (ref 0.0–0.5)
Eosinophils Relative: 1 %
HCT: 34.9 % — ABNORMAL LOW (ref 36.0–46.0)
Hemoglobin: 11.2 g/dL — ABNORMAL LOW (ref 12.0–15.0)
Immature Granulocytes: 0 %
Lymphocytes Relative: 42 %
Lymphs Abs: 2.6 10*3/uL (ref 0.7–4.0)
MCH: 28.5 pg (ref 26.0–34.0)
MCHC: 32.1 g/dL (ref 30.0–36.0)
MCV: 88.8 fL (ref 80.0–100.0)
Monocytes Absolute: 0.3 10*3/uL (ref 0.1–1.0)
Monocytes Relative: 5 %
Neutro Abs: 3.2 10*3/uL (ref 1.7–7.7)
Neutrophils Relative %: 52 %
Platelets: 262 10*3/uL (ref 150–400)
RBC: 3.93 MIL/uL (ref 3.87–5.11)
RDW: 12.7 % (ref 11.5–15.5)
WBC: 6.1 10*3/uL (ref 4.0–10.5)
nRBC: 0 % (ref 0.0–0.2)

## 2020-01-10 LAB — CBC
HCT: 35.7 % — ABNORMAL LOW (ref 36.0–46.0)
Hemoglobin: 11.5 g/dL — ABNORMAL LOW (ref 12.0–15.0)
MCH: 29 pg (ref 26.0–34.0)
MCHC: 32.2 g/dL (ref 30.0–36.0)
MCV: 89.9 fL (ref 80.0–100.0)
Platelets: 257 10*3/uL (ref 150–400)
RBC: 3.97 MIL/uL (ref 3.87–5.11)
RDW: 12.8 % (ref 11.5–15.5)
WBC: 7.2 10*3/uL (ref 4.0–10.5)
nRBC: 0 % (ref 0.0–0.2)

## 2020-01-10 LAB — I-STAT BETA HCG BLOOD, ED (MC, WL, AP ONLY): I-stat hCG, quantitative: <5 m[IU]/mL (ref <5)

## 2020-01-10 MED ORDER — METHYLPREDNISOLONE SODIUM SUCC 1000 MG IJ SOLR
1000.0000 mg | Freq: Once | INTRAMUSCULAR | Status: DC
Start: 2020-01-10 — End: 2020-01-11
  Filled 2020-01-10: qty 8

## 2020-01-10 MED ORDER — GADOBUTROL 1 MMOL/ML IV SOLN
10.0000 mL | Freq: Once | INTRAVENOUS | Status: AC | PRN
  Administered 2020-01-10: 9 mL via INTRAVENOUS

## 2020-01-10 MED ORDER — ACETAMINOPHEN 650 MG RE SUPP: 650.0000 mg | Freq: Four times a day (QID) | RECTAL | Status: DC | PRN

## 2020-01-10 MED ORDER — SODIUM CHLORIDE 0.9% FLUSH: 3.0000 mL | Freq: Two times a day (BID) | INTRAVENOUS | Status: DC

## 2020-01-10 MED ORDER — ACETAMINOPHEN 325 MG PO TABS: 650.0000 mg | ORAL_TABLET | Freq: Four times a day (QID) | ORAL | Status: DC | PRN

## 2020-01-10 MED ORDER — POLYETHYLENE GLYCOL 3350 17 G PO PACK: 17.0000 g | PACK | Freq: Every day | ORAL | Status: DC | PRN

## 2020-01-10 MED ORDER — SODIUM CHLORIDE 0.9 % IV SOLN: 1000.0000 mg | Freq: Every day | INTRAVENOUS | Status: DC

## 2020-01-10 MED ORDER — SODIUM CHLORIDE 0.9% FLUSH: 3.0000 mL | INTRAVENOUS | Status: DC | PRN

## 2020-01-10 MED ORDER — ONDANSETRON HCL 4 MG/2ML IJ SOLN: 4.0000 mg | Freq: Four times a day (QID) | INTRAMUSCULAR | Status: DC | PRN

## 2020-01-10 MED ORDER — ONDANSETRON HCL 4 MG PO TABS: 4.0000 mg | ORAL_TABLET | Freq: Four times a day (QID) | ORAL | Status: DC | PRN

## 2020-01-10 MED ORDER — ENOXAPARIN SODIUM 40 MG/0.4ML ~~LOC~~ SOLN: 40.0000 mg | SUBCUTANEOUS | Status: DC

## 2020-01-10 MED ORDER — SODIUM CHLORIDE 0.9 % IV SOLN: 250.0000 mL | INTRAVENOUS | Status: DC | PRN

## 2020-01-10 NOTE — ED Notes (Signed)
Pt able to ambulate without assistance to the restroom 
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
## 2020-01-10 NOTE — H&P (Signed)
PCP:   Patient, No Pcp Per   Chief Complaint: None   HPI: This is a 29 year old female who presents with 2 complaints of left-sided weakness.  She has weakness in left arm, neck and trunk but not her face.  She denies any fever.   Review of Systems:  The patient denies anorexia, fever, weight loss,, vision loss, decreased hearing, hoarseness, chest pain, syncope, dyspnea on exertion, peripheral edema, balance deficits, hemoptysis, abdominal pain, melena, hematochezia, severe indigestion/heartburn, hematuria, incontinence, genital sores, muscle weakness, suspicious skin lesions, transient blindness, difficulty walking, depression, unusual weight change, abnormal bleeding, enlarged lymph nodes, angioedema, and breast masses.  Past Medical History: Past Medical History:  Diagnosis Date  . GERD (gastroesophageal reflux disease)    with pregnancy   . Headache    Past Surgical History:  Procedure Laterality Date  . ADENOIDECTOMY    . CESAREAN SECTION    . CESAREAN SECTION N/A 01/16/2016   Procedure: CESAREAN SECTION;  Surgeon: Richarda Overlie, MD;  Location: [facility];  Service: Obstetrics;  Laterality: N/A;  Repeat edc 01/23/16 NKDA  . CESAREAN SECTION WITH BILATERAL TUBAL LIGATION Bilateral 01/27/2018   Procedure: REPEAT CESAREAN SECTION WITH BILATERAL TUBAL LIGATION;  Surgeon: Osborn Coho, MD;  [facility];  Service: Obstetrics;  Laterality: Bilateral;  . TONSILLECTOMY      Medications: Prior to Admission medications   Medication Sig Start Date End Date Taking? Authorizing Provider  acetaminophen (TYLENOL) 500 MG tablet Take 1,000 mg by mouth every 6 (six) hours as needed for mild pain or headache.    Yes [provider]  ibuprofen (ADVIL,MOTRIN) 800 MG tablet Take 1 tablet (800 mg total) by mouth every 8 (eight) hours. 01/30/18   Prothero, Henderson Newcomer, CNM  oxyCODONE (OXY IR/ROXICODONE) 5 MG immediate release tablet Take 1 tablet (5 mg total) by mouth every 4  (four) hours as needed (pain scale 4-7). 01/30/18   Prothero, Henderson Newcomer, CNM    Allergies:  No Known Allergies  Social History:  reports that she has been smoking cigarettes. She has been smoking about 0.25 packs per day. She has never used smokeless tobacco. She reports current alcohol use. She reports that she does not use drugs.  Family History: Family History  Problem Relation Age of Onset  . Hypertension Mother   . Diabetes Maternal Grandmother     Physical Exam: Vitals:   01/10/20 1542 01/10/20 1933  BP: 136/79 128/77  Pulse: 92 65  Resp: 18 16  Temp: 98 F (36.7 C)   TempSrc: Oral   SpO2: 99% 100%    General:  Alert and oriented times three, well developed and nourished, no acute distress Eyes: PERRLA, pink conjunctiva, no scleral icterus ENT: Moist oral mucosa, neck supple, no thyromegaly Lungs: clear to ascultation, no wheeze, no crackles, no use of accessory muscles Cardiovascular: regular rate and rhythm, no regurgitation, no gallops, no murmurs. No carotid bruits, no JVD Abdomen: soft, positive BS, non-tender, non-distended, no organomegaly, not an acute abdomen GU: not examined Neuro: CN II - XII grossly intact, sensation intact Musculoskeletal: strength 5/5 all extremities, no clubbing, cyanosis or edema Skin: no rash, no subcutaneous crepitation, no decubitus Psych: appropriate patient   Labs on Admission:  Recent Labs    01/10/20 1607  NA 139  K 3.6  CL 109  CO2 24  GLUCOSE 98  BUN 11  CREATININE 0.66  CALCIUM 9.1   Recent Labs    01/10/20 1607  AST 18  ALT 13  ALKPHOS 36*  BILITOT 0.3  PROT 7.4  ALBUMIN 4.1   No results for input(s): LIPASE, AMYLASE in the last 72 hours. Recent Labs    01/10/20 1607  WBC 6.1  NEUTROABS 3.2  HGB 11.2*  HCT 34.9*  MCV 88.8  PLT 262   No results for input(s): CKTOTAL, CKMB, CKMBINDEX, TROPONINI in the last 72 hours. Invalid input(s): POCBNP No results for input(s): DDIMER in the last 72  hours. No results for input(s): HGBA1C in the last 72 hours. No results for input(s): CHOL, HDL, LDLCALC, TRIG, CHOLHDL, LDLDIRECT in the last 72 hours. No results for input(s): TSH, T4TOTAL, T3FREE, THYROIDAB in the last 72 hours.  Invalid input(s): FREET3 No results for input(s): VITAMINB12, FOLATE, FERRITIN, TIBC, IRON, RETICCTPCT in the last 72 hours.  Micro Results: No results found for this or any previous visit (from the past 240 hour(s)).   Radiological Exams on Admission: MR Brain W and Wo Contrast  Result Date: 01/10/2020 CLINICAL DATA:  Numbness and tingling of the left side of the body. EXAM: MRI HEAD WITHOUT AND WITH CONTRAST TECHNIQUE: Multiplanar, multiecho pulse sequences of the brain and surrounding structures were obtained without and with intravenous contrast. CONTRAST:  53mL GADAVIST GADOBUTROL 1 MMOL/ML IV SOLN COMPARISON:  None. FINDINGS: Brain: Diffusion imaging does not show any acute or subacute infarction. No abnormality is seen affecting the brainstem or cerebellum. Cerebral hemispheres show a 12 mm focus of abnormal T2 and FLAIR signal within the white matter of the left medial parietal lobe as well as 2 other foci of T2 and FLAIR signal affecting the subcortical white matter at the left frontoparietal vertex. Given the absence of restricted diffusion, these are unlikely to represent acute or subacute infarctions and more likely represent a form of demyelinating disease/multiple sclerosis. No evidence of swelling or hemorrhage. No hydrocephalus or extra-axial collection. After contrast administration, there is low level enhancement in the subcortical white matter lesion of the medial left parietal lobe. Vascular: Major vessels at the base of the brain show flow. Skull and upper cervical spine: Negative Sinuses/Orbits: Clear/normal Other: None IMPRESSION: Three foci of subcortical white matter pathology in the left medial parietal lobe and at the left frontoparietal vertex.  No restricted diffusion. Contrast enhancement the medial parietal lesion. In a person of this age, without evidence of acute or subacute infarction by restricted diffusion, these most likely represent demyelinating lesions/multiple sclerosis. Electronically Signed   By: Paulina Fusi M.D.   On: 01/10/2020 20:21   MR Cervical Spine W or Wo Contrast  Addendum Date: 01/10/2020   ADDENDUM REPORT: 01/10/2020 20:24 ADDENDUM: Dictation system error. No report was generated on the study previously. This addendum will serve as the initial and complete report. There is abnormal T2 and FLAIR signal with contrast enhancement within the right side of the cord in the region from inferior C2 to inferior C3. This is consistent with a demyelinating lesion. No other cervical region abnormal cord signal. Alignment is normal. No degenerative disc disease. No canal or foraminal stenosis. No osseous or articular pathology. IMPRESSION: IMPRESSION Abnormal lesion within the right side of the cervical cord from inferior C2 to inferior C3 with abnormal T2 signal and contrast enhancement, consistent with demyelinating disease/multiple sclerosis. Electronically Signed   By: Paulina Fusi M.D.   On: 01/10/2020 20:24   Result Date: 01/10/2020 EXAM: MRI CERVICAL SPINE WITHOUT AND WITH CONTRAST TECHNIQUE: Multiplanar and multiecho pulse sequences of the cervical spine, to include the craniocervical junction and cervicothoracic junction,  were obtained without and with intravenous contrast. CONTRAST:  6mL GADAVIST GADOBUTROL 1 MMOL/ML IV SOLN COMPARISON:  None. FINDINGS: Alignment: Vertebrae: Cord: Posterior Fossa, vertebral arteries, paraspinal tissues: Disc levels: Electronically Signed: By: Nelson Chimes M.D. On: 01/10/2020 19:25    Assessment/Plan Present on Admission: . Multiple sclerosis ([place]) -Admit to MedSurg -IV Solu-Medrol 1 g IV daily for 5 days -Neuro consult is Dr. Cheral Marker aware.  Case discussed with ER physician -PT consult  placed  Patient left AMA  Kaicee Scarpino 01/10/2020, 9:07 PM

## 2020-01-10 NOTE — ED Provider Notes (Addendum)
[facility]-EMERGENCY DEPT Provider Note   CSN: [id number] Arrival date & time: 01/10/20  1535     History Chief Complaint  Patient presents with  . left side tingling    from shoulder down to toes    [name] is a 29 y.o. female.  Patient presents with a 4-day history of pins and needle sensation with numbness and tingling involving her left side of her body.  States that all came in at 1 time and has been constant for the past 4 days.  She believes she is having some weakness in her left arm as well.  She denies any weakness or numbness of her face.  There is no difficulty breathing or difficulty swallowing or speaking.  She denies any headache, chills, fever, vision changes, chest pain or shortness of breath.  She states she came in today because it is the first day she has time. Denies any chronic medical conditions or any history of neurological issues in herself or her family. She states she feels different sensation is all to her left arm, left trunk, left leg and some weakness in her left arm.  The history is provided by the patient.       Past Medical History:  Diagnosis Date  . GERD (gastroesophageal reflux disease)    with pregnancy   . Headache     Patient Active Problem List   Diagnosis Date Noted  . S/P repeat low transverse C-section 01/27/2018  . Previous cesarean section 01/26/2018  . 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 Complete miscarriage 08/20/2014    Past Surgical History:  Procedure Laterality Date  . ADENOIDECTOMY    . CESAREAN SECTION    . CESAREAN SECTION N/A 01/16/2016   Procedure: CESAREAN SECTION;  Surgeon: Richarda Overlie, MD;  Location: [facility] ORS;  Service: Obstetrics;  Laterality: N/A;  Repeat edc 01/23/16 NKDA  . CESAREAN SECTION WITH BILATERAL TUBAL LIGATION Bilateral 01/27/2018   Procedure: REPEAT CESAREAN SECTION WITH BILATERAL TUBAL LIGATION;  Surgeon: Osborn Coho, MD;  Location: [facility] BIRTHING SUITES;   Service: Obstetrics;  Laterality: Bilateral;  . TONSILLECTOMY       OB History    Gravida  4   Para  3   Term  2   Preterm  1   AB  1   Living  3     SAB      TAB  1   Ectopic      Multiple  0   Live Births  3           Family History  Problem Relation Age of Onset  . Hypertension Mother   . Diabetes Maternal Grandmother     Social History   Tobacco Use  . Smoking status: Current Every Day Smoker    Packs/day: 0.25    Types: Cigarettes  . Smokeless tobacco: Never Used  Substance Use Topics  . Alcohol use: Yes    Comment: occasional - not during pregnancy  . Drug use: No    Home Medications Prior to Admission medications   Medication Sig Start Date End Date Taking? Authorizing Provider  acetaminophen (TYLENOL) 500 MG tablet Take 1,000 mg by mouth every 6 (six) hours as needed for mild pain or headache.     [provider]  ibuprofen (ADVIL,MOTRIN) 800 MG tablet Take 1 tablet (800 mg total) by mouth every 8 (eight) hours. 01/30/18   Prothero, Henderson Newcomer, CNM  oxyCODONE (OXY IR/ROXICODONE) 5 MG immediate release tablet  Take 1 tablet (5 mg total) by mouth every 4 (four) hours as needed (pain scale 4-7). 01/30/18   Prothero, Henderson Newcomer, CNM  Prenatal MV-Min-FA-Omega-3 (PRENATAL GUMMIES/DHA & FA PO) Take 2 capsules by mouth daily.    [provider]    Allergies    Patient has no known allergies.  Review of Systems   Review of Systems  Constitutional: Negative for activity change, appetite change and fever.  HENT: Negative for congestion and rhinorrhea.   Eyes: Negative for visual disturbance.  Respiratory: Negative for chest tightness and shortness of breath.   Cardiovascular: Negative for chest pain.  Gastrointestinal: Negative for abdominal pain, nausea and vomiting.  Genitourinary: Negative for dysuria and hematuria.  Musculoskeletal: Negative for arthralgias and myalgias.  Skin: Negative for rash.  Neurological: Positive for  weakness and numbness. Negative for light-headedness and headaches.   all other systems are negative except as noted in the HPI and PMH.    Physical Exam Updated Vital Signs BP 136/79   Pulse 92   Temp 98 F (36.7 C) (Oral)   Resp 18   LMP 12/21/2019   SpO2 99%   Physical Exam Vitals and nursing note reviewed.  Constitutional:      General: She is not in acute distress.    Appearance: She is well-developed.  HENT:     Head: Normocephalic and atraumatic.     Mouth/Throat:     Pharynx: No oropharyngeal exudate.  Eyes:     Conjunctiva/sclera: Conjunctivae normal.     Pupils: Pupils are equal, round, and reactive to light.  Neck:     Comments: No meningismus. Cardiovascular:     Rate and Rhythm: Normal rate and regular rhythm.     Heart sounds: Normal heart sounds. No murmur.  Pulmonary:     Effort: Pulmonary effort is normal. No respiratory distress.     Breath sounds: Normal breath sounds.  Abdominal:     Palpations: Abdomen is soft.     Tenderness: There is no abdominal tenderness. There is no guarding or rebound.  Musculoskeletal:        General: No tenderness. Normal range of motion.     Cervical back: Normal range of motion and neck supple.  Skin:    General: Skin is warm.  Neurological:     Mental Status: She is alert and oriented to person, place, and time.     Cranial Nerves: No cranial nerve deficit.     Sensory: Sensory deficit present.     Motor: Weakness present. No abnormal muscle tone.     Coordination: Coordination normal.     Comments:  5/5 strength throughout. CN 2-12 intact.Equal grip strength.  Subjective paresthesias involving left arm, left trunk and left leg.  Perhaps mild decreased grip on the left Otherwise 5 out of 5 strength throughout, no infection finger-to-nose, no nystagmus.  Normal gait negative Romberg  Psychiatric:        Behavior: Behavior normal.     ED Results / Procedures / Treatments   Labs (all labs ordered are listed, but  only abnormal results are displayed) Labs Reviewed  CBC WITH DIFFERENTIAL/PLATELET - Abnormal; Notable for the following components:      Result Value   Hemoglobin 11.2 (*)    HCT 34.9 (*)    All other components within normal limits  COMPREHENSIVE METABOLIC PANEL - Abnormal; Notable for the following components:   Alkaline Phosphatase 36 (*)    All other components within normal limits  SARS  CORONAVIRUS 2 (TAT 6-24 HRS)  I-STAT BETA HCG BLOOD, ED ([facility], [facility], [facility] ONLY)    EKG None  Radiology MR Brain W and Wo Contrast  Result Date: 01/10/2020 CLINICAL DATA:  Numbness and tingling of the left side of the body. EXAM: MRI HEAD WITHOUT AND WITH CONTRAST TECHNIQUE: Multiplanar, multiecho pulse sequences of the brain and surrounding structures were obtained without and with intravenous contrast. CONTRAST:  81mL GADAVIST GADOBUTROL 1 MMOL/ML IV SOLN COMPARISON:  None. FINDINGS: Brain: Diffusion imaging does not show any acute or subacute infarction. No abnormality is seen affecting the brainstem or cerebellum. Cerebral hemispheres show a 12 mm focus of abnormal T2 and FLAIR signal within the white matter of the left medial parietal lobe as well as 2 other foci of T2 and FLAIR signal affecting the subcortical white matter at the left frontoparietal vertex. Given the absence of restricted diffusion, these are unlikely to represent acute or subacute infarctions and more likely represent a form of demyelinating disease/multiple sclerosis. No evidence of swelling or hemorrhage. No hydrocephalus or extra-axial collection. After contrast administration, there is low level enhancement in the subcortical white matter lesion of the medial left parietal lobe. Vascular: Major vessels at the base of the brain show flow. Skull and upper cervical spine: Negative Sinuses/Orbits: Clear/normal Other: None IMPRESSION: Three foci of subcortical white matter pathology in the left medial parietal lobe and at the left  frontoparietal vertex. No restricted diffusion. Contrast enhancement the medial parietal lesion. In a person of this age, without evidence of acute or subacute infarction by restricted diffusion, these most likely represent demyelinating lesions/multiple sclerosis. Electronically Signed   By: Paulina Fusi M.D.   On: 01/10/2020 20:21   MR Cervical Spine W or Wo Contrast  Addendum Date: 01/10/2020   ADDENDUM REPORT: 01/10/2020 20:24 ADDENDUM: Dictation system error. No report was generated on the study previously. This addendum will serve as the initial and complete report. There is abnormal T2 and FLAIR signal with contrast enhancement within the right side of the cord in the region from inferior C2 to inferior C3. This is consistent with a demyelinating lesion. No other cervical region abnormal cord signal. Alignment is normal. No degenerative disc disease. No canal or foraminal stenosis. No osseous or articular pathology. IMPRESSION: IMPRESSION Abnormal lesion within the right side of the cervical cord from inferior C2 to inferior C3 with abnormal T2 signal and contrast enhancement, consistent with demyelinating disease/multiple sclerosis. Electronically Signed   By: Paulina Fusi M.D.   On: 01/10/2020 20:24   Result Date: 01/10/2020 EXAM: MRI CERVICAL SPINE WITHOUT AND WITH CONTRAST TECHNIQUE: Multiplanar and multiecho pulse sequences of the cervical spine, to include the craniocervical junction and cervicothoracic junction, were obtained without and with intravenous contrast. CONTRAST:  17mL GADAVIST GADOBUTROL 1 MMOL/ML IV SOLN COMPARISON:  None. FINDINGS: Alignment: Vertebrae: Cord: Posterior Fossa, vertebral arteries, paraspinal tissues: Disc levels: Electronically Signed: By: Paulina Fusi M.D. On: 01/10/2020 19:25    Procedures Procedures (including critical care time)  Medications Ordered in ED Medications - No data to display  ED Course  I have reviewed the triage vital signs and the nursing  notes.  Pertinent labs & imaging results that were available during my care of the patient were reviewed by me and considered in my medical decision making (see chart for details).    MDM Rules/Calculators/A&P                     4 days of left-sided  paresthesias with subjective arm and weakness.  Concern for possible underlying MS.  Will obtain MRI brain and C-spine.  Discussed with Dr. Rory Percy.  Dr. Rory Percy agrees with MRI brain and C spine with and without contrast.   MRI as above shows multiple enhancing lesions in her brain and C-spine.  This is concerning for demyelinating disease.  Discussed with Dr. Cheral Marker of neurology.  He will consult.  Agrees with Solu-Medrol 1000 mg daily x5 days.  Patient updated on findings.  Admission discussed with Dr. Claria Dice.  Addendum:, Patient discussed with Dr. Claria Dice that she does not want to stay in the hospital and wants to leave [place].  She states she wants a second opinion and she has childcare issues.  Discussed that she would leave [facility].  Discussed with Dr. Cheral Marker who does not recommend any p.o. steroids because of the high dosage and have significant risk for side effects such as hyperglycemia and need monitoring. He recommends that she stay in the hospital as well. Patient refuses to be admitted.  She understands she will leave [place] and is high risk for deterioration.  She appears to have capacity to make these decisions.  Final Clinical Impression(s) / ED Diagnoses Final diagnoses:  Multiple sclerosis [facility])    Rx / [place] Orders ED Discharge Orders    None       Raizel Wesolowski, Annie Main, MD 01/10/20 2118    Ezequiel Essex, MD 01/10/20 2145

## 2020-01-10 NOTE — Discharge Instructions (Addendum)
You are leaving AGAINST MEDICAL ADVICE.  Your work-up was concerning for multiple sclerosis.  It is recommended you come in the hospital for steroids.  This condition could get worse and cause permanent neurological damage. Return to the ED if you change your mind about staying in the hospital for treatment.

## 2020-01-10 NOTE — ED Triage Notes (Signed)
Pt reports that for 4 days on her left side of her body been having tingling sensations from left shoulder down to her toes. Denies any in her face.

## 2020-01-10 NOTE — Plan of Care (Signed)
Called by Dr. Lajean Saver about the patient with 4 days of left arm and leg pins-and-needles sensation without any worsening or improvement. Suspicion for possible underlying demyelinating disease. Check MRI brain with and without contrast, MRI C-spine with and without contrast. Please call neurology if we can be of any assistance if the results are abnormal.  -- Milon Dikes, MD [facility] Pager: [phone] If 7pm to 7am, please call on call as listed on AMION.

## 2020-01-11 LAB — HIV ANTIBODY (ROUTINE TESTING W REFLEX): HIV Screen 4th Generation wRfx: NONREACTIVE

## 2020-01-11 LAB — SARS CORONAVIRUS 2 (TAT 6-24 HRS): SARS Coronavirus 2: NEGATIVE

## 2020-01-12 ENCOUNTER — Encounter: Payer: Self-pay | Admitting: Neurology

## 2020-01-20 NOTE — Progress Notes (Signed)
NEUROLOGY CONSULTATION NOTE  [name] MRN: [id number] DOB: Jan 07, 1991  Referring provider: Ezequiel Essex, MD (ED referral) Primary care provider: No PCP  Reason for consult:  Multiple sclerosis  HISTORY OF PRESENT ILLNESS: [name] is a 29 year old right-handed black woman who presents for newly-diagnosed multiple sclerosis.  History supplemented by ED note.  MRI of brain and cervical spine personally reviewed.  She presented to [facility] ED on 01/10/2020 for evaluation of numbness and tingling of the left side of her body involving arm, torso and leg but not her face.  She also endorsed some mild left arm weakness as well.  Symptoms started 4 days prior to evaluation. MRI of brain and cervical spine with and without contrast showed a 12 mm hyperintense T2/FLAIR focus within the left medial parietal lobe as well as two other foci within the frontoparietal vertex subcortical white matter.  Contrast enhancement noted in the medial parietal lesion.  An enhancing lesion was also noted within the right side of the cervical cord at the C2-C3 levels.  Hospital admission for acute treatment was recommended but patient left against medical advice.  Since then, she has developed intermittent blurred vision and pressure behind her eyes.  Sometimes her right foot feels numb.  She has pain in her left hand, which makes lifting objects difficult.  On occasion, she reports word-finding difficulty.  In retrospect, no identifiable neurologic event that stands out. No family history of MS.  01/10/2020 LABS:  CBC with WBC 6.1, HGB 11.2, HCT 34.9, PLT 262, ALC 2.6; CMP with Na 139, K 3.6, Cl 109, CO2 24, glucose 98, BUN 11, Cr 0.66, t bili 0.3, ALP 36, AST 18, ALT 13; HIV screen nonreactive  PAST MEDICAL HISTORY: Past Medical History:  Diagnosis Date  . GERD (gastroesophageal reflux disease)    with pregnancy   . Headache     PAST SURGICAL HISTORY: Past Surgical History:  Procedure  Laterality Date  . ADENOIDECTOMY    . CESAREAN SECTION    . CESAREAN SECTION N/A 01/16/2016   Procedure: CESAREAN SECTION;  Surgeon: Molli Posey, MD;  Location: [facility];  Service: Obstetrics;  Laterality: N/A;  Repeat edc 01/23/16 NKDA  . CESAREAN SECTION WITH BILATERAL TUBAL LIGATION Bilateral 01/27/2018   Procedure: REPEAT CESAREAN SECTION WITH BILATERAL TUBAL LIGATION;  Surgeon: Everett Graff, MD;  Location: [facility];  Service: Obstetrics;  Laterality: Bilateral;  . TONSILLECTOMY      MEDICATIONS: Current Outpatient Medications on File Prior to Visit  Medication Sig Dispense Refill  . acetaminophen (TYLENOL) 500 MG tablet Take 1,000 mg by mouth every 6 (six) hours as needed for mild pain or headache.     . ibuprofen (ADVIL,MOTRIN) 800 MG tablet Take 1 tablet (800 mg total) by mouth every 8 (eight) hours. 30 tablet 0  . oxyCODONE (OXY IR/ROXICODONE) 5 MG immediate release tablet Take 1 tablet (5 mg total) by mouth every 4 (four) hours as needed (pain scale 4-7). 30 tablet 0   No current facility-administered medications on file prior to visit.    ALLERGIES: No Known Allergies  FAMILY HISTORY: Family History  Problem Relation Age of Onset  . Hypertension Mother   . Diabetes Maternal Grandmother     SOCIAL HISTORY: Social History   Socioeconomic History  . Marital status: Single    Spouse name: Not on file  . Number of children: Not on file  . Years of education: Not on file  . Highest education level:  Not on file  Occupational History  . Not on file  Tobacco Use  . Smoking status: Current Every Day Smoker    Packs/day: 0.25    Types: Cigarettes  . Smokeless tobacco: Never Used  Substance and Sexual Activity  . Alcohol use: Yes    Comment: occasional - not during pregnancy  . Drug use: No  . Sexual activity: Yes    Birth control/protection: None  Other Topics Concern  . Not on file  Social History Narrative  . Not on file   Social Determinants of  Health   Financial Resource Strain:   . Difficulty of Paying Living Expenses: Not on file  Food Insecurity:   . Worried About Programme researcher, broadcasting/film/video in the Last Year: Not on file  . Ran Out of Food in the Last Year: Not on file  Transportation Needs:   . Lack of Transportation (Medical): Not on file  . Lack of Transportation (Non-Medical): Not on file  Physical Activity:   . Days of Exercise per Week: Not on file  . Minutes of Exercise per Session: Not on file  Stress:   . Feeling of Stress : Not on file  Social Connections:   . Frequency of Communication with Friends and Family: Not on file  . Frequency of Social Gatherings with Friends and Family: Not on file  . Attends Religious Services: Not on file  . Active Member of Clubs or Organizations: Not on file  . Attends Banker Meetings: Not on file  . Marital Status: Not on file  Intimate Partner Violence:   . Fear of Current or Ex-Partner: Not on file  . Emotionally Abused: Not on file  . Physically Abused: Not on file  . Sexually Abused: Not on file    REVIEW OF SYSTEMS: Constitutional: No fevers, chills, or sweats, no generalized fatigue, change in appetite Eyes: No visual changes, double vision, eye pain Ear, nose and throat: No hearing loss, ear pain, nasal congestion, sore throat Cardiovascular: No chest pain, palpitations Respiratory:  No shortness of breath at rest or with exertion, wheezes GastrointestinaI: No nausea, vomiting, diarrhea, abdominal pain, fecal incontinence Genitourinary:  No dysuria, urinary retention or frequency Musculoskeletal:  No neck pain, back pain Integumentary: No rash, pruritus, skin lesions Neurological: as above Psychiatric: No depression, insomnia, anxiety Endocrine: No palpitations, fatigue, diaphoresis, mood swings, change in appetite, change in weight, increased thirst Hematologic/Lymphatic:  No purpura, petechiae. Allergic/Immunologic: no itchy/runny eyes, nasal  congestion, recent allergic reactions, rashes  PHYSICAL EXAM: Blood pressure (!) 144/84, pulse 92, resp. rate 18, height 5\' 3"  (1.6 m), weight 150 lb (68 kg), SpO2 100 %, unknown if currently breastfeeding. General: No acute distress.  Patient appears well-groomed.  Head:  Normocephalic/atraumatic Eyes:  fundi examined but not visualized Neck: supple, no paraspinal tenderness, full range of motion Back: No paraspinal tenderness Heart: regular rate and rhythm Lungs: Clear to auscultation bilaterally. Vascular: No carotid bruits. Neurological Exam: Mental status: alert and oriented to person, place, and time, recent and remote memory intact, fund of knowledge intact, attention and concentration intact, speech fluent and not dysarthric, language intact. Cranial nerves: CN I: not tested CN II: pupils equal, round and reactive to light, visual fields intact CN III, IV, VI:  full range of motion, no nystagmus, no ptosis CN V: facial sensation intact CN VII: upper and lower face symmetric CN VIII: hearing intact CN IX, X: gag intact, uvula midline CN XI: sternocleidomastoid and trapezius muscles intact CN XII:  tongue midline Bulk & Tone: normal, no fasciculations. Motor:  5/5 throughout Sensation:  Pinprick sensation reduced in left hand and right foot; vibration sensation intact. . Deep Tendon Reflexes:  2+ throughout, toes downgoing.  Finger to nose testing:  Without dysmetria.   Heel to shin:  Without dysmetria.   Gait:  Normal station and stride.  Able to turn and tandem walk. Romberg negative.  IMPRESSION: 1.  Multiple sclerosis.  As lesions are seen in both brain and spinal cord, I don't think we need to proceed with lumbar puncture.  Given the acute cervical cord lesion on initial diagnosis, would recommend Tysabri (or Ocrevus if with positive JC Virus and high index).  PLAN: 1.  Due to ongoing symptoms, including new visual disturbance which may be optic neuritis, will set up for  IV Solu-Medrol 1000mg  daily for 3 days. 2.  We will check labs:  TB quantiferon gold, hepatitis B panel, NMO antibody (given the cord lesion and possible bilateral optic neuritis), JC Virus with index, and vitamin D level. 3.  Refer to ophthalmology 4.  Follow up in 6 months.  Thank you for allowing me to take part in the care of this patient.  , DO

## 2020-01-21 ENCOUNTER — Other Ambulatory Visit: Payer: Medicaid Other

## 2020-01-21 ENCOUNTER — Other Ambulatory Visit: Payer: Self-pay

## 2020-01-21 ENCOUNTER — Encounter: Payer: Self-pay | Admitting: Neurology

## 2020-01-21 ENCOUNTER — Ambulatory Visit (INDEPENDENT_AMBULATORY_CARE_PROVIDER_SITE_OTHER): Payer: Medicaid Other | Admitting: Neurology

## 2020-01-21 VITALS — BP 144/84 | HR 92 | Resp 18 | Ht 63.0 in | Wt 150.0 lb

## 2020-01-21 DIAGNOSIS — G35 Multiple sclerosis: Secondary | ICD-10-CM | POA: Diagnosis not present

## 2020-01-21 NOTE — Patient Instructions (Addendum)
1.  I will set you up for IV steroids (Solu-Medrol) 1000mg  daily for 3 days. 2.  We will check labs:  Check TB, Hepatitis B panel, NMO antibody, JC Virus antibody with index, vitamin D level 3.  Plan would be to start either Tysabri or Ocrevus 4.  Refer to ophthalmology 5.  Follow up in 6 months. 6.  Please refer to the nationalmssociety.org   Multiple Sclerosis Multiple sclerosis (MS) is a disease of the brain, spinal cord, and optic nerves (central nervous system). It causes the body's disease-fighting (immune) system to destroy the protective covering (myelin sheath) around nerves in the brain. When this happens, signals (nerve impulses) going to and from the brain and spinal cord do not get sent properly or may not get sent at all. There are several types of MS:  Relapsing-remitting MS. This is the most common type. This causes sudden attacks of symptoms. After an attack, you may recover completely until the next attack, or some symptoms may remain permanently.  Secondary progressive MS. This usually develops after the onset of relapsing-remitting MS. Similar to relapsing-remitting MS, this type also causes sudden attacks of symptoms. Attacks may be less frequent, but symptoms slowly get worse (progress) over time.  Primary progressive MS. This causes symptoms that steadily progress over time. This type of MS does not cause sudden attacks of symptoms. The age of onset of MS varies, but it often develops between 68-63 years of age. MS is a lifelong (chronic) condition. There is no cure, but treatment can help slow down the progression of the disease. What are the causes? The cause of this condition is not known. What increases the risk? You are more likely to develop this condition if:  You are a woman.  You have a relative with MS. However, the condition is not passed from parent to child (inherited).  You have a lack (deficiency) of vitamin D.  You smoke. MS is more common in the  [place] than in the [place]. What are the signs or symptoms? Relapsing-remitting and secondary progressive MS cause symptoms to occur in episodes or attacks that may last weeks to months. There may be long periods between attacks in which there are almost no symptoms. Primary progressive MS causes symptoms to steadily progress after they develop. Symptoms of MS vary because of the many different ways it affects the central nervous system. The main symptoms include:  Vision problems and eye pain.  Numbness.  Weakness.  Inability to move your arms, hands, feet, or legs (paralysis).  Balance problems.  Shaking that you cannot control (tremors).  Muscle spasms.  Problems with thinking (cognitive changes). MS can also cause symptoms that are associated with the disease, but are not always the direct result of an MS attack. They may include:  Inability to control urination or bowel movements (incontinence).  Headaches.  Fatigue.  Inability to tolerate heat.  Emotional changes.  Depression.  Pain. How is this diagnosed? This condition is diagnosed based on:  Your symptoms.  A neurological exam. This involves checking central nervous system function, such as nerve function, reflexes, and coordination.  MRIs of the brain and spinal cord.  Lab tests, including a lumbar puncture that tests the fluid that surrounds the brain and spinal cord (cerebrospinal fluid).  Tests to measure the electrical activity of the brain in response to stimulation (evoked potentials). How is this treated? There is no cure for MS, but medicines can help decrease the number and  frequency of attacks and help relieve nuisance symptoms. Treatment options may include:  Medicines that reduce the frequency of attacks. These medicines may be given by injection, by mouth (orally), or through an IV.  Medicines that reduce inflammation (steroids). These may provide short-term  relief of symptoms.  Medicines to help control pain, depression, fatigue, or incontinence.  Vitamin D, if you have a deficiency.  Using devices to help you move around (assistive devices), such as braces, a cane, or a walker.  Physical therapy to strengthen and stretch your muscles.  Occupational therapy to help you with everyday tasks.  Alternative or complementary treatments such as exercise, massage, or acupuncture. Follow these instructions at home:  Take over-the-counter and prescription medicines only as told by your health care provider.  Do not drive or use heavy machinery while taking prescription pain medicine.  Use assistive devices as recommended by your physical therapist or your health care provider.  Exercise as directed by your health care provider.  Return to your normal activities as told by your health care provider. Ask your health care provider what activities are safe for you.  Reach out for support. Share your feelings with friends, family, or a support group.  Keep all follow-up visits as told by your health care provider and therapists. This is important. Where to find more information  National Multiple Sclerosis Society: https://www.nationalmssociety.org Contact a health care provider if:  You feel depressed.  You develop new pain or numbness.  You have tremors.  You have problems with sexual function. Get help right away if:  You develop paralysis.  You develop numbness.  You have problems with your bladder or bowel function.  You develop double vision.  You lose vision in one or both eyes.  You develop suicidal thoughts.  You develop severe confusion. If you ever feel like you may hurt yourself or others, or have thoughts about taking your own life, get help right away. You can go to your nearest emergency department or call:  Your local emergency services (911 in the [place]  A suicide crisis helpline, such as the National  Suicide Prevention Lifeline at 602-651-0275. This is open 24 hours a day. Summary  Multiple sclerosis (MS) is a disease of the central nervous system that causes the body's immune system to destroy the protective covering (myelin sheath) around nerves in the brain.  There are 3 types of MS: relapsing-remitting, secondary progressive, and primary progressive. Relapsing-remitting and secondary progressive MS cause symptoms to occur in episodes or attacks that may last weeks to months. Primary progressive MS causes symptoms to steadily progress after they develop.  There is no cure for MS, but medicines can help decrease the number and frequency of attacks and help relieve nuisance symptoms. Treatment may also include physical or occupational therapy.  If you develop numbness, paralysis, vision problems, or other neurological symptoms, get help right away. This information is not intended to replace advice given to you by your health care provider. Make sure you discuss any questions you have with your health care provider. Document Revised: 10/11/2017 Document Reviewed: 01/07/2017 Elsevier Patient Education  2020 [employer].    Your provider has requested that you have labwork completed today. Please go to [facility] ([id number]) on the second floor of this building before leaving the office today. You do not need to check in. If you are not called within 15 minutes please check with the front desk.  Solumedrol infusion start February 04, 2020 for 3  days at 9:30, [phone] [address]   Dr. Baker Pierini will be contacting you for an eye [phone].

## 2020-01-23 LAB — ACUTE HEP PANEL AND HEP B SURFACE AB
HEPATITIS C ANTIBODY REFILL$(REFL): NONREACTIVE
Hep A IgM: NONREACTIVE
Hep B C IgM: NONREACTIVE
Hepatitis B Surface Ag: NONREACTIVE
SIGNAL TO CUT-OFF: 0.04 (ref <1.00)

## 2020-01-23 LAB — QUANTIFERON-TB GOLD PLUS
Mitogen-NIL: >10 IU/mL
NIL: 0.03 IU/mL
QuantiFERON-TB Gold Plus: NEGATIVE
TB1-NIL: <0 IU/mL
TB2-NIL: 0 IU/mL

## 2020-01-23 LAB — REFLEX TIQ

## 2020-01-23 LAB — VITAMIN D 25 HYDROXY (VIT D DEFICIENCY, FRACTURES): Vit D, 25-Hydroxy: 9 ng/mL — ABNORMAL LOW (ref 30–100)

## 2020-01-25 ENCOUNTER — Telehealth: Payer: Self-pay | Admitting: Neurology

## 2020-01-25 MED ORDER — CHOLECALCIFEROL 100 MCG (4000 UT) PO CAPS: 1.0000 | ORAL_CAPSULE | Freq: Every day | ORAL | Status: AC

## 2020-01-25 NOTE — Telephone Encounter (Signed)
Patient notified to go to the ED if the pain is severe. And to take vitamin D3 4000 units daily. She voiced understanding.   Patient wanted to know if she could go to work after infusion.  Per Dr Everlena Cooper patient should to be ok to go to work after.  Patient wanted to know how long the infusion would take? Per Dr Everlena Cooper maybe about a hour and a half but he is not sure. Advised patient to call the facility where she has the appt for a more accurate time. Patient voiced understanding.

## 2020-01-25 NOTE — Telephone Encounter (Signed)
Left message with the after hour service on 01-25-20 @ 1:22 pm   Caller states that she was seen last week was set up for 3 infusions she wants to talk to someone about the infusions she having severe neck pain and back pain infusions are asch for 02-04-20  And the pain is very severe today  Pt cant turn her neck and walk  No fever  please

## 2020-01-25 NOTE — Telephone Encounter (Signed)
Of note, her vitamin D level is very low.  Recommend that she start D3 4000 IU daily

## 2020-01-25 NOTE — Telephone Encounter (Signed)
Unfortunately, the soonest we can get the IV steroids scheduled is 3/25.  f her symptoms are that severe, she would need to go back to the hospital for admission for treatment.  That is the fastest way to get her the steroid therapy.

## 2020-02-04 ENCOUNTER — Ambulatory Visit (HOSPITAL_COMMUNITY)
Admission: RE | Admit: 2020-02-04 | Discharge: 2020-02-04 | Disposition: A | Discharge status: Home / Self Care | Payer: Medicaid Other | Source: Ambulatory Visit | Attending: Internal Medicine | Admitting: Internal Medicine

## 2020-02-04 ENCOUNTER — Other Ambulatory Visit: Payer: Self-pay

## 2020-02-04 DIAGNOSIS — G35 Multiple sclerosis: Secondary | ICD-10-CM | POA: Insufficient documentation

## 2020-02-04 MED ORDER — SODIUM CHLORIDE 0.9 % IV SOLN
INTRAVENOUS | Status: DC | PRN
  Administered 2020-02-04: 250 mL via INTRAVENOUS

## 2020-02-04 MED ORDER — SODIUM CHLORIDE 0.9 % IV SOLN
1000.0000 mg | Freq: Once | INTRAVENOUS | Status: AC
  Administered 2020-02-04: 1000 mg via INTRAVENOUS
  Filled 2020-02-04: qty 1000

## 2020-02-04 NOTE — Progress Notes (Addendum)
PATIENT CARE CENTER NOTE  Diagnosis: Multiple Sclerosis    Provider: Shon Millet, DO   Procedure: Solu-medrol IV   Note: Patient received Solu-medrol 1000 mg via PIV. Tolerated well with no adverse reaction. Vital signs stable. Discharge instructions given. Patient is to come back tomorrow for second dose and is to received a total of 3 doses. Discharge instructions given. Patient alert, oriented and ambulatory at discharge.

## 2020-02-04 NOTE — Discharge Instructions (Signed)
Methylprednisolone Solution for Injection What is this medicine? METHYLPREDNISOLONE (meth ill pred NISS oh lone) is a corticosteroid. It is commonly used to treat inflammation of the skin, joints, lungs, and other organs. Common conditions treated include asthma, allergies, and arthritis. It is also used for other conditions, such as blood disorders and diseases of the adrenal glands. This medicine may be used for other purposes; ask your health care provider or pharmacist if you have questions. COMMON BRAND NAME(S): A-Methapred, Solu-Medrol What should I tell my health care provider before I take this medicine? They need to know if you have any of these conditions:  Cushing's syndrome  eye disease, vision problems  diabetes  glaucoma  heart disease  high blood pressure  infection (especially a virus infection such as chickenpox, cold sores, or herpes)  liver disease  mental illness  myasthenia gravis  osteoporosis  recently received or scheduled to receive a vaccine  seizures  stomach or intestine problems  thyroid disease  an unusual or allergic reaction to lactose, methylprednisolone, other medicines, foods, dyes, or preservatives  pregnant or trying to get pregnant  breast-feeding How should I use this medicine? This medicine is for injection or infusion into a vein. It is also for injection into a muscle. It is given by a health care professional in a hospital or clinic setting. Talk to your pediatrician regarding the use of this medicine in children. While this drug may be prescribed for selected conditions, precautions do apply. Overdosage: If you think you have taken too much of this medicine contact a poison control center or emergency room at once. NOTE: This medicine is only for you. Do not share this medicine with others. What if I miss a dose? This does not apply. What may interact with this medicine? Do not take this medicine with any of the following  medications:  alefacept  echinacea  iopamidol  live virus vaccines  metyrapone  mifepristone This medicine may also interact with the following medications:  amphotericin B  aspirin and aspirin-like medicines  certain antibiotics like erythromycin, clarithromycin, troleandomycin  certain medicines for diabetes  certain medicines for fungal infection like ketoconazole  certain medicines for seizures like carbamazepine, phenobarbital, phenytoin  certain medicines that treat or prevent blood clots like warfarin  cyclosporine  digoxin  diuretics  female hormones, like estrogens and birth control pills  isoniazid  NSAIDS, medicines for pain and inflammation, like ibuprofen or naproxen  other medicines for myasthenia gravis  rifampin  vaccines This list may not describe all possible interactions. Give your health care provider a list of all the medicines, herbs, non-prescription drugs, or dietary supplements you use. Also tell them if you smoke, drink alcohol, or use illegal drugs. Some items may interact with your medicine. What should I watch for while using this medicine? Tell your doctor or healthcare professional if your symptoms do not start to get better or if they get worse. Do not stop taking except on your doctor's advice. You may develop a severe reaction. Your doctor will tell you how much medicine to take. Your condition will be monitored carefully while you are receiving this medicine. This medicine may increase your risk of getting an infection. Tell your doctor or health care professional if you are around anyone with measles or chickenpox, or if you develop sores or blisters that do not heal properly. This medicine may increase blood sugar. Ask your healthcare provider if changes in diet or medicines are needed if you have diabetes. Tell   your doctor or health care professional right away if you have any change in your eyesight. Using this medicine for a  long time may increase your risk of low bone mass. Talk to your doctor about bone health. What side effects may I notice from receiving this medicine? Side effects that you should report to your doctor or health care professional as soon as possible:  allergic reactions like skin rash, itching or hives, swelling of the face, lips, or tongue  bloody or tarry stools  hallucination, loss of contact with reality  muscle cramps  muscle pain  palpitations  signs and symptoms of high blood sugar such as being more thirsty or hungry or having to urinate more than normal. You may also feel very tired or have blurry vision.  signs and symptoms of infection like fever or chills; cough; sore throat; pain or trouble passing urine  trouble passing urine Side effects that usually do not require medical attention (report to your doctor or health care professional if they continue or are bothersome):  changes in emotions or mood  constipation  diarrhea  excessive hair growth on the face or body  headache  nausea, vomiting  pain, redness, or irritation at site where injected  trouble sleeping  weight gain This list may not describe all possible side effects. Call your doctor for medical advice about side effects. You may report side effects to FDA at 1-800-FDA-1088. Where should I keep my medicine? This drug is given in a hospital or clinic and will not be stored at home. NOTE: This sheet is a summary. It may not cover all possible information. If you have questions about this medicine, talk to your doctor, pharmacist, or health care provider.  2020 Elsevier/Gold Standard (2018-07-31 09:12:19)  

## 2020-02-05 ENCOUNTER — Ambulatory Visit (HOSPITAL_COMMUNITY)
Admission: RE | Admit: 2020-02-05 | Discharge: 2020-02-05 | Disposition: A | Discharge status: Home / Self Care | Payer: Medicaid Other | Source: Ambulatory Visit | Attending: Internal Medicine | Admitting: Internal Medicine

## 2020-02-05 DIAGNOSIS — G35 Multiple sclerosis: Secondary | ICD-10-CM | POA: Diagnosis not present

## 2020-02-05 MED ORDER — SODIUM CHLORIDE 0.9 % IV SOLN
1000.0000 mg | Freq: Once | INTRAVENOUS | Status: AC
  Administered 2020-02-05: 1000 mg via INTRAVENOUS
  Filled 2020-02-05: qty 8

## 2020-02-05 MED ORDER — SODIUM CHLORIDE 0.9 % IV SOLN
INTRAVENOUS | Status: DC | PRN
  Administered 2020-02-05: 250 mL via INTRAVENOUS

## 2020-02-05 NOTE — Discharge Instructions (Signed)
Methylprednisolone Solution for Injection What is this medicine? METHYLPREDNISOLONE (meth ill pred NISS oh lone) is a corticosteroid. It is commonly used to treat inflammation of the skin, joints, lungs, and other organs. Common conditions treated include asthma, allergies, and arthritis. It is also used for other conditions, such as blood disorders and diseases of the adrenal glands. This medicine may be used for other purposes; ask your health care provider or pharmacist if you have questions. COMMON BRAND NAME(S): A-Methapred, Solu-Medrol What should I tell my health care provider before I take this medicine? They need to know if you have any of these conditions:  Cushing's syndrome  eye disease, vision problems  diabetes  glaucoma  heart disease  high blood pressure  infection (especially a virus infection such as chickenpox, cold sores, or herpes)  liver disease  mental illness  myasthenia gravis  osteoporosis  recently received or scheduled to receive a vaccine  seizures  stomach or intestine problems  thyroid disease  an unusual or allergic reaction to lactose, methylprednisolone, other medicines, foods, dyes, or preservatives  pregnant or trying to get pregnant  breast-feeding How should I use this medicine? This medicine is for injection or infusion into a vein. It is also for injection into a muscle. It is given by a health care professional in a hospital or clinic setting. Talk to your pediatrician regarding the use of this medicine in children. While this drug may be prescribed for selected conditions, precautions do apply. Overdosage: If you think you have taken too much of this medicine contact a poison control center or emergency room at once. NOTE: This medicine is only for you. Do not share this medicine with others. What if I miss a dose? This does not apply. What may interact with this medicine? Do not take this medicine with any of the following  medications:  alefacept  echinacea  iopamidol  live virus vaccines  metyrapone  mifepristone This medicine may also interact with the following medications:  amphotericin B  aspirin and aspirin-like medicines  certain antibiotics like erythromycin, clarithromycin, troleandomycin  certain medicines for diabetes  certain medicines for fungal infection like ketoconazole  certain medicines for seizures like carbamazepine, phenobarbital, phenytoin  certain medicines that treat or prevent blood clots like warfarin  cyclosporine  digoxin  diuretics  female hormones, like estrogens and birth control pills  isoniazid  NSAIDS, medicines for pain and inflammation, like ibuprofen or naproxen  other medicines for myasthenia gravis  rifampin  vaccines This list may not describe all possible interactions. Give your health care provider a list of all the medicines, herbs, non-prescription drugs, or dietary supplements you use. Also tell them if you smoke, drink alcohol, or use illegal drugs. Some items may interact with your medicine. What should I watch for while using this medicine? Tell your doctor or healthcare professional if your symptoms do not start to get better or if they get worse. Do not stop taking except on your doctor's advice. You may develop a severe reaction. Your doctor will tell you how much medicine to take. Your condition will be monitored carefully while you are receiving this medicine. This medicine may increase your risk of getting an infection. Tell your doctor or health care professional if you are around anyone with measles or chickenpox, or if you develop sores or blisters that do not heal properly. This medicine may increase blood sugar. Ask your healthcare provider if changes in diet or medicines are needed if you have diabetes. Tell   your doctor or health care professional right away if you have any change in your eyesight. Using this medicine for a  long time may increase your risk of low bone mass. Talk to your doctor about bone health. What side effects may I notice from receiving this medicine? Side effects that you should report to your doctor or health care professional as soon as possible:  allergic reactions like skin rash, itching or hives, swelling of the face, lips, or tongue  bloody or tarry stools  hallucination, loss of contact with reality  muscle cramps  muscle pain  palpitations  signs and symptoms of high blood sugar such as being more thirsty or hungry or having to urinate more than normal. You may also feel very tired or have blurry vision.  signs and symptoms of infection like fever or chills; cough; sore throat; pain or trouble passing urine  trouble passing urine Side effects that usually do not require medical attention (report to your doctor or health care professional if they continue or are bothersome):  changes in emotions or mood  constipation  diarrhea  excessive hair growth on the face or body  headache  nausea, vomiting  pain, redness, or irritation at site where injected  trouble sleeping  weight gain This list may not describe all possible side effects. Call your doctor for medical advice about side effects. You may report side effects to FDA at 1-800-FDA-1088. Where should I keep my medicine? This drug is given in a hospital or clinic and will not be stored at home. NOTE: This sheet is a summary. It may not cover all possible information. If you have questions about this medicine, talk to your doctor, pharmacist, or health care provider.  2020 Elsevier/Gold Standard (2018-07-31 09:12:19)  

## 2020-02-05 NOTE — Progress Notes (Signed)
Patient received Solu-medrol 1000 mg via PIV as ordered by Shon Millet, MD. Tolerated well, vitals stable, discharge instructions given, verbalized understanding. Patient alert, oriented and ambulatory at the time of discharge.

## 2020-02-08 ENCOUNTER — Ambulatory Visit (HOSPITAL_COMMUNITY)
Admission: RE | Admit: 2020-02-08 | Discharge: 2020-02-08 | Disposition: A | Discharge status: Home / Self Care | Payer: Medicaid Other | Source: Ambulatory Visit | Attending: Internal Medicine | Admitting: Internal Medicine

## 2020-02-08 ENCOUNTER — Other Ambulatory Visit: Payer: Self-pay

## 2020-02-08 DIAGNOSIS — G35 Multiple sclerosis: Secondary | ICD-10-CM | POA: Diagnosis not present

## 2020-02-08 MED ORDER — SODIUM CHLORIDE 0.9 % IV SOLN
1000.0000 mg | Freq: Once | INTRAVENOUS | Status: AC
  Administered 2020-02-08: 1000 mg via INTRAVENOUS
  Filled 2020-02-08: qty 8

## 2020-02-08 MED ORDER — SODIUM CHLORIDE 0.9 % IV SOLN
INTRAVENOUS | Status: DC | PRN
  Administered 2020-02-08: 250 mL via INTRAVENOUS

## 2020-02-08 NOTE — Progress Notes (Signed)
PATIENT CARE CENTER NOTE  Diagnosis: Multiple Sclerosis    Provider: Shon Millet, DO   Procedure: Solu-medrol IV   Note: Patient received third infusion of Solu-medrol 1000 mg via PIV. Tolerated well with no adverse reaction. Vital signs stable. Discharge instructions given. Patient alert, oriented and ambulatory at discharge.

## 2020-02-08 NOTE — Discharge Instructions (Signed)
Methylprednisolone Solution for Injection What is this medicine? METHYLPREDNISOLONE (meth ill pred NISS oh lone) is a corticosteroid. It is commonly used to treat inflammation of the skin, joints, lungs, and other organs. Common conditions treated include asthma, allergies, and arthritis. It is also used for other conditions, such as blood disorders and diseases of the adrenal glands. This medicine may be used for other purposes; ask your health care provider or pharmacist if you have questions. COMMON BRAND NAME(S): A-Methapred, Solu-Medrol What should I tell my health care provider before I take this medicine? They need to know if you have any of these conditions:  Cushing's syndrome  eye disease, vision problems  diabetes  glaucoma  heart disease  high blood pressure  infection (especially a virus infection such as chickenpox, cold sores, or herpes)  liver disease  mental illness  myasthenia gravis  osteoporosis  recently received or scheduled to receive a vaccine  seizures  stomach or intestine problems  thyroid disease  an unusual or allergic reaction to lactose, methylprednisolone, other medicines, foods, dyes, or preservatives  pregnant or trying to get pregnant  breast-feeding How should I use this medicine? This medicine is for injection or infusion into a vein. It is also for injection into a muscle. It is given by a health care professional in a hospital or clinic setting. Talk to your pediatrician regarding the use of this medicine in children. While this drug may be prescribed for selected conditions, precautions do apply. Overdosage: If you think you have taken too much of this medicine contact a poison control center or emergency room at once. NOTE: This medicine is only for you. Do not share this medicine with others. What if I miss a dose? This does not apply. What may interact with this medicine? Do not take this medicine with any of the following  medications:  alefacept  echinacea  iopamidol  live virus vaccines  metyrapone  mifepristone This medicine may also interact with the following medications:  amphotericin B  aspirin and aspirin-like medicines  certain antibiotics like erythromycin, clarithromycin, troleandomycin  certain medicines for diabetes  certain medicines for fungal infection like ketoconazole  certain medicines for seizures like carbamazepine, phenobarbital, phenytoin  certain medicines that treat or prevent blood clots like warfarin  cyclosporine  digoxin  diuretics  female hormones, like estrogens and birth control pills  isoniazid  NSAIDS, medicines for pain and inflammation, like ibuprofen or naproxen  other medicines for myasthenia gravis  rifampin  vaccines This list may not describe all possible interactions. Give your health care provider a list of all the medicines, herbs, non-prescription drugs, or dietary supplements you use. Also tell them if you smoke, drink alcohol, or use illegal drugs. Some items may interact with your medicine. What should I watch for while using this medicine? Tell your doctor or healthcare professional if your symptoms do not start to get better or if they get worse. Do not stop taking except on your doctor's advice. You may develop a severe reaction. Your doctor will tell you how much medicine to take. Your condition will be monitored carefully while you are receiving this medicine. This medicine may increase your risk of getting an infection. Tell your doctor or health care professional if you are around anyone with measles or chickenpox, or if you develop sores or blisters that do not heal properly. This medicine may increase blood sugar. Ask your healthcare provider if changes in diet or medicines are needed if you have diabetes. Tell   your doctor or health care professional right away if you have any change in your eyesight. Using this medicine for a  long time may increase your risk of low bone mass. Talk to your doctor about bone health. What side effects may I notice from receiving this medicine? Side effects that you should report to your doctor or health care professional as soon as possible:  allergic reactions like skin rash, itching or hives, swelling of the face, lips, or tongue  bloody or tarry stools  hallucination, loss of contact with reality  muscle cramps  muscle pain  palpitations  signs and symptoms of high blood sugar such as being more thirsty or hungry or having to urinate more than normal. You may also feel very tired or have blurry vision.  signs and symptoms of infection like fever or chills; cough; sore throat; pain or trouble passing urine  trouble passing urine Side effects that usually do not require medical attention (report to your doctor or health care professional if they continue or are bothersome):  changes in emotions or mood  constipation  diarrhea  excessive hair growth on the face or body  headache  nausea, vomiting  pain, redness, or irritation at site where injected  trouble sleeping  weight gain This list may not describe all possible side effects. Call your doctor for medical advice about side effects. You may report side effects to FDA at 1-800-FDA-1088. Where should I keep my medicine? This drug is given in a hospital or clinic and will not be stored at home. NOTE: This sheet is a summary. It may not cover all possible information. If you have questions about this medicine, talk to your doctor, pharmacist, or health care provider.  2020 Elsevier/Gold Standard (2018-07-31 09:12:19)  

## 2020-02-24 ENCOUNTER — Other Ambulatory Visit: Payer: Self-pay | Admitting: Neurology

## 2020-02-24 ENCOUNTER — Telehealth: Payer: Self-pay | Admitting: Neurology

## 2020-02-24 MED ORDER — PREDNISONE 10 MG PO TABS: ORAL_TABLET | ORAL | 0 refills | Status: DC

## 2020-02-24 NOTE — Telephone Encounter (Signed)
Patient called stating she has had a headache for over a week.  [facility] [address]

## 2020-02-24 NOTE — Telephone Encounter (Signed)
To break this intractable headache, I prescribed a prednisone taper.  While on the prednisone, she should not take NSAIDs such as ibuprofen as it may upset her stomach.

## 2020-02-25 NOTE — Telephone Encounter (Signed)
LMOVM, to call us back.

## 2020-03-11 ENCOUNTER — Telehealth: Payer: Self-pay

## 2020-03-11 ENCOUNTER — Other Ambulatory Visit: Payer: Self-pay

## 2020-03-11 DIAGNOSIS — G35 Multiple sclerosis: Secondary | ICD-10-CM

## 2020-03-11 LAB — STRATIFY JCV(TM) AB W/INDEX
JCV Antibody by Inhibition: NEGATIVE
JCV Index Value: 0.22
JCV Interpretation: NEGATIVE

## 2020-03-11 LAB — NEUROMYELITIS OPTICA AUTOAB, IGG: NMO IgG Autoantibodies: <1.5 U/mL (ref 0.0–3.0)

## 2020-03-11 NOTE — Telephone Encounter (Signed)
Usually steroids make people feel better.  If a person is having an MS flare-up, it helps calm it down faster.  I can't predict how long she is not going to feel well.  The Tysabri infusion is a different type of medication.  It is a medication that she takes once a month to help prevent MS flare up and progression of MS.  What kind of pain and discomfort is she referring?

## 2020-03-11 NOTE — Telephone Encounter (Signed)
Pt called requesting to be seen today. Per pt states MS symptoms are getting worse. Left side numb worse now after infusion and steroids for MS. Numbness  worse last night and today fells like it is moving from feet up. Please call pt. [phone]

## 2020-03-11 NOTE — Telephone Encounter (Signed)
-----   Message from Drema Dallas, DO sent at 03/11/2020 10:03 AM EDT ----- Labs all reviewed.  JC virus antibody negative.  If she is agreeable, I would go ahead and start Tysabri, the monthly infusion.  I want her to take D3 4000 IU daily for low vitamin D.  We will need to repeat CBC with diff, CMP, JC virus antibody with index and vitamin D in 6 months (prior to her follow up)

## 2020-03-11 NOTE — Progress Notes (Signed)
Pt labs orders added.   Please schedule before her next f/u 07/28/20.  pt is aware.

## 2020-03-11 NOTE — Telephone Encounter (Signed)
Pt states she already did the steroid infusions and they made her feel her feel worse. How is this new infusion supposed to make her feel any better or help with the symptoms?   Pt wanted to also if she could have something in the mean time she is in a lot of pain and discomfort?

## 2020-03-14 NOTE — Telephone Encounter (Signed)
She already received steroids.  We can put in for a stat MRI but the only way to get emergent/immediate treatment/imaging is to go to the ED.

## 2020-03-15 NOTE — Telephone Encounter (Signed)
Spoke to pt on 03/14/20. Advised pt of Dr. Everlena Cooper note.   Advised pt waiting on PA for Tysabri.

## 2020-03-19 ENCOUNTER — Emergency Department (HOSPITAL_COMMUNITY)
Admission: EM | Admit: 2020-03-19 | Discharge: 2020-03-19 | Disposition: A | Discharge status: Home / Self Care | Payer: Medicaid Other | Attending: Emergency Medicine | Admitting: Emergency Medicine

## 2020-03-19 ENCOUNTER — Other Ambulatory Visit: Payer: Self-pay

## 2020-03-19 ENCOUNTER — Encounter (HOSPITAL_COMMUNITY): Payer: Self-pay | Admitting: Emergency Medicine

## 2020-03-19 DIAGNOSIS — Z79899 Other long term (current) drug therapy: Secondary | ICD-10-CM | POA: Diagnosis not present

## 2020-03-19 DIAGNOSIS — F17210 Nicotine dependence, cigarettes, uncomplicated: Secondary | ICD-10-CM | POA: Insufficient documentation

## 2020-03-19 DIAGNOSIS — G35 Multiple sclerosis: Secondary | ICD-10-CM | POA: Insufficient documentation

## 2020-03-19 DIAGNOSIS — R200 Anesthesia of skin: Secondary | ICD-10-CM | POA: Diagnosis not present

## 2020-03-19 MED ORDER — PREDNISONE 50 MG PO TABS
650.0000 mg | ORAL_TABLET | Freq: Once | ORAL | Status: AC
  Administered 2020-03-19: 650 mg via ORAL
  Filled 2020-03-19: qty 13

## 2020-03-19 MED ORDER — ACETAMINOPHEN 500 MG PO TABS
1000.0000 mg | ORAL_TABLET | Freq: Once | ORAL | Status: AC
  Administered 2020-03-19: 1000 mg via ORAL
  Filled 2020-03-19: qty 2

## 2020-03-19 MED ORDER — PREDNISONE 50 MG PO TABS: 650.0000 mg | ORAL_TABLET | Freq: Every day | ORAL | 0 refills | Status: AC

## 2020-03-19 MED ORDER — PREDNISONE 50 MG PO TABS: 650.0000 mg | ORAL_TABLET | Freq: Every day | ORAL | 0 refills | Status: DC

## 2020-03-19 NOTE — Discharge Instructions (Addendum)
As discussed, we are initiating therapy for your numbness, with concern for an MS flare.  It is very portly monitor your condition carefully, and do not hesitate to return here.  Otherwise, please be sure to call your neurologist on Monday to ensure appropriate ongoing care.  In addition to your steroids, please use Tylenol for pain control.  This may be taken up to 3 times daily, 650 mg each time.

## 2020-03-19 NOTE — ED Provider Notes (Signed)
[facility]-EMERGENCY DEPT Provider Note   CSN: [id number] Arrival date & time: 03/19/20  1706     History Chief Complaint  Patient presents with  . MS flare up    [name] is a 29 y.o. female.  HPI   Patient presents with concern of numbness, discomfort.  Patient has history of MS, diagnosed 2 months ago.  She is currently working with her neurologist to initiate therapy with nonsteroidal infusions.  However, over the past 2 months she has had both inpatient infusion therapy sessions and outpatient steroid course.  Her most recent course of steroids ended 10 days ago.  She notes that over the past few days she has developed recurrence of the symptoms that she had during her initial diagnosis, but with more severity. She complains of numbness in her hands, feet bilaterally, difficulty with ambulation, and soreness in her left thoracic region with associated numbness. No relief with anything. After speaking with her neurologist, she was encouraged to present her self here due to their lack of availability today.  Past Medical History:  Diagnosis Date  . GERD (gastroesophageal reflux disease)    with pregnancy   . Headache     Patient Active Problem List   Diagnosis Date Noted  . Multiple sclerosis (HCC) 01/10/2020  . S/P repeat low transverse C-section 01/27/2018  . Previous cesarean section 01/26/2018  . 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 Complete miscarriage 08/20/2014    Past Surgical History:  Procedure Laterality Date  . ADENOIDECTOMY    . CESAREAN SECTION    . CESAREAN SECTION N/A 01/16/2016   Procedure: CESAREAN SECTION;  Surgeon: Richarda Overlie, MD;  Location: [facility] ORS;  Service: Obstetrics;  Laterality: N/A;  Repeat edc 01/23/16 NKDA  . CESAREAN SECTION WITH BILATERAL TUBAL LIGATION Bilateral 01/27/2018   Procedure: REPEAT CESAREAN SECTION WITH BILATERAL TUBAL LIGATION;  Surgeon: Osborn Coho, MD;  Location: [facility] BIRTHING SU[facility];   Service: Obstetrics;  Laterality: Bilateral;  . TONSILLECTOMY       OB History    Gravida  4   Para  3   Term  2   Preterm  1   AB  1   Living  3     SAB      TAB  1   Ectopic      Multiple  0   Live Births  3           Family History  Problem Relation Age of Onset  . Hypertension Mother   . Diabetes Maternal Grandmother     Social History   Tobacco Use  . Smoking status: Current Every Day Smoker    Packs/day: 0.25    Types: Cigarettes  . Smokeless tobacco: Never Used  Substance Use Topics  . Alcohol use: Yes    Comment: occasional - not during pregnancy  . Drug use: No    Home Medications Prior to Admission medications   Medication Sig Start Date End Date Taking? Authorizing Provider  acetaminophen (TYLENOL) 500 MG tablet Take 1,000 mg by mouth every 6 (six) hours as needed for mild pain or headache.     [provider]  Cholecalciferol 100 MCG (4000 UT) CAPS Take 1 capsule (4,000 Units total) by mouth daily. 01/25/20   Drema Dallas, DO  ibuprofen (ADVIL,MOTRIN) 800 MG tablet Take 1 tablet (800 mg total) by mouth every 8 (eight) hours. 01/30/18   Prothero, Henderson Newcomer, CNM  oxyCODONE (OXY IR/ROXICODONE) 5 MG immediate release tablet  Take 1 tablet (5 mg total) by mouth every 4 (four) hours as needed (pain scale 4-7). 01/30/18   Prothero, Henderson Newcomer, CNM  predniSONE (DELTASONE) 50 MG tablet Take 13 tablets (650 mg total) by mouth daily with breakfast for 4 days. For the next four days 03/19/20 03/23/20  Gerhard Munch, MD    Allergies    Patient has no known allergies.  Review of Systems   Review of Systems  Constitutional:       Per HPI, otherwise negative  HENT:       Per HPI, otherwise negative  Respiratory:       Per HPI, otherwise negative  Cardiovascular:       Per HPI, otherwise negative  Gastrointestinal: Negative for vomiting.  Endocrine:       Negative aside from HPI  Genitourinary:       Neg aside from HPI     Musculoskeletal:       Per HPI, otherwise negative  Skin: Negative.   Allergic/Immunologic:       Per HPI otherwise no new symptoms.  Neurological: Negative for syncope.  Hematological: Negative.   Psychiatric/Behavioral: Negative.     Physical Exam Updated Vital Signs BP 113/73 (BP Location: Left Arm)   Pulse 99   Temp 98.1 F (36.7 C) (Oral)   Resp 18   LMP 03/19/2020   SpO2 100%   Physical Exam Vitals and nursing note reviewed.  Constitutional:      General: She is not in acute distress.    Appearance: She is well-developed.  HENT:     Head: Normocephalic and atraumatic.  Eyes:     Conjunctiva/sclera: Conjunctivae normal.  Cardiovascular:     Rate and Rhythm: Normal rate and regular rhythm.  Pulmonary:     Effort: Pulmonary effort is normal. No respiratory distress.     Breath sounds: Normal breath sounds. No stridor.  Abdominal:     General: There is no distension.  Skin:    General: Skin is warm and dry.  Neurological:     Mental Status: She is alert and oriented to person, place, and time.     Cranial Nerves: No cranial nerve deficit.     ED Results / Procedures / Treatments   Labs (all labs ordered are listed, but only abnormal results are displayed) Labs Reviewed - No data to display  EKG None  Radiology No results found.  Procedures Procedures (including critical care time)  Medications Ordered in ED Medications  predniSONE (DELTASONE) tablet 650 mg (650 mg Oral Given 03/19/20 1911)  acetaminophen (TYLENOL) tablet 1,000 mg (1,000 mg Oral Given 03/19/20 1910)    ED Course  I have reviewed the triage vital signs and the nursing notes.  Pertinent labs & imaging results that were available during my care of the patient were reviewed by me and considered in my medical decision making (see chart for details).  After initial evaluation reviewed patient chart including documentation from her neurologist office, prior MRI from 2 months ago with  multiple cervical and brain lesions consistent with MS. We reviewed patient's prior steroid dosing, plan for Tysabri infusion when approved. Patient denies lengthy conversation about options for therapy given her new numbness.  Patient has a strong preference for initiation of oral therapy, discharge with close outpatient neurology follow-up Given the severity of her symptoms, she will start prednisone bolus, short course of 650 daily, will use Tylenol for pain control. Absent other new complaints, fever, hemodynamic instability, falling, confusion, no  indication for additional advanced imaging today, nor labs. Patient appropriate for discharge with close outpatient follow-up. Final Clinical Impression(s) / ED Diagnoses Final diagnoses:  Numbness    Rx / DC Orders ED Discharge Orders         Ordered    predniSONE (DELTASONE) 50 MG tablet  Daily with breakfast     03/19/20 1816           Carmin Muskrat, MD 03/19/20 1916

## 2020-03-19 NOTE — ED Triage Notes (Signed)
Per patient, states MS flare up for a little over a week-left sided discomfort-

## 2020-03-21 ENCOUNTER — Telehealth: Payer: Self-pay

## 2020-03-21 NOTE — Telephone Encounter (Signed)
Per Dr. Everlena Cooper [name] can be put on the cancellation list.

## 2020-03-21 NOTE — Telephone Encounter (Signed)
Patient contacted and is aware she's been added to the wait list.

## 2020-03-21 NOTE — Telephone Encounter (Signed)
Called patient to offer her appt on Friday 03-25-20. She states that she could not do Friday 03-25-20 and that she spoke to someone in our office. Patient was placed on the wait list

## 2020-03-21 NOTE — Telephone Encounter (Signed)
-----   Message from Drema Dallas, DO sent at 03/21/2020  7:18 AM EDT ----- Annabelle Harman, Would you contact patient to see if we can get her in the 11:10 slot on Friday?  Follow up visit.   Thanks

## 2020-03-21 NOTE — Telephone Encounter (Signed)
LMOVM. Please give [place] a call back. Dr. Everlena Cooper would like to see you this Friday at 11:10am for a F/U

## 2020-03-21 NOTE — Telephone Encounter (Signed)
Patient returned call and said she can only come Wednesday-Thursday this week due to work.

## 2020-07-11 ENCOUNTER — Other Ambulatory Visit (INDEPENDENT_AMBULATORY_CARE_PROVIDER_SITE_OTHER): Payer: Medicaid Other

## 2020-07-11 ENCOUNTER — Other Ambulatory Visit: Payer: Self-pay

## 2020-07-11 DIAGNOSIS — G35 Multiple sclerosis: Secondary | ICD-10-CM

## 2020-07-11 LAB — CBC WITH DIFFERENTIAL/PLATELET
Basophils Absolute: 0 10*3/uL (ref 0.0–0.1)
Basophils Relative: 0.9 % (ref 0.0–3.0)
Eosinophils Absolute: 0.1 10*3/uL (ref 0.0–0.7)
Eosinophils Relative: 1.1 % (ref 0.0–5.0)
HCT: 33.6 % — ABNORMAL LOW (ref 36.0–46.0)
Hemoglobin: 11.3 g/dL — ABNORMAL LOW (ref 12.0–15.0)
Lymphocytes Relative: 44.7 % (ref 12.0–46.0)
Lymphs Abs: 2.1 10*3/uL (ref 0.7–4.0)
MCHC: 33.7 g/dL (ref 30.0–36.0)
MCV: 87.6 fl (ref 78.0–100.0)
Monocytes Absolute: 0.3 10*3/uL (ref 0.1–1.0)
Monocytes Relative: 5.8 % (ref 3.0–12.0)
Neutro Abs: 2.2 10*3/uL (ref 1.4–7.7)
Neutrophils Relative %: 47.5 % (ref 43.0–77.0)
Platelets: 266 10*3/uL (ref 150.0–400.0)
RBC: 3.84 Mil/uL — ABNORMAL LOW (ref 3.87–5.11)
RDW: 12.8 % (ref 11.5–15.5)
WBC: 4.7 10*3/uL (ref 4.0–10.5)

## 2020-07-11 LAB — COMPREHENSIVE METABOLIC PANEL
ALT: 8 U/L (ref 0–35)
AST: 14 U/L (ref 0–37)
Albumin: 4.2 g/dL (ref 3.5–5.2)
Alkaline Phosphatase: 34 U/L — ABNORMAL LOW (ref 39–117)
BUN: 9 mg/dL (ref 6–23)
CO2: 23 mEq/L (ref 19–32)
Calcium: 8.9 mg/dL (ref 8.4–10.5)
Chloride: 107 mEq/L (ref 96–112)
Creatinine, Ser: 0.63 mg/dL (ref 0.40–1.20)
GFR: 134.73 mL/min (ref >60.00)
Glucose, Bld: 94 mg/dL (ref 70–99)
Potassium: 3.6 mEq/L (ref 3.5–5.1)
Sodium: 138 mEq/L (ref 135–145)
Total Bilirubin: 0.4 mg/dL (ref 0.2–1.2)
Total Protein: 6.9 g/dL (ref 6.0–8.3)

## 2020-07-11 LAB — VITAMIN D 25 HYDROXY (VIT D DEFICIENCY, FRACTURES): VITD: 24.46 ng/mL — ABNORMAL LOW (ref 30.00–100.00)

## 2020-07-25 ENCOUNTER — Telehealth: Payer: Self-pay

## 2020-07-25 ENCOUNTER — Telehealth: Payer: Self-pay | Admitting: Neurology

## 2020-07-25 NOTE — Telephone Encounter (Signed)
Patient advised to keep appt and follow up with Dr. Everlena Cooper to discuss medication.

## 2020-07-25 NOTE — Telephone Encounter (Signed)
Patient wants to know when her next infusion is scheduled. Please call patient with this information.  She may want to reschedule her appointment with Dr. Everlena Cooper on 07/28/20, depending on the answer.

## 2020-07-26 NOTE — Progress Notes (Signed)
Virtual Visit via Video Note The purpose of this virtual visit is to provide medical care while limiting exposure to the novel coronavirus.    Consent was obtained for video visit:  Yes.   Answered questions that patient had about telehealth interaction:  Yes.   I discussed the limitations, risks, security and privacy concerns of performing an evaluation and management service by telemedicine. I also discussed with the patient that there may be a patient responsible charge related to this service. The patient expressed understanding and agreed to proceed.  Pt location: Home Physician Location: office Name of referring provider:  No ref. provider found I connected with [name] at patients initiation/request on 07/28/2020 at  9:50 AM EDT by video enabled telemedicine application and verified that I am speaking with the correct person using two identifiers. Pt MRN:  [id number] Pt DOB:  20-Jul-1991 Video Participants:  [name];    History of Present Illness:  [name] is a 29 year old right-handed black woman who follows up for multiple sclerosis.  UPDATE: Current DMT:  None Other medications:  D3 5000 IU daily.  Seen in initial consultation back in March.  Due to ongoing symptoms including new visual disturbance, she underwent another round of SoluMedrol 1g daily for 3 days.  Given the severity of her symptoms and active cervical cord lesion on initial clinical flare up, it was decided to start with Tysabri..  She saw ophthalmology in April.  Exam demonstrated no optic neurlitis.  However, she never started Tysabri as Medicaid would not approve it.  Thus, she has had a physical decline off of DMT.  She reported worsening bilateral numbness in hands and feet in late April.  She was seen in the ED on 5/8 where she was given a course of high-dose prednisone.   Symptoms have improved since then: Vision:  No issues Motor:  No issues Sensory:  Sometimes has numbness  and tingling in hands and feet Pain:  No issues Gait:  No issues Bowel/Bladder:  No issues  01/21/2020 LABS:  JC Virus Index 0.22, antibody by inhibition negatve; Quantiferon-TB Gold negative; Hep B negative; vitamin D 9; NMO IgG negative 07/11/2020 LABS:  CBC with WBC 4.7, HGB 11.3, HCT 33.6, PLT 266, ALC 2.1; CMP with Na 138, K 3.6, Cl 107, CO2 23, glucose 94, BUN 9, Cr 0.63, t bili 0.4, ALP 34, AST 14, ALT 8; Vit D 24.46.  HISTORY: She presented to [facility] ED on 01/10/2020 for evaluation of numbness and tingling of the left side of her body involving arm, torso and leg but not her face.  She also endorsed some mild left arm weakness as well.  Symptoms started 4 days prior to evaluation. MRI of brain and cervical spine with and without contrast showed a 12 mm hyperintense T2/FLAIR focus within the left medial parietal lobe as well as two other foci within the frontoparietal vertex subcortical white matter.  Contrast enhancement noted in the medial parietal lesion.  An enhancing lesion was also noted within the right side of the cervical cord at the C2-C3 levels.  Hospital admission for acute treatment was recommended but patient left against medical advice.  Since then, she has developed intermittent blurred vision and pressure behind her eyes.  Sometimes her right foot feels numb.  She has pain in her left hand, which makes lifting objects difficult.  On occasion, she reports word-finding difficulty.  In retrospect, no identifiable neurologic event that stands out. No  family history of MS.  Past Medical History: Past Medical History:  Diagnosis Date  . GERD (gastroesophageal reflux disease)    with pregnancy   . Headache     Medications: Outpatient Encounter Medications as of 07/28/2020  Medication Sig  . acetaminophen (TYLENOL) 500 MG tablet Take 1,000 mg by mouth every 6 (six) hours as needed for mild pain or headache.   . Cholecalciferol 100 MCG (4000 UT) CAPS Take 1 capsule  (4,000 Units total) by mouth daily.  Marland Kitchen ibuprofen (ADVIL,MOTRIN) 800 MG tablet Take 1 tablet (800 mg total) by mouth every 8 (eight) hours.  Marland Kitchen oxyCODONE (OXY IR/ROXICODONE) 5 MG immediate release tablet Take 1 tablet (5 mg total) by mouth every 4 (four) hours as needed (pain scale 4-7).   No facility-administered encounter medications on file as of 07/28/2020.    Allergies: No Known Allergies  Family History: Family History  Problem Relation Age of Onset  . Hypertension Mother   . Diabetes Maternal Grandmother     Social History: Social History   Socioeconomic History  . Marital status: Single    Spouse name: Not on file  . Number of children: 3  . Years of education: Not on file  . Highest education level: Some college, no degree  Occupational History  . Not on file  Tobacco Use  . Smoking status: Current Every Day Smoker    Packs/day: 0.25    Types: Cigarettes  . Smokeless tobacco: Never Used  Vaping Use  . Vaping Use: Never used  Substance and Sexual Activity  . Alcohol use: Yes    Comment: occasional - not during pregnancy  . Drug use: No  . Sexual activity: Yes    Birth control/protection: None  Other Topics Concern  . Not on file  Social History Narrative   Right handed   Two story home   Drinks caffeine    Social Determinants of Health   Financial Resource Strain:   . Difficulty of Paying Living Expenses: Not on file  Food Insecurity:   . Worried About Programme researcher, broadcasting/film/video in the Last Year: Not on file  . Ran Out of Food in the Last Year: Not on file  Transportation Needs:   . Lack of Transportation (Medical): Not on file  . Lack of Transportation (Non-Medical): Not on file  Physical Activity:   . Days of Exercise per Week: Not on file  . Minutes of Exercise per Session: Not on file  Stress:   . Feeling of Stress : Not on file  Social Connections:   . Frequency of Communication with Friends and Family: Not on file  . Frequency of Social Gatherings  with Friends and Family: Not on file  . Attends Religious Services: Not on file  . Active Member of Clubs or Organizations: Not on file  . Attends Banker Meetings: Not on file  . Marital Status: Not on file  Intimate Partner Violence:   . Fear of Current or Ex-Partner: Not on file  . Emotionally Abused: Not on file  . Physically Abused: Not on file  . Sexually Abused: Not on file    Observations/Objective:   Height 5\' 3"  (1.6 m), weight 150 lb (68 kg), unknown if currently breastfeeding. No acute distress.  Alert and oriented.  Speech fluent and not dysarthric.  Language intact.  Eyes orthophoric on primary gaze.  Face symmetric.  Assessment and Plan:   Relapsing remitting multiple sclerosis.  Medicaid has not approved Tysbari. I  will try to appeal this decision.  Due to the severity of her symptoms and involvement of the spinal cord on initial diagnosis, she would benefit most from an infusion.   1.  As it has been 6 months, we will repeat JC Virus antibody and index. 2.  As over 6 months have passed off of DMT, we will need to establish a new baseline prior to initiating DMT - repeat MRI of brain/cervical/thoracic spine with and without contrast. 3.  Increase D3 to 10,000 IU daily 4.  Follow up in 6 months.   Follow Up Instructions:    -I discussed the assessment and treatment plan with the patient. The patient was provided an opportunity to ask questions and all were answered. The patient agreed with the plan and demonstrated an understanding of the instructions.   The patient was advised to call back or seek an in-person evaluation if the symptoms worsen or if the condition fails to improve as anticipated.   Cira Servant, DO

## 2020-07-28 ENCOUNTER — Other Ambulatory Visit: Payer: Self-pay

## 2020-07-28 ENCOUNTER — Encounter: Payer: Self-pay | Admitting: Neurology

## 2020-07-28 ENCOUNTER — Telehealth (INDEPENDENT_AMBULATORY_CARE_PROVIDER_SITE_OTHER): Payer: Medicaid Other | Admitting: Neurology

## 2020-07-28 DIAGNOSIS — G35 Multiple sclerosis: Secondary | ICD-10-CM

## 2020-08-03 ENCOUNTER — Telehealth: Payer: Self-pay

## 2020-08-03 ENCOUNTER — Other Ambulatory Visit: Payer: Self-pay

## 2020-08-03 LAB — STRATIFY JCV(TM) AB W/INDEX
JCV Antibody: NEGATIVE
JCV Index Value: 0.16

## 2020-08-03 NOTE — Telephone Encounter (Signed)
Pt advised of her JC virus results.  Pt advised to come by the office and sign the forms  so we can send off her forms for Tysabri. Pt wants to do her infusion at the Patient care center.   Once forms are signed by provider and Pt I will fax forms to [facility]

## 2020-08-03 NOTE — Telephone Encounter (Signed)
-----   Message from Drema Dallas, DO sent at 08/03/2020 11:38 AM EDT ----- JC Virus antibody negative.  Proceed with initiating Tysabri

## 2020-08-09 ENCOUNTER — Telehealth: Payer: Self-pay

## 2020-08-09 ENCOUNTER — Other Ambulatory Visit: Payer: Self-pay

## 2020-08-09 DIAGNOSIS — G35 Multiple sclerosis: Secondary | ICD-10-CM

## 2020-08-09 NOTE — Telephone Encounter (Signed)
Telephone call from Crystal from [employer], Pt is schedule to have her Tysabri infusion on 08/15/20.   Per the pt infusion site we need to add her orders before her visit.

## 2020-08-09 NOTE — Telephone Encounter (Signed)
Orders placed in Epic for Hospital infusion

## 2020-08-15 ENCOUNTER — Other Ambulatory Visit: Payer: Self-pay

## 2020-08-15 ENCOUNTER — Ambulatory Visit (HOSPITAL_COMMUNITY)
Admission: RE | Admit: 2020-08-15 | Discharge: 2020-08-15 | Disposition: A | Discharge status: Home / Self Care | Payer: Medicaid Other | Source: Ambulatory Visit | Attending: Internal Medicine | Admitting: Internal Medicine

## 2020-08-15 ENCOUNTER — Encounter: Payer: Self-pay | Admitting: Neurology

## 2020-08-15 DIAGNOSIS — G35 Multiple sclerosis: Secondary | ICD-10-CM | POA: Insufficient documentation

## 2020-08-15 MED ORDER — SODIUM CHLORIDE 0.9 % IV SOLN
300.0000 mg | INTRAVENOUS | Status: DC
  Administered 2020-08-15: 300 mg via INTRAVENOUS
  Filled 2020-08-15: qty 15

## 2020-08-15 MED ORDER — ACETAMINOPHEN 325 MG PO TABS
650.0000 mg | ORAL_TABLET | Freq: Once | ORAL | Status: AC
  Administered 2020-08-15: 650 mg via ORAL
  Filled 2020-08-15: qty 2

## 2020-08-15 MED ORDER — LORATADINE 10 MG PO TABS
10.0000 mg | ORAL_TABLET | Freq: Once | ORAL | Status: AC
  Administered 2020-08-15: 10 mg via ORAL
  Filled 2020-08-15: qty 1

## 2020-08-15 MED ORDER — SODIUM CHLORIDE 0.9 % IV SOLN
INTRAVENOUS | Status: DC | PRN
  Administered 2020-08-15: 250 mL via INTRAVENOUS

## 2020-08-15 NOTE — Progress Notes (Signed)
PATIENT CARE CENTER NOTE  Diagnosis: Multiple sclerosis (HCC) (G35)   Provider: Shon Millet, MD   Procedure: Tysabri infusion    Note: Patient received Tysabri infusion via PIV. Pre-medications given (Tylenol and Claritin) per order. Patient tolerated infusion well with no adverse reaction. Observed patient for 1 hour post-infusion. Vital signs stable. Discharge instructions given. Patient to come back every 28 days for Tysabri infusions. Alert, oriented and ambulatory at discharge.

## 2020-08-15 NOTE — Discharge Instructions (Signed)
Natalizumab injection What is this medicine? NATALIZUMAB (na ta LIZ you mab) is used to treat relapsing multiple sclerosis. This drug is not a cure. It is also used to treat Crohn's disease. This medicine may be used for other purposes; ask your health care provider or pharmacist if you have questions. COMMON BRAND NAME(S): Tysabri What should I tell my health care provider before I take this medicine? They need to know if you have any of these conditions:  immune system problems  progressive multifocal leukoencephalopathy (PML)  an unusual or allergic reaction to natalizumab, other medicines, foods, dyes, or preservatives  pregnant or trying to get pregnant  breast-feeding How should I use this medicine? This medicine is for infusion into a vein. It is given by a health care professional in a hospital or clinic setting. A special MedGuide will be given to you by the pharmacist with each prescription and refill. Be sure to read this information carefully each time. Talk to your pediatrician regarding the use of this medicine in children. This medicine is not approved for use in children. Overdosage: If you think you have taken too much of this medicine contact a poison control center or emergency room at once. NOTE: This medicine is only for you. Do not share this medicine with others. What if I miss a dose? It is important not to miss your dose. Call your doctor or health care professional if you are unable to keep an appointment. What may interact with this medicine? Do not take this medicine with any of the following medications:  biologic medicines such as adalimumab, certolizumab, etanercept, golimumab, infliximab This medicine may also interact with the following medications:  azathioprine  cyclosporine  interferons  6-mercaptopurine  methotrexate  other medicines that lower your chance of fighting an infection  steroid medicines like prednisone or  cortisone  vaccines This list may not describe all possible interactions. Give your health care provider a list of all the medicines, herbs, non-prescription drugs, or dietary supplements you use. Also tell them if you smoke, drink alcohol, or use illegal drugs. Some items may interact with your medicine. What should I watch for while using this medicine? Your condition will be monitored carefully while you are receiving this medicine. Visit your doctor for regular check ups. Tell your doctor or healthcare professional if your symptoms do not start to get better or if they get worse. Stay away from people who are sick. Call your doctor or health care professional for advice if you get a fever, chills or sore throat, or other symptoms of a cold or flu. Do not treat yourself. In some patients, this medicine may cause a serious brain infection that may cause death. If you have any problems seeing, thinking, speaking, walking, or standing, tell your doctor right away. If you cannot reach your doctor, get urgent medical care. What side effects may I notice from receiving this medicine? Side effects that you should report to your doctor or health care professional as soon as possible:  allergic reactions like skin rash, itching or hives, swelling of the face, lips, or tongue  breathing problems  changes in vision  chest pain  confusion  depressed mood  dizziness  feeling faint; lightheaded; falls  general ill feeling or flu-like symptoms  loss of memory  missed menstrual periods  muscle weakness  problems with balance, talking, or walking  signs and symptoms of liver injury like dark yellow or brown urine; general ill feeling or flu-like symptoms; light-colored   stools; loss of appetite; nausea; right upper belly pain; unusually weak or tired; yellowing of the eyes or skin  suicidal thoughts, mood changes  unusual bruising or bleeding  unusually weak or tired Side effects that  usually do not require medical attention (report to your doctor or health care professional if they continue or are bothersome):  headache  joint pain  muscle cramps  muscle pain  nausea, vomiting  pain, redness, or irritation at site where injected  tiredness This list may not describe all possible side effects. Call your doctor for medical advice about side effects. You may report side effects to FDA at 1-800-FDA-1088. Where should I keep my medicine? This drug is given in a hospital or clinic and will not be stored at home. NOTE: This sheet is a summary. It may not cover all possible information. If you have questions about this medicine, talk to your doctor, pharmacist, or health care provider.  2020 Elsevier/Gold Standard (2019-05-04 13:20:26)  

## 2020-08-15 NOTE — Progress Notes (Signed)
Received fax from [employer] of authorization approval valid from 08/15/20 to 02/13/21 for the patient's Tysabri. Sent for patient's chart.

## 2020-08-19 ENCOUNTER — Ambulatory Visit
Admission: RE | Admit: 2020-08-19 | Discharge: 2020-08-19 | Disposition: A | Discharge status: Home / Self Care | Payer: Medicaid Other | Source: Ambulatory Visit | Attending: Neurology | Admitting: Neurology

## 2020-08-19 ENCOUNTER — Other Ambulatory Visit: Payer: Self-pay

## 2020-08-19 DIAGNOSIS — G35 Multiple sclerosis: Secondary | ICD-10-CM

## 2020-08-19 MED ORDER — GADOBENATE DIMEGLUMINE 529 MG/ML IV SOLN
14.0000 mL | Freq: Once | INTRAVENOUS | Status: AC | PRN
  Administered 2020-08-19: 14 mL via INTRAVENOUS

## 2020-08-22 ENCOUNTER — Ambulatory Visit
Admission: RE | Admit: 2020-08-22 | Discharge: 2020-08-22 | Disposition: A | Discharge status: Home / Self Care | Payer: Medicaid Other | Source: Ambulatory Visit | Attending: Neurology | Admitting: Neurology

## 2020-08-22 ENCOUNTER — Other Ambulatory Visit: Payer: Self-pay

## 2020-08-22 DIAGNOSIS — G35 Multiple sclerosis: Secondary | ICD-10-CM

## 2020-08-22 MED ORDER — GADOBENATE DIMEGLUMINE 529 MG/ML IV SOLN
14.0000 mL | Freq: Once | INTRAVENOUS | Status: AC | PRN
  Administered 2020-08-22: 14 mL via INTRAVENOUS

## 2020-09-12 ENCOUNTER — Encounter (HOSPITAL_COMMUNITY): Payer: Medicaid Other

## 2020-09-14 ENCOUNTER — Other Ambulatory Visit: Payer: Self-pay

## 2020-09-14 ENCOUNTER — Ambulatory Visit (HOSPITAL_COMMUNITY)
Admission: RE | Admit: 2020-09-14 | Discharge: 2020-09-14 | Disposition: A | Discharge status: Home / Self Care | Payer: Medicaid Other | Source: Ambulatory Visit | Attending: Internal Medicine | Admitting: Internal Medicine

## 2020-09-14 DIAGNOSIS — G35 Multiple sclerosis: Secondary | ICD-10-CM | POA: Insufficient documentation

## 2020-09-14 MED ORDER — ACETAMINOPHEN 325 MG PO TABS
650.0000 mg | ORAL_TABLET | Freq: Once | ORAL | Status: AC
  Administered 2020-09-14: 650 mg via ORAL
  Filled 2020-09-14: qty 2

## 2020-09-14 MED ORDER — SODIUM CHLORIDE 0.9 % IV SOLN
INTRAVENOUS | Status: DC | PRN
  Administered 2020-09-14: 250 mL via INTRAVENOUS

## 2020-09-14 MED ORDER — SODIUM CHLORIDE 0.9 % IV SOLN
300.0000 mg | INTRAVENOUS | Status: DC
  Administered 2020-09-14: 300 mg via INTRAVENOUS
  Filled 2020-09-14: qty 15

## 2020-09-14 MED ORDER — LORATADINE 10 MG PO TABS
10.0000 mg | ORAL_TABLET | Freq: Every day | ORAL | Status: DC
  Administered 2020-09-14: 10 mg via ORAL
  Filled 2020-09-14: qty 1

## 2020-09-14 NOTE — Progress Notes (Signed)
Prior to the infusion, patient complained of frontal headache which she described as "not unusual". RN contacted Shon Millet MD's office. Spoke with [employer]. Per MD it's okay to infuse. Patient received IV Tysabri with pre medications given as ordered by Shon Millet MD.  Declined the 60 minutes post infusion observation.Tolerated well, discharge instructions given, verbalized understanding. Patient alert, oriented and ambulatory at the time of discharge.

## 2020-09-14 NOTE — Discharge Instructions (Signed)
Natalizumab injection What is this medicine? NATALIZUMAB (na ta LIZ you mab) is used to treat relapsing multiple sclerosis. This drug is not a cure. It is also used to treat Crohn's disease. This medicine may be used for other purposes; ask your health care provider or pharmacist if you have questions. COMMON BRAND NAME(S): Tysabri What should I tell my health care provider before I take this medicine? They need to know if you have any of these conditions:  immune system problems  progressive multifocal leukoencephalopathy (PML)  an unusual or allergic reaction to natalizumab, other medicines, foods, dyes, or preservatives  pregnant or trying to get pregnant  breast-feeding How should I use this medicine? This medicine is for infusion into a vein. It is given by a health care professional in a hospital or clinic setting. A special MedGuide will be given to you by the pharmacist with each prescription and refill. Be sure to read this information carefully each time. Talk to your pediatrician regarding the use of this medicine in children. This medicine is not approved for use in children. Overdosage: If you think you have taken too much of this medicine contact a poison control center or emergency room at once. NOTE: This medicine is only for you. Do not share this medicine with others. What if I miss a dose? It is important not to miss your dose. Call your doctor or health care professional if you are unable to keep an appointment. What may interact with this medicine? Do not take this medicine with any of the following medications:  biologic medicines such as adalimumab, certolizumab, etanercept, golimumab, infliximab This medicine may also interact with the following medications:  azathioprine  cyclosporine  interferons  6-mercaptopurine  methotrexate  other medicines that lower your chance of fighting an infection  steroid medicines like prednisone or  cortisone  vaccines This list may not describe all possible interactions. Give your health care provider a list of all the medicines, herbs, non-prescription drugs, or dietary supplements you use. Also tell them if you smoke, drink alcohol, or use illegal drugs. Some items may interact with your medicine. What should I watch for while using this medicine? Your condition will be monitored carefully while you are receiving this medicine. Visit your doctor for regular check ups. Tell your doctor or healthcare professional if your symptoms do not start to get better or if they get worse. Stay away from people who are sick. Call your doctor or health care professional for advice if you get a fever, chills or sore throat, or other symptoms of a cold or flu. Do not treat yourself. In some patients, this medicine may cause a serious brain infection that may cause death. If you have any problems seeing, thinking, speaking, walking, or standing, tell your doctor right away. If you cannot reach your doctor, get urgent medical care. What side effects may I notice from receiving this medicine? Side effects that you should report to your doctor or health care professional as soon as possible:  allergic reactions like skin rash, itching or hives, swelling of the face, lips, or tongue  breathing problems  changes in vision  chest pain  confusion  depressed mood  dizziness  feeling faint; lightheaded; falls  general ill feeling or flu-like symptoms  loss of memory  missed menstrual periods  muscle weakness  problems with balance, talking, or walking  signs and symptoms of liver injury like dark yellow or brown urine; general ill feeling or flu-like symptoms; light-colored   stools; loss of appetite; nausea; right upper belly pain; unusually weak or tired; yellowing of the eyes or skin  suicidal thoughts, mood changes  unusual bruising or bleeding  unusually weak or tired Side effects that  usually do not require medical attention (report to your doctor or health care professional if they continue or are bothersome):  headache  joint pain  muscle cramps  muscle pain  nausea, vomiting  pain, redness, or irritation at site where injected  tiredness This list may not describe all possible side effects. Call your doctor for medical advice about side effects. You may report side effects to FDA at 1-800-FDA-1088. Where should I keep my medicine? This drug is given in a hospital or clinic and will not be stored at home. NOTE: This sheet is a summary. It may not cover all possible information. If you have questions about this medicine, talk to your doctor, pharmacist, or health care provider.  2020 Elsevier/Gold Standard (2019-05-04 13:20:26)  

## 2020-10-15 ENCOUNTER — Emergency Department (HOSPITAL_COMMUNITY)
Admission: EM | Admit: 2020-10-15 | Discharge: 2020-10-15 | Disposition: A | Discharge status: Home / Self Care | Payer: Medicaid Other | Attending: Emergency Medicine | Admitting: Emergency Medicine

## 2020-10-15 ENCOUNTER — Other Ambulatory Visit: Payer: Self-pay

## 2020-10-15 ENCOUNTER — Encounter (HOSPITAL_COMMUNITY): Payer: Self-pay | Admitting: *Deleted

## 2020-10-15 DIAGNOSIS — H5712 Ocular pain, left eye: Secondary | ICD-10-CM | POA: Insufficient documentation

## 2020-10-15 DIAGNOSIS — R6884 Jaw pain: Secondary | ICD-10-CM | POA: Diagnosis not present

## 2020-10-15 DIAGNOSIS — Z5321 Procedure and treatment not carried out due to patient leaving prior to being seen by health care provider: Secondary | ICD-10-CM | POA: Diagnosis not present

## 2020-10-15 DIAGNOSIS — G35 Multiple sclerosis: Secondary | ICD-10-CM | POA: Insufficient documentation

## 2020-10-15 HISTORY — DX: Multiple sclerosis: G35

## 2020-10-15 NOTE — ED Triage Notes (Addendum)
Left eye and jaw pain x 3 days, pt has MS and is unsure if it is related to it. No visual changes, just pain. States she can not eat or chew anything on her left side due to pain

## 2020-10-16 ENCOUNTER — Emergency Department (HOSPITAL_COMMUNITY)
Admission: EM | Admit: 2020-10-16 | Discharge: 2020-10-16 | Disposition: A | Discharge status: Home / Self Care | Payer: Medicaid Other | Attending: Emergency Medicine | Admitting: Emergency Medicine

## 2020-10-16 DIAGNOSIS — R519 Headache, unspecified: Secondary | ICD-10-CM | POA: Insufficient documentation

## 2020-10-16 DIAGNOSIS — G500 Trigeminal neuralgia: Secondary | ICD-10-CM

## 2020-10-16 MED ORDER — CARBAMAZEPINE ER 100 MG PO TB12: 100.0000 mg | ORAL_TABLET | Freq: Two times a day (BID) | ORAL | 0 refills | Status: DC

## 2020-10-16 NOTE — ED Provider Notes (Signed)
[facility]-EMERGENCY DEPT Provider Note   CSN: [id number] Arrival date & time: 10/16/20  6967     History Chief Complaint  Patient presents with  . Facial Pain    [name] is a 29 y.o. female.  29 year old female presents with 4 days of left-sided facial pain.  States that the pain starts from in front of her ear on the left side and goes to the left side of her face as well as behind her eye.  Denies any vision loss.  Denies any eyeball pain.  No redness to her eye noted.  Patient does have a history of MS but states that this is different than her prior MS exacerbations.  Patient is taking regular medications for MS.  Denies any new focal weakness        Past Medical History:  Diagnosis Date  . GERD (gastroesophageal reflux disease)    with pregnancy   . Headache   . Multiple sclerosis [facility])     Patient Active Problem List   Diagnosis Date Noted  . Multiple sclerosis (HCC) 01/10/2020  . S/P repeat low transverse C-section 01/27/2018  . Previous cesarean section 01/26/2018  . 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 Complete miscarriage 08/20/2014    Past Surgical History:  Procedure Laterality Date  . ADENOIDECTOMY    . CESAREAN SECTION    . CESAREAN SECTION N/A 01/16/2016   Procedure: CESAREAN SECTION;  Surgeon: Richarda Overlie, MD;  Location: [facility] ORS;  Service: Obstetrics;  Laterality: N/A;  Repeat edc 01/23/16 NKDA  . CESAREAN SECTION WITH BILATERAL TUBAL LIGATION Bilateral 01/27/2018   Procedure: REPEAT CESAREAN SECTION WITH BILATERAL TUBAL LIGATION;  Surgeon: Osborn Coho, MD;  Location: [facility] BIRTHING SUITES;  Service: Obstetrics;  Laterality: Bilateral;  . TONSILLECTOMY       OB History    Gravida  4   Para  3   Term  2   Preterm  1   AB  1   Living  3     SAB      TAB  1   Ectopic      Multiple  0   Live Births  3           Family History  Problem Relation Age of Onset  . Hypertension Mother   . Diabetes  Maternal Grandmother     Social History   Tobacco Use  . Smoking status: Current Every Day Smoker    Packs/day: 0.25    Types: Cigarettes  . Smokeless tobacco: Never Used  Vaping Use  . Vaping Use: Never used  Substance Use Topics  . Alcohol use: Yes    Comment: occasional - not during pregnancy  . Drug use: No    Home Medications Prior to Admission medications   Medication Sig Start Date End Date Taking? Authorizing Provider  acetaminophen (TYLENOL) 500 MG tablet Take 1,000 mg by mouth every 6 (six) hours as needed for mild pain or headache.  Patient not taking: Reported on 07/28/2020    [provider]  Cholecalciferol 100 MCG (4000 UT) CAPS Take 1 capsule (4,000 Units total) by mouth daily. Patient not taking: Reported on 07/28/2020 01/25/20   Drema Dallas, DO  ibuprofen (ADVIL,MOTRIN) 800 MG tablet Take 1 tablet (800 mg total) by mouth every 8 (eight) hours. Patient not taking: Reported on 07/28/2020 01/30/18   Prothero, Henderson Newcomer, CNM  oxyCODONE (OXY IR/ROXICODONE) 5 MG immediate release tablet Take 1 tablet (5 mg total) by mouth  every 4 (four) hours as needed (pain scale 4-7). Patient not taking: Reported on 07/28/2020 01/30/18   Prothero, Henderson Newcomer, CNM    Allergies    Patient has no known allergies.  Review of Systems   Review of Systems  All other systems reviewed and are negative.   Physical Exam Updated Vital Signs BP 131/82 (BP Location: Right Arm)   Pulse 84   Temp 98.3 F (36.8 C) (Oral)   Resp 16   LMP 10/05/2020   SpO2 100%   Physical Exam Vitals and nursing note reviewed.  Constitutional:      General: She is not in acute distress.    Appearance: Normal appearance. She is well-developed. She is not toxic-appearing.  HENT:     Head: Normocephalic and atraumatic.   Eyes:     General: Lids are normal.     Conjunctiva/sclera: Conjunctivae normal.     Left eye: Left conjunctiva is not injected. No chemosis or exudate.    Pupils: Pupils are  equal, round, and reactive to light.     Comments: Left thigh without evidence of injection  Neck:     Thyroid: No thyroid mass.     Trachea: No tracheal deviation.  Cardiovascular:     Rate and Rhythm: Normal rate and regular rhythm.     Heart sounds: Normal heart sounds. No murmur heard.  No gallop.   Pulmonary:     Effort: Pulmonary effort is normal. No respiratory distress.     Breath sounds: Normal breath sounds. No stridor. No decreased breath sounds, wheezing, rhonchi or rales.  Abdominal:     General: Bowel sounds are normal. There is no distension.     Palpations: Abdomen is soft.     Tenderness: There is no abdominal tenderness. There is no rebound.  Musculoskeletal:        General: No tenderness. Normal range of motion.     Cervical back: Normal range of motion and neck supple.  Skin:    General: Skin is warm and dry.     Findings: No abrasion or rash.  Neurological:     General: No focal deficit present.     Mental Status: She is alert and oriented to person, place, and time.     GCS: GCS eye subscore is 4. GCS verbal subscore is 5. GCS motor subscore is 6.     Cranial Nerves: Cranial nerves are intact. No cranial nerve deficit.     Sensory: No sensory deficit.  Psychiatric:        Speech: Speech normal.        Behavior: Behavior normal.     ED Results / Procedures / Treatments   Labs (all labs ordered are listed, but only abnormal results are displayed) Labs Reviewed - No data to display  EKG None  Radiology No results found.  Procedures Procedures (including critical care time)  Medications Ordered in ED Medications - No data to display  ED Course  I have reviewed the triage vital signs and the nursing notes.  Pertinent labs & imaging results that were available during my care of the patient were reviewed by me and considered in my medical decision making (see chart for details).    MDM Rules/Calculators/A&P                          Suspect  that patient may have trigeminal neuralgia.  Will place on Tegretol and she will follow up with  her neurologist Final Clinical Impression(s) / ED Diagnoses Final diagnoses:  None    Rx / DC Orders ED Discharge Orders    None       Lorre Nick, MD 10/16/20 1011

## 2020-10-16 NOTE — Discharge Instructions (Signed)
Follow-up with your neurologist next week

## 2020-10-16 NOTE — ED Triage Notes (Signed)
Patient reports left eye and jaw pain x4 days. Patient reports she has MS and that the pain has never been this bad in her face but it is her left side that is normally affected. Patient denies trauma, visual changes, or other left sided weakness.

## 2020-10-17 ENCOUNTER — Telehealth: Payer: Self-pay | Admitting: Neurology

## 2020-10-17 NOTE — Telephone Encounter (Signed)
Telephone call to pt, Pt states today the pain level has decreased due to the medication prescribed over the weekend.   Pt advised front desk will call her to schedule a her to see Dr.Jaffe this week.

## 2020-10-17 NOTE — Telephone Encounter (Signed)
Called and lmom for patient to call back to sch appt

## 2020-10-17 NOTE — Telephone Encounter (Signed)
Patient has been scheduled for 10/21/20 at 1:30

## 2020-10-20 NOTE — Progress Notes (Signed)
NEUROLOGY FOLLOW UP OFFICE NOTE  [name] [id number]   Subjective:  [name] is a 29 year oldright-handed black woman with multiple sclerosis who follows up for new left-sided facial pain.  UPDATE: Current DMT:  Tysabri (s/p 2 infusions: 10/4, 11/3) Other medications:  Tegretol 100mg  BID, D3 5000 IU daily.  JCV antibody on 07/11/2020 was negative with index of 0.16.  Repeat MRI of brain with and without contrast on 10/82021 personally reviewed and showed new T2 hyperintense lesion within the pontomedullary junction without contrast enhancement as well as previous T2 focus within the left centrum semiovale/posterior frontal region no longer with contrast enhancement, and stable juxtacortical T2 lesions in the left frontoparietal vertex, as well as partial empty sella.  Repeat MRI of cervical spine with and without contrast showed significant decrease in size of T2 lesion at C2-C3 level on right without enhancement and no new lesions.  MRI of thoracic spine with and without contrast on 08/22/2020 was unremarkable.  She had first Tysabri infusion on 09/14/2020.  Vision:  No issues Motor:  No issues Sensory:  Sometimes has numbness and tingling in hands and feet Pain:  Last week, she developed left jaw aching.  Over the next few days, it progressed to severe sharp pain over her left eye and stabbing pain behind the eye.  Light touch over the left side of her face triggered the pain.  She went to the ED on 10/16/2020 were she was diagnosed with trigeminal neuralgia and started on Tegretol 100mg  BID.  It has helped but pain has not completely resolved. Gait:  No issues Bowel/Bladder:  No issues  01/21/2020 LABS:  JC Virus Index 0.22, antibody by inhibition negatve; Quantiferon-TB Gold negative; Hep B negative; vitamin D 9; NMO IgG negative 07/11/2020 LABS:  CBC with WBC 4.7, HGB 11.3, HCT 33.6, PLT 266, ALC 2.1; CMP with Na 138, K 3.6, Cl 107, CO2 23, glucose 94, BUN 9, Cr 0.63,  t bili 0.4, ALP 34, AST 14, ALT 8; Vit D 24.46.  HISTORY: She presented to [facility] ED on 01/10/2020 for evaluation of numbness and tingling of the left side of her body involving arm, torso and leg but not her face. She also endorsed some mild left arm weakness as well. Symptoms started 4 days prior to evaluation. MRI of brain and cervical spine with and without contrast showed a 12 mm hyperintense T2/FLAIR focus within the left medial parietal lobe as well as two other foci within the frontoparietal vertex subcortical white matter. Contrast enhancement noted in the medial parietal lesion. An enhancing lesion was also noted within the right side of the cervical cord at the C2-C3 levels. Hospital admission for acute treatment was recommended but patient left against medical advice. Since then, she has developed intermittent blurred vision and pressure behind her eyes. Sometimes her right foot feels numb. She has pain in her left hand, which makes lifting objects difficult. On occasion, she reports word-finding difficulty.  Seen by me in initial consultation in March 2021.  Due to ongoing symptoms including new visual disturbance, she underwent another round of SoluMedrol 1g daily for 3 days.  Given the severity of her symptoms and active cervical cord lesion on initial clinical flare up, it was decided to start with Tysabri..  She saw ophthalmology in April.  Exam demonstrated no optic neurlitis.  However, she never started Tysabri as Medicaid would not approve it.  Thus, she has had a physical decline off of DMT.  She reported worsening bilateral numbness in hands and feet in late April 2021.  She was seen in the ED on 5/8 where she was given a course of high-dose prednisone.   In retrospect, no identifiable neurologic event that stands out. No family historyof MS.  PAST MEDICAL HISTORY: Past Medical History:  Diagnosis Date  . GERD (gastroesophageal reflux disease)    with pregnancy   .  Headache   . Multiple sclerosis (HCC)     MEDICATIONS: Current Outpatient Medications on File Prior to Visit  Medication Sig Dispense Refill  . acetaminophen (TYLENOL) 500 MG tablet Take 1,000 mg by mouth every 6 (six) hours as needed for mild pain or headache.  (Patient not taking: Reported on 07/28/2020)    . carbamazepine (TEGRETOL-XR) 100 MG 12 hr tablet Take 1 tablet (100 mg total) by mouth 2 (two) times daily. 30 tablet 0  . Cholecalciferol 100 MCG (4000 UT) CAPS Take 1 capsule (4,000 Units total) by mouth daily. (Patient not taking: Reported on 07/28/2020) 30 capsule   . ibuprofen (ADVIL,MOTRIN) 800 MG tablet Take 1 tablet (800 mg total) by mouth every 8 (eight) hours. (Patient not taking: Reported on 07/28/2020) 30 tablet 0  . oxyCODONE (OXY IR/ROXICODONE) 5 MG immediate release tablet Take 1 tablet (5 mg total) by mouth every 4 (four) hours as needed (pain scale 4-7). (Patient not taking: Reported on 07/28/2020) 30 tablet 0   No current facility-administered medications on file prior to visit.    ALLERGIES: No Known Allergies  FAMILY HISTORY: Family History  Problem Relation Age of Onset  . Hypertension Mother   . Diabetes Maternal Grandmother     SOCIAL HISTORY: Social History   Socioeconomic History  . Marital status: Single    Spouse name: Not on file  . Number of children: 3  . Years of education: Not on file  . Highest education level: Some college, no degree  Occupational History  . Not on file  Tobacco Use  . Smoking status: Current Every Day Smoker    Packs/day: 0.25    Types: Cigarettes  . Smokeless tobacco: Never Used  Vaping Use  . Vaping Use: Never used  Substance and Sexual Activity  . Alcohol use: Yes    Comment: occasional - not during pregnancy  . Drug use: No  . Sexual activity: Yes    Birth control/protection: None  Other Topics Concern  . Not on file  Social History Narrative   Right handed   Two story home   Drinks caffeine    Social  Determinants of Health   Financial Resource Strain: Not on file  Food Insecurity: Not on file  Transportation Needs: Not on file  Physical Activity: Not on file  Stress: Not on file  Social Connections: Not on file  Intimate Partner Violence: Not on file     Objective:  Blood pressure 113/76, pulse 72, height 5\' 3"  (1.6 m), weight 152 lb 3.2 oz (69 kg), last menstrual period 10/05/2020, SpO2 100 %, unknown if currently breastfeeding. General: No acute distress.  Patient appears well-groomed.   Head:  Normocephalic/atraumatic Eyes:  Fundi examined but not visualized Neck: supple, no paraspinal tenderness, full range of motion Heart:  Regular rate and rhythm Lungs:  Clear to auscultation bilaterally Back: No paraspinal tenderness Neurological Exam: alert and oriented to person, place, and time. Attention span and concentration intact, recent and remote memory intact, fund of knowledge intact.  Speech fluent and not dysarthric, language intact.  CN II-XII  intact. Bulk and tone normal, muscle strength 5/5 throughout.  Sensation to light touch, temperature and vibration intact.  Deep tendon reflexes 2+ throughout, toes downgoing.  Finger to nose and heel to shin testing intact.  Gait normal, Romberg negative.   Assessment/Plan:   1.  Left-sided trigeminal neuralgia 2.  Multiple sclerosis  1.  Increase Tegretol to 200mg  twice daily 2.  Tysabri 3.  D3 5000 IU daily 4.  Follow up in March as scheduled.  April, DO

## 2020-10-21 ENCOUNTER — Ambulatory Visit: Payer: Medicaid Other | Admitting: Neurology

## 2020-10-21 ENCOUNTER — Other Ambulatory Visit: Payer: Self-pay

## 2020-10-21 ENCOUNTER — Encounter: Payer: Self-pay | Admitting: Neurology

## 2020-10-21 VITALS — BP 113/76 | HR 72 | Ht 63.0 in | Wt 152.2 lb

## 2020-10-21 DIAGNOSIS — G500 Trigeminal neuralgia: Secondary | ICD-10-CM

## 2020-10-21 DIAGNOSIS — G35 Multiple sclerosis: Secondary | ICD-10-CM

## 2020-10-21 MED ORDER — CARBAMAZEPINE ER 200 MG PO TB12: 200.0000 mg | ORAL_TABLET | Freq: Two times a day (BID) | ORAL | 5 refills | Status: DC

## 2020-10-21 NOTE — Patient Instructions (Signed)
1.  Increase carbamazepine to 200mg  twice daily 2.  Continue Tysabri 3.  Continue D3 5000 IU daily 4.  Follow up in March as scheduled.

## 2020-10-31 ENCOUNTER — Ambulatory Visit (HOSPITAL_COMMUNITY)
Admission: RE | Admit: 2020-10-31 | Discharge: 2020-10-31 | Disposition: A | Discharge status: Home / Self Care | Payer: Medicaid Other | Source: Ambulatory Visit | Attending: Internal Medicine | Admitting: Internal Medicine

## 2020-10-31 ENCOUNTER — Other Ambulatory Visit: Payer: Self-pay

## 2020-10-31 DIAGNOSIS — G35 Multiple sclerosis: Secondary | ICD-10-CM | POA: Diagnosis not present

## 2020-10-31 DIAGNOSIS — G35D Multiple sclerosis, unspecified: Secondary | ICD-10-CM

## 2020-10-31 MED ORDER — ACETAMINOPHEN 325 MG PO TABS
650.0000 mg | ORAL_TABLET | Freq: Once | ORAL | Status: AC
  Administered 2020-10-31: 650 mg via ORAL
  Filled 2020-10-31: qty 2

## 2020-10-31 MED ORDER — LORATADINE 10 MG PO TABS
10.0000 mg | ORAL_TABLET | Freq: Once | ORAL | Status: AC
  Administered 2020-10-31: 10 mg via ORAL
  Filled 2020-10-31: qty 1

## 2020-10-31 MED ORDER — SODIUM CHLORIDE 0.9 % IV SOLN
Freq: Once | INTRAVENOUS | Status: AC
  Administered 2020-10-31: 250 mL via INTRAVENOUS

## 2020-10-31 MED ORDER — SODIUM CHLORIDE 0.9 % IV SOLN
300.0000 mg | INTRAVENOUS | Status: DC
  Administered 2020-10-31: 300 mg via INTRAVENOUS
  Filled 2020-10-31: qty 15

## 2020-10-31 NOTE — Progress Notes (Signed)
Patient received IV Tysabri with pre medications given as ordered by Shon Millet MD.  Declined the 60 minutes post infusion observation.Tolerated well, discharge instructions given, verbalized understanding. Patient alert, oriented and ambulatory at the time of discharge.

## 2020-10-31 NOTE — Progress Notes (Signed)
Error

## 2020-10-31 NOTE — Discharge Instructions (Signed)
Natalizumab injection What is this medicine? NATALIZUMAB (na ta LIZ you mab) is used to treat relapsing multiple sclerosis. This drug is not a cure. It is also used to treat Crohn's disease. This medicine may be used for other purposes; ask your health care provider or pharmacist if you have questions. COMMON BRAND NAME(S): Tysabri What should I tell my health care provider before I take this medicine? They need to know if you have any of these conditions:  immune system problems  progressive multifocal leukoencephalopathy (PML)  an unusual or allergic reaction to natalizumab, other medicines, foods, dyes, or preservatives  pregnant or trying to get pregnant  breast-feeding How should I use this medicine? This medicine is for infusion into a vein. It is given by a health care professional in a hospital or clinic setting. A special MedGuide will be given to you by the pharmacist with each prescription and refill. Be sure to read this information carefully each time. Talk to your pediatrician regarding the use of this medicine in children. This medicine is not approved for use in children. Overdosage: If you think you have taken too much of this medicine contact a poison control center or emergency room at once. NOTE: This medicine is only for you. Do not share this medicine with others. What if I miss a dose? It is important not to miss your dose. Call your doctor or health care professional if you are unable to keep an appointment. What may interact with this medicine? Do not take this medicine with any of the following medications:  biologic medicines such as adalimumab, certolizumab, etanercept, golimumab, infliximab This medicine may also interact with the following medications:  azathioprine  cyclosporine  interferons  6-mercaptopurine  methotrexate  other medicines that lower your chance of fighting an infection  steroid medicines like prednisone or  cortisone  vaccines This list may not describe all possible interactions. Give your health care provider a list of all the medicines, herbs, non-prescription drugs, or dietary supplements you use. Also tell them if you smoke, drink alcohol, or use illegal drugs. Some items may interact with your medicine. What should I watch for while using this medicine? Your condition will be monitored carefully while you are receiving this medicine. Visit your doctor for regular check ups. Tell your doctor or healthcare professional if your symptoms do not start to get better or if they get worse. Stay away from people who are sick. Call your doctor or health care professional for advice if you get a fever, chills or sore throat, or other symptoms of a cold or flu. Do not treat yourself. In some patients, this medicine may cause a serious brain infection that may cause death. If you have any problems seeing, thinking, speaking, walking, or standing, tell your doctor right away. If you cannot reach your doctor, get urgent medical care. What side effects may I notice from receiving this medicine? Side effects that you should report to your doctor or health care professional as soon as possible:  allergic reactions like skin rash, itching or hives, swelling of the face, lips, or tongue  breathing problems  changes in vision  chest pain  confusion  depressed mood  dizziness  feeling faint; lightheaded; falls  general ill feeling or flu-like symptoms  loss of memory  missed menstrual periods  muscle weakness  problems with balance, talking, or walking  signs and symptoms of liver injury like dark yellow or brown urine; general ill feeling or flu-like symptoms; light-colored   stools; loss of appetite; nausea; right upper belly pain; unusually weak or tired; yellowing of the eyes or skin  suicidal thoughts, mood changes  unusual bruising or bleeding  unusually weak or tired Side effects that  usually do not require medical attention (report to your doctor or health care professional if they continue or are bothersome):  headache  joint pain  muscle cramps  muscle pain  nausea, vomiting  pain, redness, or irritation at site where injected  tiredness This list may not describe all possible side effects. Call your doctor for medical advice about side effects. You may report side effects to FDA at 1-800-FDA-1088. Where should I keep my medicine? This drug is given in a hospital or clinic and will not be stored at home. NOTE: This sheet is a summary. It may not cover all possible information. If you have questions about this medicine, talk to your doctor, pharmacist, or health care provider.  2020 Elsevier/Gold Standard (2019-05-04 13:20:26)  

## 2020-11-01 ENCOUNTER — Telehealth: Payer: Self-pay | Admitting: Neurology

## 2020-11-01 NOTE — Telephone Encounter (Signed)
Increase carbamazepine to 300mg  twice daily

## 2020-11-01 NOTE — Telephone Encounter (Signed)
Left message at 1250.

## 2020-11-01 NOTE — Telephone Encounter (Signed)
These pains are sharp and shooting pains, is taken all medication as prescribed. Please advise, she thinks its related to her last visit.

## 2020-11-01 NOTE — Telephone Encounter (Signed)
Patient called requesting a call back from a nurse. She said, "It's about pain in the back of my head that's been going on for three days now. I need to speak with someone."

## 2020-11-06 IMAGING — MR MR CERVICAL SPINE WO/W CM
6 of 8 series · 29 of 48 positions shown · IV contrast (14ml Multihance)
Comparison: MRI of the cervical spine January 10, 2020.

CLINICAL DATA: Multiple sclerosis.

EXAM:
MRI CERVICAL SPINE WITHOUT AND WITH CONTRAST
TECHNIQUE: Multiplanar and multiecho pulse sequences of the cervical spine, to
include the craniocervical junction and cervicothoracic junction,
were obtained without and with intravenous contrast.
CONTRAST:  14mL MULTIHANCE GADOBENATE DIMEGLUMINE 529 MG/ML IV SOLN

[Series 2: STIR · sagittal · 3.0mm · 0.82mm/px · 1 of 13 slices shown]
[im 1/13]
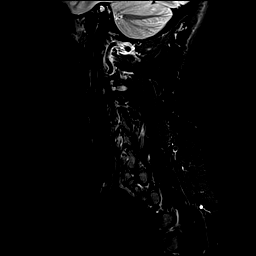

[Series 3: T1 · sagittal · 3.0mm · 0.41mm/px · 4 of 13 slices shown (1 of 2)]
[im 1/13]
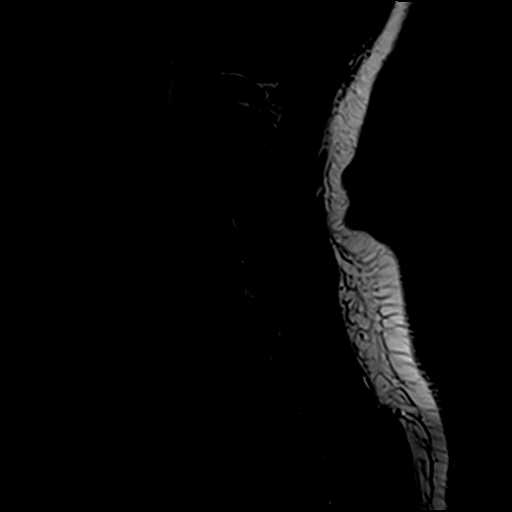
[im 5/13]
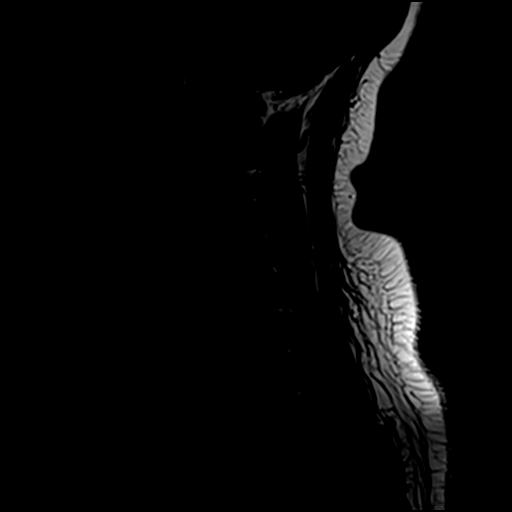
[im 9/13]
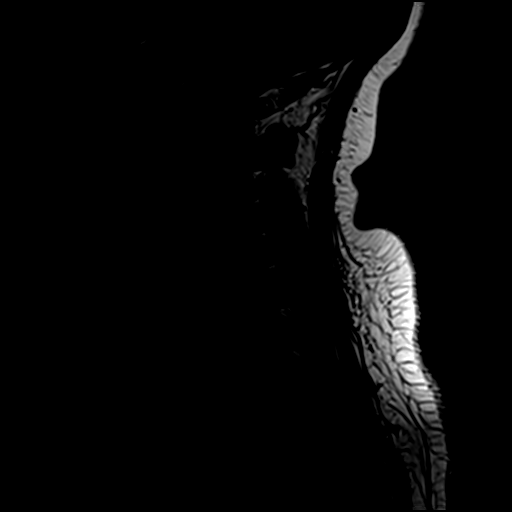
[im 13/13]
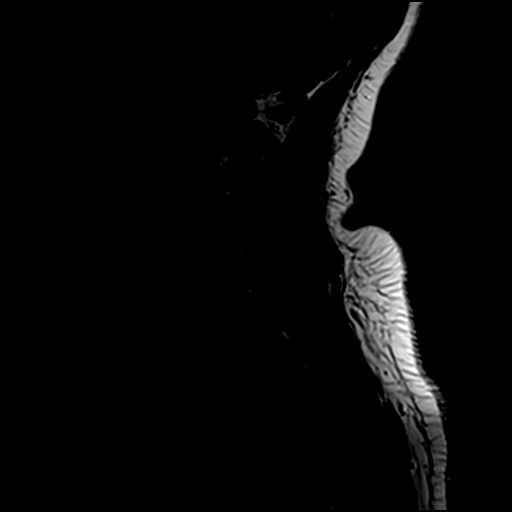

[Series 5: T2 · axial · 3.0mm · 0.70mm/px · z∈[-191,-99]mm · 8 of 26 slices shown (1 of 2)]
[im 1/26]
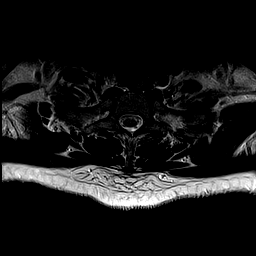
[im 4/26]
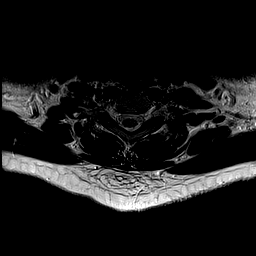
[im 8/26]
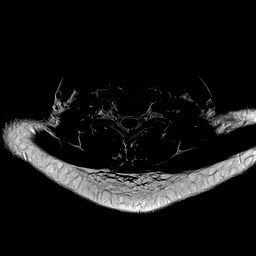
[im 11/26]
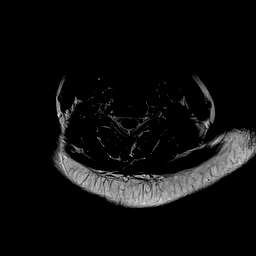
[im 15/26]
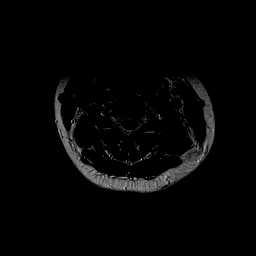
[im 18/26]
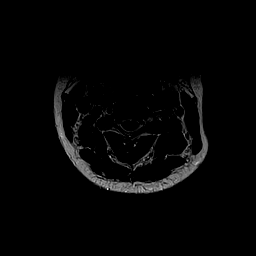
[im 22/26]
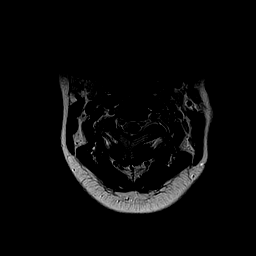
[im 26/26]
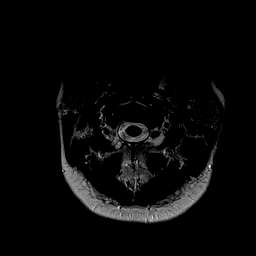

[Series 6: T1 · axial · 3.0mm · 0.35mm/px · z∈[-191,-99]mm · 8 of 26 slices shown (2 of 2)]
[im 1/26]
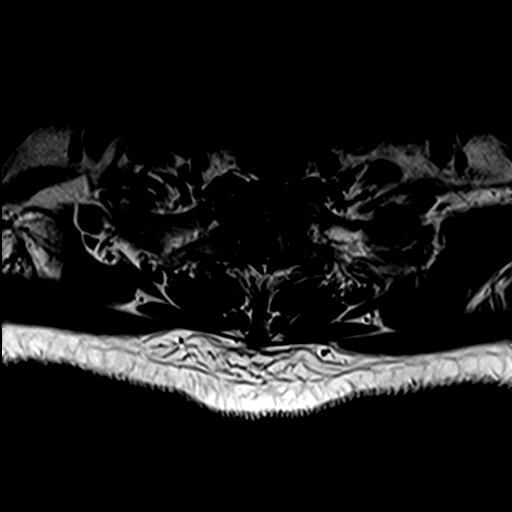
[im 4/26]
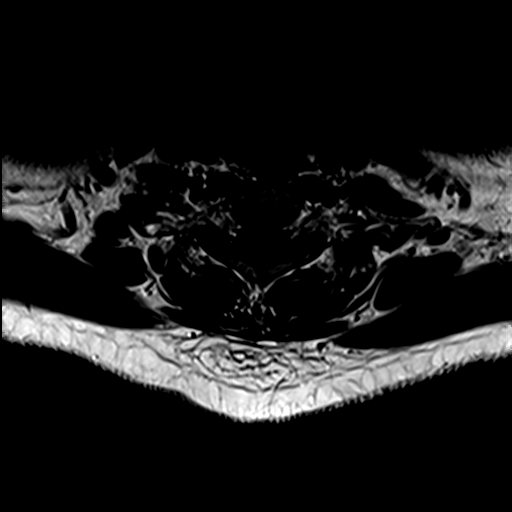
[im 8/26]
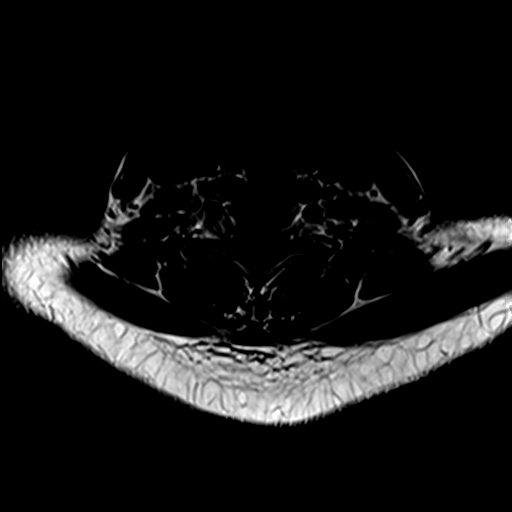
[im 11/26]
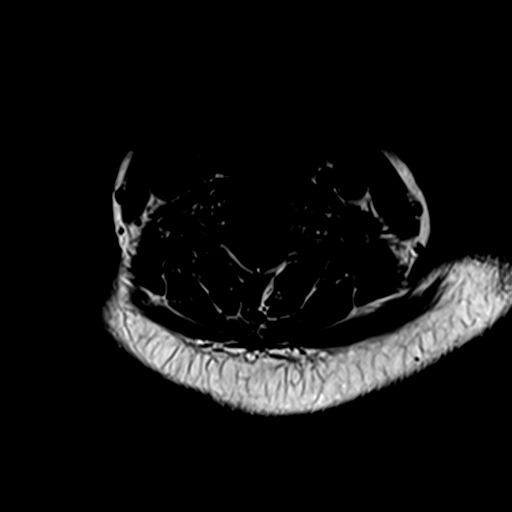
[im 15/26]
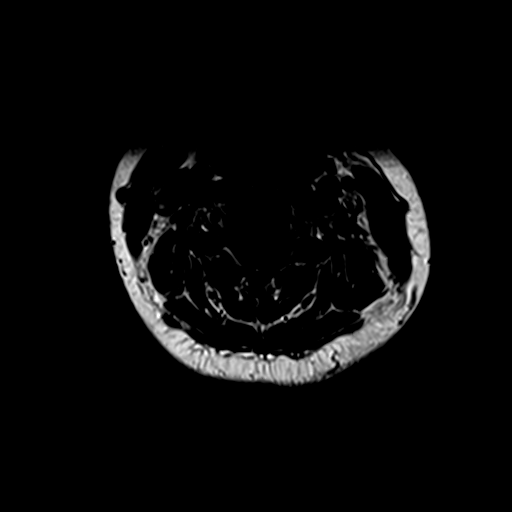
[im 18/26]
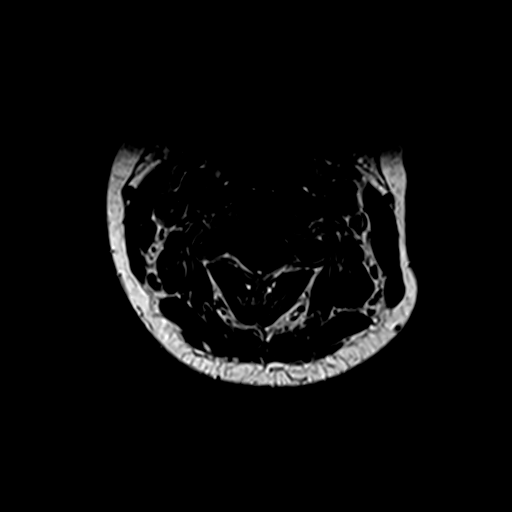
[im 22/26]
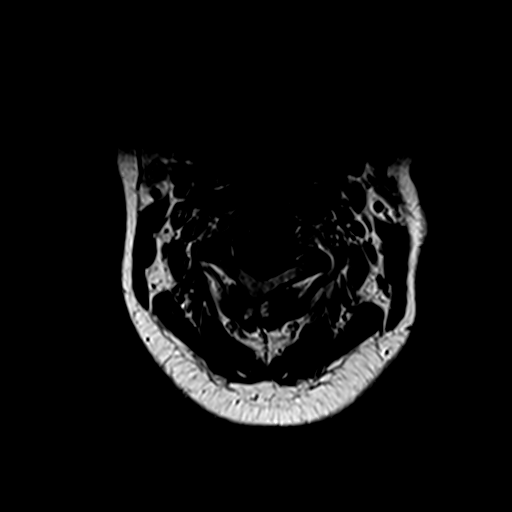
[im 26/26]
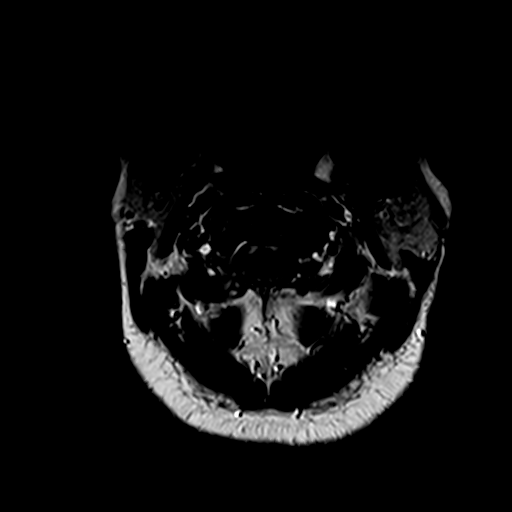

[Series 7: T2 · sagittal · 3.0mm · 0.41mm/px · 4 of 13 slices shown (2 of 2)]
[im 1/13]
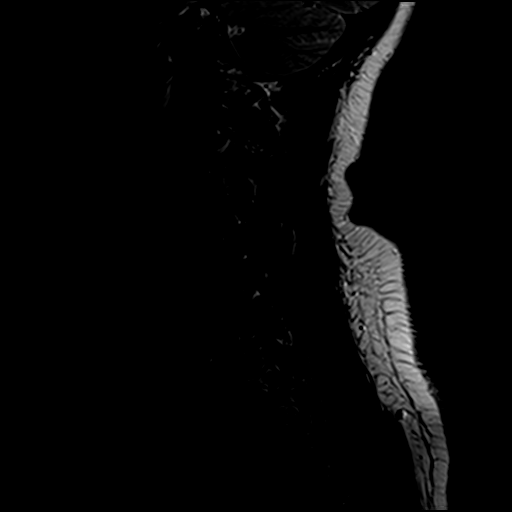
[im 5/13]
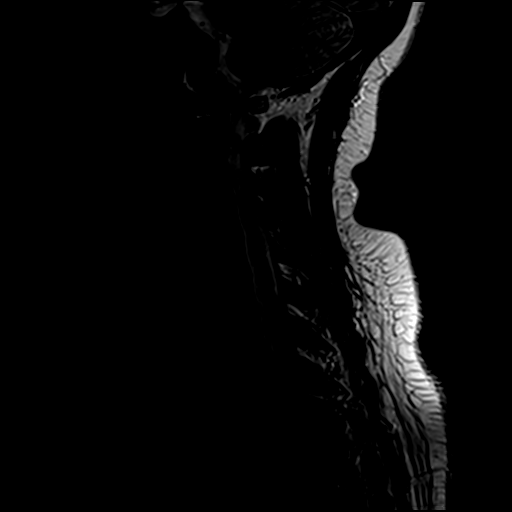
[im 9/13]
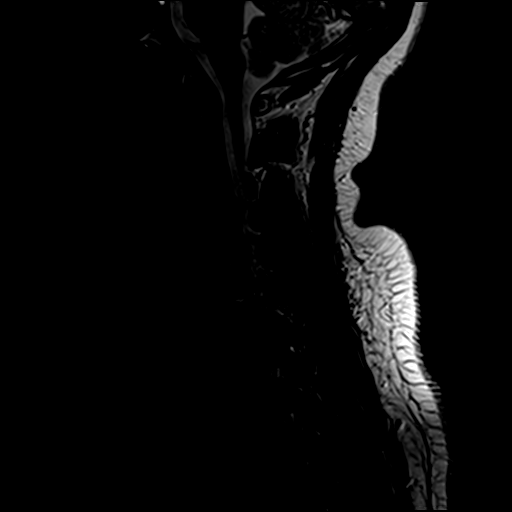
[im 13/13]
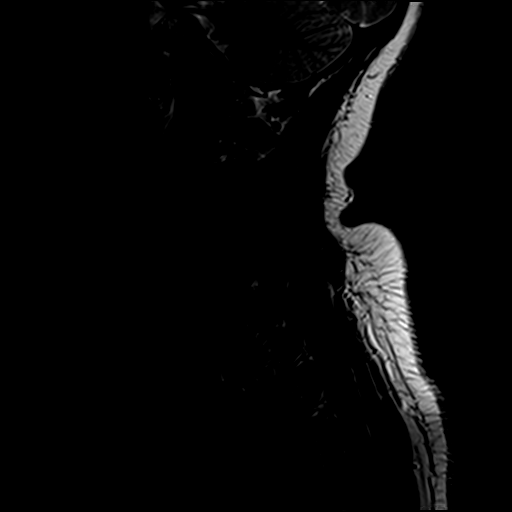

[Series 8: T1 fat-sat post-contrast · sagittal · 3.0mm · 1.09mm/px · 4 of 13 slices shown]
[im 1/13]
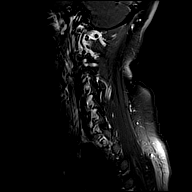
[im 5/13]
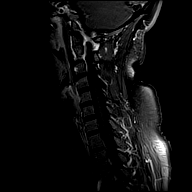
[im 9/13]
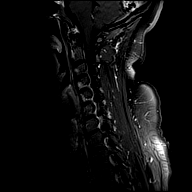
[im 13/13]
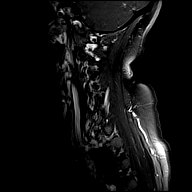

[29 of 48 positions shown; findings below may reference images not displayed]

FINDINGS: Alignment: Straightening of the cervical curvature.

Vertebrae: No fracture, evidence of discitis, or bone lesion.

Cord: Significant interval decrease in size of the T2 hyperintense
spinal cord lesion at the C2-C3 level on the right. This lesion no
longer shows evidence of contrast enhancement. No new lesion
identified.

Posterior Fossa, vertebral arteries, paraspinal tissues: T2
hyperintense lesion at the pontomedullary junction, best described
on MRI of the head performed concomitantly.

Disc levels:

No significant disc bulge or herniation, spinal canal or neural
foraminal stenosis at any level.
IMPRESSION: Significant interval decrease in size of the T2 hyperintense spinal
cord lesion at the C2-C3 level on the right. This lesion no longer
shows evidence of contrast enhancement. No new lesion identified.

## 2020-11-08 ENCOUNTER — Other Ambulatory Visit: Payer: Self-pay

## 2020-11-08 MED ORDER — GABAPENTIN 300 MG PO CAPS: ORAL_CAPSULE | ORAL | 1 refills | Status: DC

## 2020-11-08 NOTE — Telephone Encounter (Signed)
Pt called and rx sent in to pharmacy.

## 2020-11-08 NOTE — Telephone Encounter (Signed)
Patient called and said, "I'm having an allergic reaction to the increase in carbamazepine. I have a rash on my arms, lips and back."

## 2020-11-08 NOTE — Telephone Encounter (Signed)
Rash on face arm and arms. Just started today, no shortness of breathe, please advise on any recommendations, of adjusting dose of any of her meds.

## 2020-11-08 NOTE — Telephone Encounter (Signed)
Stop carbamazepine.  She may take Benadryl for the rash. Instead of carbamazepine, I would like you to send a prescription for gabapentin 100mg  three times daily for one week, then 200mg  three times daily

## 2020-11-25 ENCOUNTER — Other Ambulatory Visit: Payer: Self-pay

## 2020-11-25 DIAGNOSIS — G35 Multiple sclerosis: Secondary | ICD-10-CM

## 2020-11-25 MED ORDER — SODIUM CHLORIDE 0.9 % IV SOLN: 300.0000 mg | INTRAVENOUS | Status: DC

## 2020-11-25 MED ORDER — LORATADINE 10 MG PO TABS: 10.0000 mg | ORAL_TABLET | Freq: Once | ORAL | Status: AC

## 2020-11-25 MED ORDER — ACETAMINOPHEN ER 650 MG PO TBCR: 650.0000 mg | EXTENDED_RELEASE_TABLET | Freq: Once | ORAL | Status: AC

## 2020-11-28 ENCOUNTER — Encounter (HOSPITAL_COMMUNITY): Payer: Medicaid Other

## 2020-11-29 ENCOUNTER — Other Ambulatory Visit: Payer: Self-pay

## 2020-11-29 ENCOUNTER — Ambulatory Visit (HOSPITAL_COMMUNITY)
Admission: RE | Admit: 2020-11-29 | Discharge: 2020-11-29 | Disposition: A | Discharge status: Home / Self Care | Payer: Medicaid Other | Source: Ambulatory Visit | Attending: Internal Medicine | Admitting: Internal Medicine

## 2020-11-29 DIAGNOSIS — G35 Multiple sclerosis: Secondary | ICD-10-CM | POA: Diagnosis present

## 2020-11-29 MED ORDER — LORATADINE 10 MG PO TABS
10.0000 mg | ORAL_TABLET | Freq: Once | ORAL | Status: AC
  Administered 2020-11-29: 10 mg via ORAL
  Filled 2020-11-29: qty 1

## 2020-11-29 MED ORDER — SODIUM CHLORIDE 0.9 % IV SOLN
300.0000 mg | INTRAVENOUS | Status: DC
  Administered 2020-11-29: 300 mg via INTRAVENOUS
  Filled 2020-11-29: qty 15

## 2020-11-29 MED ORDER — ACETAMINOPHEN 325 MG PO TABS
650.0000 mg | ORAL_TABLET | Freq: Once | ORAL | Status: AC
  Administered 2020-11-29: 650 mg via ORAL
  Filled 2020-11-29: qty 2

## 2020-11-29 MED ORDER — SODIUM CHLORIDE 0.9 % IV SOLN
INTRAVENOUS | Status: DC | PRN
  Administered 2020-11-29: 250 mL via INTRAVENOUS

## 2020-11-29 NOTE — Progress Notes (Signed)
PATIENT CARE CENTER NOTE   Diagnosis: Multiple sclerosis (HCC) (G35)   Provider: Shon Millet, MD   Procedure: Tysabri infusion    Note: Patient received Tysabri infusion via PIV. Pre-medications given (Tylenol and Claritin) per order. Patient tolerated infusion well with no adverse reaction. Observed patient for 1 hour post-infusion. Vital signs stable. Printed AVS offered but patient refused. Patient to come back every 28 days for Tysabri infusions. Alert, oriented and ambulatory at discharge.

## 2020-11-29 NOTE — Discharge Instructions (Signed)
Natalizumab injection What is this medicine? NATALIZUMAB (na ta LIZ you mab) is used to treat relapsing multiple sclerosis. This drug is not a cure. It is also used to treat Crohn's disease. This medicine may be used for other purposes; ask your health care provider or pharmacist if you have questions. COMMON BRAND NAME(S): Tysabri What should I tell my health care provider before I take this medicine? They need to know if you have any of these conditions:  immune system problems  progressive multifocal leukoencephalopathy (PML)  an unusual or allergic reaction to natalizumab, other medicines, foods, dyes, or preservatives  pregnant or trying to get pregnant  breast-feeding How should I use this medicine? This medicine is for infusion into a vein. It is given by a health care professional in a hospital or clinic setting. A special MedGuide will be given to you by the pharmacist with each prescription and refill. Be sure to read this information carefully each time. Talk to your pediatrician regarding the use of this medicine in children. This medicine is not approved for use in children. Overdosage: If you think you have taken too much of this medicine contact a poison control center or emergency room at once. NOTE: This medicine is only for you. Do not share this medicine with others. What if I miss a dose? It is important not to miss your dose. Call your doctor or health care professional if you are unable to keep an appointment. What may interact with this medicine? Do not take this medicine with any of the following medications:  biologic medicines such as adalimumab, certolizumab, etanercept, golimumab, infliximab This medicine may also interact with the following medications:  azathioprine  cyclosporine  interferons  6-mercaptopurine  methotrexate  other medicines that lower your chance of fighting an infection  steroid medicines like prednisone or  cortisone  vaccines This list may not describe all possible interactions. Give your health care provider a list of all the medicines, herbs, non-prescription drugs, or dietary supplements you use. Also tell them if you smoke, drink alcohol, or use illegal drugs. Some items may interact with your medicine. What should I watch for while using this medicine? Your condition will be monitored carefully while you are receiving this medicine. Visit your doctor for regular check ups. Tell your doctor or healthcare professional if your symptoms do not start to get better or if they get worse. Stay away from people who are sick. Call your doctor or health care professional for advice if you get a fever, chills or sore throat, or other symptoms of a cold or flu. Do not treat yourself. In some patients, this medicine may cause a serious brain infection that may cause death. If you have any problems seeing, thinking, speaking, walking, or standing, tell your doctor right away. If you cannot reach your doctor, get urgent medical care. What side effects may I notice from receiving this medicine? Side effects that you should report to your doctor or health care professional as soon as possible:  allergic reactions like skin rash, itching or hives, swelling of the face, lips, or tongue  breathing problems  changes in vision  chest pain  confusion  depressed mood  dizziness  feeling faint; lightheaded; falls  general ill feeling or flu-like symptoms  loss of memory  missed menstrual periods  muscle weakness  problems with balance, talking, or walking  signs and symptoms of liver injury like dark yellow or brown urine; general ill feeling or flu-like symptoms; light-colored   stools; loss of appetite; nausea; right upper belly pain; unusually weak or tired; yellowing of the eyes or skin  suicidal thoughts, mood changes  unusual bruising or bleeding  unusually weak or tired Side effects that  usually do not require medical attention (report to your doctor or health care professional if they continue or are bothersome):  headache  joint pain  muscle cramps  muscle pain  nausea, vomiting  pain, redness, or irritation at site where injected  tiredness This list may not describe all possible side effects. Call your doctor for medical advice about side effects. You may report side effects to FDA at 1-800-FDA-1088. Where should I keep my medicine? This drug is given in a hospital or clinic and will not be stored at home. NOTE: This sheet is a summary. It may not cover all possible information. If you have questions about this medicine, talk to your doctor, pharmacist, or health care provider.  2021 Elsevier/Gold Standard (2019-05-04 13:20:26)  

## 2020-12-02 ENCOUNTER — Telehealth: Payer: Self-pay

## 2020-12-02 ENCOUNTER — Telehealth: Payer: Self-pay | Admitting: Neurology

## 2020-12-02 NOTE — Telephone Encounter (Signed)
Pt state she is already taking the 900 mg a day. Pt script sent in in 10/2020 was 300 mg TID. Please advise this is not helping with the pain now.

## 2020-12-02 NOTE — Telephone Encounter (Signed)
It looks like she is on gabapentin 200 mg tid for it?  Have her increase to 300 mg tid and see how she does.

## 2020-12-02 NOTE — Telephone Encounter (Signed)
Patient called and said her jaw pain has moved to the right side now. She explained further stating, "It's hurting really, really bad on the right side now. I need some help."

## 2020-12-02 NOTE — Telephone Encounter (Signed)
Pt states she had a allergic reaction to the Carbamazpine.

## 2020-12-02 NOTE — Telephone Encounter (Signed)
Patient returned call and requested a call back. She said, "It was not the right medicine. I need to speak with [name]." Patient declined to give more information until speaking with the nurse.

## 2020-12-02 NOTE — Telephone Encounter (Signed)
As long as not having side effects, there is still room to increase the gabapentin 300mg : Take 3 tabs TID, would monitor for drowsiness and not drive as she increases dose, see how she is feeling first. thanks

## 2020-12-02 NOTE — Telephone Encounter (Signed)
Patient notified of rx change. 

## 2020-12-02 NOTE — Telephone Encounter (Signed)
Telephone call to pt to make sure we are hearing her correctly,  Per pt in the morning she takes 2 300 mg afternoon she takes 2 300 mg, and in the evening she  Takes 2 300 mg cap.     Verbal order given by DR.Aqunio, Pt to take 3 300 mg cap TID. Pt is to monitor for drowniss and no driving when she first take the medication.   Pt verbalized understanding.

## 2020-12-02 NOTE — Telephone Encounter (Signed)
Spoke to the pt, states she was doing okay after her visit in December. But now the pain she is having is on the right side of her jaw, and the radiating to the ear.     Please advise what the patient should do.

## 2020-12-02 NOTE — Telephone Encounter (Signed)
Pls have her increase the carbamazepine XR 200mg : Take 1 tab in AM, 2 tabs in PM for 3 days, then 2 tabs twice a day. Pls send Rx for 2 tabs BID. Thanks

## 2020-12-02 NOTE — Telephone Encounter (Signed)
Patient called back and stated to clarify Rx she is taken 2 300mg  tid already please readvise.

## 2020-12-02 NOTE — Telephone Encounter (Signed)
Pls see other phone note and clarify again with patient, she said on phone note she is taking 2 300mg  TID. If she is taking 300mg  TID, pls increase to 2 tabs TID. Monitor for drowsiness as she increases, would avoid driving as she first increases.

## 2020-12-02 NOTE — Telephone Encounter (Signed)
Left message of the above at [phone].

## 2020-12-02 NOTE — Telephone Encounter (Signed)
Left message to call office back at [phone]

## 2020-12-06 ENCOUNTER — Other Ambulatory Visit: Payer: Self-pay | Admitting: Neurology

## 2020-12-06 ENCOUNTER — Telehealth: Payer: Self-pay | Admitting: Neurology

## 2020-12-06 MED ORDER — GABAPENTIN 300 MG PO CAPS: 300.0000 mg | ORAL_CAPSULE | Freq: Three times a day (TID) | ORAL | 5 refills | Status: DC

## 2020-12-06 NOTE — Telephone Encounter (Signed)
Script sent to [facility].  Instead of prescribing gabapentin 100mg  capsules, I prescribed 300mg  capsule, so she just needs to take 1 capsule twice daily.

## 2020-12-06 NOTE — Telephone Encounter (Signed)
Patient called in stating she needs a refill of her gabapentin sent in.

## 2020-12-07 NOTE — Telephone Encounter (Signed)
Pt advised of script sent to pharmacy.

## 2020-12-19 ENCOUNTER — Telehealth: Payer: Self-pay

## 2020-12-19 ENCOUNTER — Other Ambulatory Visit: Payer: Self-pay | Admitting: Neurology

## 2020-12-19 ENCOUNTER — Telehealth: Payer: Self-pay | Admitting: Neurology

## 2020-12-19 MED ORDER — GABAPENTIN 300 MG PO CAPS: 900.0000 mg | ORAL_CAPSULE | Freq: Three times a day (TID) | ORAL | 5 refills | Status: DC

## 2020-12-19 NOTE — Telephone Encounter (Signed)
Patient called in stating she was told to call in whenever she needed a refill on her gabapentin. She says the pharmacy tells her it's too early, but her dosage was changed.

## 2020-12-19 NOTE — Telephone Encounter (Signed)
Clarified:  She is taking 900mg  three times daily (sent prescription for 300mg  capsule for 3 capsules three times daily)

## 2020-12-19 NOTE — Telephone Encounter (Signed)
Sent to [facility] for review.

## 2020-12-19 NOTE — Telephone Encounter (Signed)
Okay to fill rx cause Rx was changed. So is taken 300mg  bid.

## 2020-12-27 ENCOUNTER — Other Ambulatory Visit: Payer: Self-pay

## 2020-12-27 ENCOUNTER — Non-Acute Institutional Stay (HOSPITAL_COMMUNITY)
Admission: RE | Admit: 2020-12-27 | Discharge: 2020-12-27 | Disposition: A | Discharge status: Home / Self Care | Payer: Medicaid Other | Source: Ambulatory Visit | Attending: Internal Medicine | Admitting: Internal Medicine

## 2020-12-27 DIAGNOSIS — G35 Multiple sclerosis: Secondary | ICD-10-CM | POA: Diagnosis not present

## 2020-12-27 MED ORDER — SODIUM CHLORIDE 0.9 % IV SOLN
300.0000 mg | INTRAVENOUS | Status: DC
  Administered 2020-12-27: 300 mg via INTRAVENOUS
  Filled 2020-12-27: qty 15

## 2020-12-27 MED ORDER — SODIUM CHLORIDE 0.9 % IV SOLN
INTRAVENOUS | Status: DC | PRN
  Administered 2020-12-27: 250 mL via INTRAVENOUS

## 2020-12-27 MED ORDER — LORATADINE 10 MG PO TABS
10.0000 mg | ORAL_TABLET | Freq: Every day | ORAL | Status: DC
  Administered 2020-12-27: 10 mg via ORAL
  Filled 2020-12-27: qty 1

## 2020-12-27 MED ORDER — ACETAMINOPHEN 325 MG PO TABS
650.0000 mg | ORAL_TABLET | Freq: Once | ORAL | Status: AC
  Administered 2020-12-27: 650 mg via ORAL
  Filled 2020-12-27: qty 2

## 2020-12-27 NOTE — Discharge Instructions (Signed)
Natalizumab injection What is this medicine? NATALIZUMAB (na ta LIZ you mab) is used to treat relapsing multiple sclerosis. This drug is not a cure. It is also used to treat Crohn's disease. This medicine may be used for other purposes; ask your health care provider or pharmacist if you have questions. COMMON BRAND NAME(S): Tysabri What should I tell my health care provider before I take this medicine? They need to know if you have any of these conditions:  immune system problems  progressive multifocal leukoencephalopathy (PML)  an unusual or allergic reaction to natalizumab, other medicines, foods, dyes, or preservatives  pregnant or trying to get pregnant  breast-feeding How should I use this medicine? This medicine is for infusion into a vein. It is given by a health care professional in a hospital or clinic setting. A special MedGuide will be given to you by the pharmacist with each prescription and refill. Be sure to read this information carefully each time. Talk to your pediatrician regarding the use of this medicine in children. This medicine is not approved for use in children. Overdosage: If you think you have taken too much of this medicine contact a poison control center or emergency room at once. NOTE: This medicine is only for you. Do not share this medicine with others. What if I miss a dose? It is important not to miss your dose. Call your doctor or health care professional if you are unable to keep an appointment. What may interact with this medicine? Do not take this medicine with any of the following medications:  biologic medicines such as adalimumab, certolizumab, etanercept, golimumab, infliximab This medicine may also interact with the following medications:  azathioprine  cyclosporine  interferons  6-mercaptopurine  methotrexate  other medicines that lower your chance of fighting an infection  steroid medicines like prednisone or  cortisone  vaccines This list may not describe all possible interactions. Give your health care provider a list of all the medicines, herbs, non-prescription drugs, or dietary supplements you use. Also tell them if you smoke, drink alcohol, or use illegal drugs. Some items may interact with your medicine. What should I watch for while using this medicine? Your condition will be monitored carefully while you are receiving this medicine. Visit your doctor for regular check ups. Tell your doctor or healthcare professional if your symptoms do not start to get better or if they get worse. Stay away from people who are sick. Call your doctor or health care professional for advice if you get a fever, chills or sore throat, or other symptoms of a cold or flu. Do not treat yourself. In some patients, this medicine may cause a serious brain infection that may cause death. If you have any problems seeing, thinking, speaking, walking, or standing, tell your doctor right away. If you cannot reach your doctor, get urgent medical care. What side effects may I notice from receiving this medicine? Side effects that you should report to your doctor or health care professional as soon as possible:  allergic reactions like skin rash, itching or hives, swelling of the face, lips, or tongue  breathing problems  changes in vision  chest pain  confusion  depressed mood  dizziness  feeling faint; lightheaded; falls  general ill feeling or flu-like symptoms  loss of memory  missed menstrual periods  muscle weakness  problems with balance, talking, or walking  signs and symptoms of liver injury like dark yellow or brown urine; general ill feeling or flu-like symptoms; light-colored   stools; loss of appetite; nausea; right upper belly pain; unusually weak or tired; yellowing of the eyes or skin  suicidal thoughts, mood changes  unusual bruising or bleeding  unusually weak or tired Side effects that  usually do not require medical attention (report to your doctor or health care professional if they continue or are bothersome):  headache  joint pain  muscle cramps  muscle pain  nausea, vomiting  pain, redness, or irritation at site where injected  tiredness This list may not describe all possible side effects. Call your doctor for medical advice about side effects. You may report side effects to FDA at 1-800-FDA-1088. Where should I keep my medicine? This drug is given in a hospital or clinic and will not be stored at home. NOTE: This sheet is a summary. It may not cover all possible information. If you have questions about this medicine, talk to your doctor, pharmacist, or health care provider.  2021 Elsevier/Gold Standard (2019-05-04 13:20:26)  

## 2020-12-27 NOTE — Progress Notes (Signed)
CENTER NOTE   Diagnosis: Multiple Sclerosis       Provider: Shon Millet MD     Procedure: Donnamarie Poag IV     Note: Patient received Tysabri infusion via PIV. PO premeds Tylenol and Claritin given prior to infusion. Tolerated infusion well with no adverse reaction. Vital signs stable. Declined AVS instructions. Patient declined to stay for the 1 hour post-infusion observation. Alert, oriented and ambulatory at discharge.

## 2021-01-16 ENCOUNTER — Encounter: Payer: Self-pay | Admitting: Neurology

## 2021-01-16 NOTE — Progress Notes (Signed)
Received fax approval for the Tysabri valid from 01/16/21 to 08/15/21. Patient enrollment #: [id number].

## 2021-01-20 ENCOUNTER — Encounter (HOSPITAL_COMMUNITY): Payer: Medicaid Other

## 2021-01-23 NOTE — Progress Notes (Addendum)
NEUROLOGY FOLLOW UP OFFICE NOTE  [name] [id number]  Assessment/Plan:   1.  Multiple sclerosis 2.  Left-sided trigeminal neuralgia  1.  In addition to gabapentin 900mg  three times daily, will start baclofen titrating to 10mg  three times daily - cautioned about drowsiness.  If ineffective, we can transition from gabapentin to lamotrigine 2.  For breakthrough pain, tramadol 3.  Tysabri 4.  D3 5000 IU daily 5.  Check CBC w/diff, CMP, Vit D and JC Virus antibody with index today and again in 6 months 6.  Repeat MRI of brain and cervical spine with and without contrast in 6 months. 7.  Follow up in 6 months (after repeat labs and MRI)  Subjective:  . [name] is a 30 year oldright-handed black woman who follows up for multiple sclerosis and left-sided trigeminal neuralgia.  [place]  UPDATE: Current DMT: Tysabri Other medications:gabapentin 900mg  TID, D3 5000 IU daily.  Due to rash, Tegretol was switched to gabapentin in December.    Vision:No issues Motor: No issues Sensory: Sometimes has numbness and tingling in hands and feet Pain: Left-sided trigeminal neuralgia.  It was okay for a while but started back again on Friday.  Now it occurs 3 times a day for 20 minutes. 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    HISTORY: She presented to [facility] ED on 01/10/2020 for evaluation of numbness and tingling of the left side of her body involving arm, torso and leg but not her face. She also endorsed some mild left arm weakness as well. Symptoms started 4 days prior to evaluation. MRI of brain and cervical spine with and without contrast showed a 12 mm hyperintense T2/FLAIR focus within the left medial parietal lobe as well as two  other foci within the frontoparietal vertex subcortical white matter. Contrast enhancement noted in the medial parietal lesion. An enhancing lesion was also noted within the right side of the cervical cord at the C2-C3 levels. Hospital admission for acute treatment was recommended but patient left against medical advice. Since then, she has developed intermittent blurred vision and pressure behind her eyes. Sometimes her right foot feels numb. She has pain in her left hand, which makes lifting objects difficult. On occasion, she reports word-finding difficulty.  Seen by me in initial consultation in March 2021. Due to ongoing symptoms including new visual disturbance, she underwent another round of SoluMedrol 1g daily for 3 days. Given the severity of her symptoms and active cervical cord lesion on initial clinical flare up, it was decided to start with Tysabri.. She saw ophthalmology in April. Exam demonstrated no optic neurlitis. However, she never started Tysabri as Medicaid would not approve it. Thus, she has had a physical decline off of DMT. She reported worsening bilateral numbness in hands and feet in late April 2021. She was seen in the ED on 5/8 where she was given a course of high-dose prednisone.   No family historyof MS.  01/21/2020 LABS: JC Virus Index 0.22, antibody by inhibition negatve; Quantiferon-TB Gold negative; Hep B negative; vitamin D 9; NMO IgG negative  Imaging: 01/10/2020 MRI BRAIN/CERVICAL SPINE W/WO:  12 mm hyperintense T2/FLAIR focus within the left medial parietal lobe as well as two other foci within the frontoparietal vertex  subcortical white matter. Contrast enhancement noted in the medial parietal lesion. An enhancing lesion was also noted within the right side of the cervical cord at the C2-C3 levels. 08/19/2020 MRI BRAIN W/WO:  new T2 hyperintense lesion within the pontomedullary junction without contrast enhancement as well as previous T2 focus within  the left centrum semiovale/posterior frontal region no longer with contrast enhancement, and stable juxtacortical T2 lesions in the left frontoparietal vertex, as well as partial empty sella.  08/19/2020 MRI CERVICAL SPINE W/WO:   significant decrease in size of T2 lesion at C2-C3 level on right without enhancement and no new lesions.   08/22/2020 MRI THORACIC SPINE W/WO:  Unremarkable.  Past medications:  Tegretol (rash)  PAST MEDICAL HISTORY: Past Medical History:  Diagnosis Date  . GERD (gastroesophageal reflux disease)    with pregnancy   . Headache   . Multiple sclerosis (HCC)     MEDICATIONS: Current Outpatient Medications on File Prior to Visit  Medication Sig Dispense Refill  . acetaminophen (TYLENOL) 500 MG tablet Take 1,000 mg by mouth every 6 (six) hours as needed for mild pain or headache.  (Patient not taking: No sig reported)    . carbamazepine (TEGRETOL-XR) 200 MG 12 hr tablet Take 1 tablet (200 mg total) by mouth 2 (two) times daily. 60 tablet 5  . Cholecalciferol 100 MCG (4000 UT) CAPS Take 1 capsule (4,000 Units total) by mouth daily. (Patient not taking: No sig reported) 30 capsule   . gabapentin (NEURONTIN) 300 MG capsule Take 3 capsules (900 mg total) by mouth 3 (three) times daily. 270 capsule 5  . ibuprofen (ADVIL,MOTRIN) 800 MG tablet Take 1 tablet (800 mg total) by mouth every 8 (eight) hours. (Patient not taking: No sig reported) 30 tablet 0  . oxyCODONE (OXY IR/ROXICODONE) 5 MG immediate release tablet Take 1 tablet (5 mg total) by mouth every 4 (four) hours as needed (pain scale 4-7). (Patient not taking: No sig reported) 30 tablet 0   Current Facility-Administered Medications on File Prior to Visit  Medication Dose Route Frequency Provider Last Rate Last Admin  . acetaminophen (TYLENOL) CR tablet 650 mg  650 mg Oral Once Drema Dallas, DO      . loratadine (CLARITIN) tablet 10 mg  10 mg Oral Once Gwendolyn Call R, DO      . natalizumab (TYSABRI) 300 mg in sodium  chloride 0.9 % 100 mL IVPB  300 mg Intravenous Q28 days Shon Millet R, DO        ALLERGIES: No Known Allergies  FAMILY HISTORY: Family History  Problem Relation Age of Onset  . Hypertension Mother   . Diabetes Maternal Grandmother       Objective:  Blood pressure 130/83, pulse 85, height 5\' 3"  (1.6 m), weight 154 lb (69.9 kg), SpO2 99 %, unknown if currently breastfeeding. General: No acute distress.  Patient appears well-groomed.   Head:  Normocephalic/atraumatic Eyes:  Fundi examined but not visualized Neck: supple, no paraspinal tenderness, full range of motion Heart:  Regular rate and rhythm Lungs:  Clear to auscultation bilaterally Back: No paraspinal tenderness Neurological Exam: alert and oriented to person, place, and time. Attention span and concentration intact, recent and remote memory intact, fund of knowledge intact.  Speech fluent and not dysarthric, language intact.  CN II-XII intact. Bulk and tone normal, muscle strength 5/5 throughout.  Sensation to pinprick and vibration intact.  Deep tendon reflexes 2+ throughout, toes downgoing.  Finger to nose and heel to shin testing intact.  Gait normal, able to tandem walk.  Romberg negative.     Shon Millet, DO

## 2021-01-25 ENCOUNTER — Other Ambulatory Visit: Payer: Self-pay

## 2021-01-25 ENCOUNTER — Encounter: Payer: Self-pay | Admitting: Neurology

## 2021-01-25 ENCOUNTER — Ambulatory Visit (INDEPENDENT_AMBULATORY_CARE_PROVIDER_SITE_OTHER): Payer: Medicaid Other | Admitting: Neurology

## 2021-01-25 ENCOUNTER — Other Ambulatory Visit (INDEPENDENT_AMBULATORY_CARE_PROVIDER_SITE_OTHER): Payer: Medicaid Other

## 2021-01-25 VITALS — BP 130/83 | HR 85 | Ht 63.0 in | Wt 154.0 lb

## 2021-01-25 DIAGNOSIS — G35 Multiple sclerosis: Secondary | ICD-10-CM

## 2021-01-25 DIAGNOSIS — G500 Trigeminal neuralgia: Secondary | ICD-10-CM | POA: Diagnosis not present

## 2021-01-25 LAB — CBC WITH DIFFERENTIAL/PLATELET
Basophils Absolute: 0.1 10*3/uL (ref 0.0–0.1)
Basophils Relative: 1.4 % (ref 0.0–3.0)
Eosinophils Absolute: 0.1 10*3/uL (ref 0.0–0.7)
Eosinophils Relative: 1.1 % (ref 0.0–5.0)
HCT: 34.8 % — ABNORMAL LOW (ref 36.0–46.0)
Hemoglobin: 11.8 g/dL — ABNORMAL LOW (ref 12.0–15.0)
Lymphocytes Relative: 53.5 % — ABNORMAL HIGH (ref 12.0–46.0)
Lymphs Abs: 3.3 10*3/uL (ref 0.7–4.0)
MCHC: 34 g/dL (ref 30.0–36.0)
MCV: 86.4 fl (ref 78.0–100.0)
Monocytes Absolute: 0.3 10*3/uL (ref 0.1–1.0)
Monocytes Relative: 4.4 % (ref 3.0–12.0)
Neutro Abs: 2.5 10*3/uL (ref 1.4–7.7)
Neutrophils Relative %: 39.6 % — ABNORMAL LOW (ref 43.0–77.0)
Platelets: 251 10*3/uL (ref 150.0–400.0)
RBC: 4.02 Mil/uL (ref 3.87–5.11)
RDW: 12.9 % (ref 11.5–15.5)
WBC: 6.2 10*3/uL (ref 4.0–10.5)

## 2021-01-25 LAB — VITAMIN D 25 HYDROXY (VIT D DEFICIENCY, FRACTURES): VITD: 24.45 ng/mL — ABNORMAL LOW (ref 30.00–100.00)

## 2021-01-25 LAB — COMPREHENSIVE METABOLIC PANEL
ALT: 13 U/L (ref 0–35)
AST: 16 U/L (ref 0–37)
Albumin: 4.2 g/dL (ref 3.5–5.2)
Alkaline Phosphatase: 36 U/L — ABNORMAL LOW (ref 39–117)
BUN: 10 mg/dL (ref 6–23)
CO2: 25 mEq/L (ref 19–32)
Calcium: 9.2 mg/dL (ref 8.4–10.5)
Chloride: 105 mEq/L (ref 96–112)
Creatinine, Ser: 0.62 mg/dL (ref 0.40–1.20)
GFR: 119.83 mL/min (ref >60.00)
Glucose, Bld: 83 mg/dL (ref 70–99)
Potassium: 4.2 mEq/L (ref 3.5–5.1)
Sodium: 136 mEq/L (ref 135–145)
Total Bilirubin: 0.3 mg/dL (ref 0.2–1.2)
Total Protein: 7.1 g/dL (ref 6.0–8.3)

## 2021-01-25 MED ORDER — TRAMADOL HCL 50 MG PO TABS: 50.0000 mg | ORAL_TABLET | Freq: Four times a day (QID) | ORAL | 0 refills | Status: DC | PRN

## 2021-01-25 MED ORDER — BACLOFEN 10 MG PO TABS
ORAL_TABLET | ORAL | 0 refills | Status: DC
Start: 2021-01-25 — End: 2021-03-02

## 2021-01-25 NOTE — Patient Instructions (Addendum)
1. Continue gabapentin 900mg  three times daily 2.  Start baclofen - take as directed.  Contact me for refill and update 3.  For breakthrough pain, may take tramadol.  Take sparingly. 4.  Continue Tysabri and D3 5.  Check CBC with diff, CMP, vit D, JC Virus antibody with index now and again in 6 months. Your provider has requested that you have labwork completed today. Please go to [facility] ([id number]) on the second floor of this building before leaving the office today. You do not need to check in. If you are not called within 15 minutes please check with the front desk.  6.  Repeat MRI brain and cervical spine with and without contrast in 6 months. We have sent a referral to [facility] for your MRI and they will call you directly to schedule your appointment. They are located at [address] [facility]. If you need to contact them directly please call [phone].  7.  Follow up in 6 months after repeat MRI and labs

## 2021-01-26 NOTE — Progress Notes (Signed)
Pt advise of her lab results.

## 2021-01-31 ENCOUNTER — Other Ambulatory Visit: Payer: Self-pay

## 2021-01-31 ENCOUNTER — Telehealth: Payer: Self-pay | Admitting: Neurology

## 2021-01-31 ENCOUNTER — Non-Acute Institutional Stay (HOSPITAL_COMMUNITY)
Admission: RE | Admit: 2021-01-31 | Discharge: 2021-01-31 | Disposition: A | Discharge status: Home / Self Care | Payer: Medicaid Other | Source: Ambulatory Visit | Attending: Internal Medicine | Admitting: Internal Medicine

## 2021-01-31 DIAGNOSIS — G35 Multiple sclerosis: Secondary | ICD-10-CM | POA: Diagnosis present

## 2021-01-31 MED ORDER — LORATADINE 10 MG PO TABS: 10.0000 mg | ORAL_TABLET | Freq: Every day | ORAL | Status: DC

## 2021-01-31 MED ORDER — SODIUM CHLORIDE 0.9 % IV SOLN
INTRAVENOUS | Status: DC | PRN
  Administered 2021-01-31: 250 mL via INTRAVENOUS

## 2021-01-31 MED ORDER — LORATADINE 10 MG PO TABS
10.0000 mg | ORAL_TABLET | Freq: Once | ORAL | Status: AC
  Administered 2021-01-31: 10 mg via ORAL
  Filled 2021-01-31: qty 1

## 2021-01-31 MED ORDER — SODIUM CHLORIDE 0.9 % IV SOLN
300.0000 mg | INTRAVENOUS | Status: DC
  Administered 2021-01-31: 300 mg via INTRAVENOUS
  Filled 2021-01-31: qty 15

## 2021-01-31 MED ORDER — ACETAMINOPHEN 325 MG PO TABS
650.0000 mg | ORAL_TABLET | Freq: Once | ORAL | Status: AC
  Administered 2021-01-31: 650 mg via ORAL
  Filled 2021-01-31: qty 2

## 2021-01-31 NOTE — Telephone Encounter (Signed)
Sent to [facility]

## 2021-01-31 NOTE — Telephone Encounter (Signed)
Dr. Everlena Cooper gave her a prescription just last week and said to use sparingly. He started Baclofen to help instead. Too soon to refill Tramadol, would wait for Dr. Everlena Cooper if early refill okay. Thanks

## 2021-01-31 NOTE — Telephone Encounter (Signed)
Patient called in stating she needs a refill on her Tramadol

## 2021-01-31 NOTE — Progress Notes (Signed)
CENTER NOTE   Diagnosis: Multiple Sclerosis       Provider: Shon Millet  DO     Procedure: Tysabri IV     Note: Patient received Tysabri infusion via PIV. Tolerated infusion well with no adverse reaction. Vital signs stable. Pt declined AVS. Patient declined to stay for the 1 hour post-infusion observation. Pt instructed to schedule next infusion appointment at front desk before leaving, verbalized understanding. Alert, oriented and ambulatory at discharge.

## 2021-01-31 NOTE — Discharge Instructions (Signed)
Natalizumab injection What is this medicine? NATALIZUMAB (na ta LIZ you mab) is used to treat relapsing multiple sclerosis. This drug is not a cure. It is also used to treat Crohn's disease. This medicine may be used for other purposes; ask your health care provider or pharmacist if you have questions. COMMON BRAND NAME(S): Tysabri What should I tell my health care provider before I take this medicine? They need to know if you have any of these conditions:  immune system problems  progressive multifocal leukoencephalopathy (PML)  an unusual or allergic reaction to natalizumab, other medicines, foods, dyes, or preservatives  pregnant or trying to get pregnant  breast-feeding How should I use this medicine? This medicine is for infusion into a vein. It is given by a health care professional in a hospital or clinic setting. A special MedGuide will be given to you by the pharmacist with each prescription and refill. Be sure to read this information carefully each time. Talk to your pediatrician regarding the use of this medicine in children. This medicine is not approved for use in children. Overdosage: If you think you have taken too much of this medicine contact a poison control center or emergency room at once. NOTE: This medicine is only for you. Do not share this medicine with others. What if I miss a dose? It is important not to miss your dose. Call your doctor or health care professional if you are unable to keep an appointment. What may interact with this medicine? Do not take this medicine with any of the following medications:  biologic medicines such as adalimumab, certolizumab, etanercept, golimumab, infliximab This medicine may also interact with the following medications:  azathioprine  cyclosporine  interferons  6-mercaptopurine  methotrexate  other medicines that lower your chance of fighting an infection  steroid medicines like prednisone or  cortisone  vaccines This list may not describe all possible interactions. Give your health care provider a list of all the medicines, herbs, non-prescription drugs, or dietary supplements you use. Also tell them if you smoke, drink alcohol, or use illegal drugs. Some items may interact with your medicine. What should I watch for while using this medicine? Your condition will be monitored carefully while you are receiving this medicine. Visit your doctor for regular check ups. Tell your doctor or healthcare professional if your symptoms do not start to get better or if they get worse. Stay away from people who are sick. Call your doctor or health care professional for advice if you get a fever, chills or sore throat, or other symptoms of a cold or flu. Do not treat yourself. In some patients, this medicine may cause a serious brain infection that may cause death. If you have any problems seeing, thinking, speaking, walking, or standing, tell your doctor right away. If you cannot reach your doctor, get urgent medical care. What side effects may I notice from receiving this medicine? Side effects that you should report to your doctor or health care professional as soon as possible:  allergic reactions like skin rash, itching or hives, swelling of the face, lips, or tongue  breathing problems  changes in vision  chest pain  confusion  depressed mood  dizziness  feeling faint; lightheaded; falls  general ill feeling or flu-like symptoms  loss of memory  missed menstrual periods  muscle weakness  problems with balance, talking, or walking  signs and symptoms of liver injury like dark yellow or brown urine; general ill feeling or flu-like symptoms; light-colored   stools; loss of appetite; nausea; right upper belly pain; unusually weak or tired; yellowing of the eyes or skin  suicidal thoughts, mood changes  unusual bruising or bleeding  unusually weak or tired Side effects that  usually do not require medical attention (report to your doctor or health care professional if they continue or are bothersome):  headache  joint pain  muscle cramps  muscle pain  nausea, vomiting  pain, redness, or irritation at site where injected  tiredness This list may not describe all possible side effects. Call your doctor for medical advice about side effects. You may report side effects to FDA at 1-800-FDA-1088. Where should I keep my medicine? This drug is given in a hospital or clinic and will not be stored at home. NOTE: This sheet is a summary. It may not cover all possible information. If you have questions about this medicine, talk to your doctor, pharmacist, or health care provider.  2021 Elsevier/Gold Standard (2019-05-04 13:20:26)  

## 2021-02-01 ENCOUNTER — Other Ambulatory Visit: Payer: Self-pay

## 2021-02-01 NOTE — Telephone Encounter (Signed)
Patient was calling back to find out about her medication being called in

## 2021-02-01 NOTE — Telephone Encounter (Signed)
No answer, rx is denied.

## 2021-02-01 NOTE — Telephone Encounter (Signed)
I refused the refill request yesterday because Dr. Everlena Cooper just prescribed 14 last week and instructed to use sparingly. He gave her Baclofen. Would wait until Dr. Everlena Cooper returns if Tramadol early refills are okay. Thanks

## 2021-02-01 NOTE — Progress Notes (Signed)
See other phone note

## 2021-02-01 NOTE — Telephone Encounter (Signed)
Patient requested refill on toradol. Please see request I will pend it.

## 2021-02-02 NOTE — Telephone Encounter (Signed)
Pt called back she was informed to continue taken the baclofen that was prescribed by Dr Willy Eddy, and to call back next Wed or Thursday to let Dr Everlena Cooper know how she is doing to give the medication a little longer to work. Pt verbalized understanding,

## 2021-02-03 LAB — STRATIFY JCV AB (W/ INDEX) W/ RFLX
Index Value: 0.22 — ABNORMAL HIGH
Stratify JCV (TM) Ab w/Reflex Inhibition: UNDETERMINED — AB

## 2021-02-03 LAB — RFLX STRATIFY JCV (TM) AB INHIBITION: JCV Antibody by Inhibition: NEGATIVE

## 2021-03-02 ENCOUNTER — Other Ambulatory Visit: Payer: Self-pay | Admitting: Neurology

## 2021-03-02 NOTE — Telephone Encounter (Signed)
Patient called in requesting a refill of her Baclofen

## 2021-03-03 ENCOUNTER — Non-Acute Institutional Stay (HOSPITAL_COMMUNITY)
Admission: RE | Admit: 2021-03-03 | Discharge: 2021-03-03 | Disposition: A | Discharge status: Home / Self Care | Payer: Medicaid Other | Source: Ambulatory Visit | Attending: Internal Medicine | Admitting: Internal Medicine

## 2021-03-03 ENCOUNTER — Other Ambulatory Visit: Payer: Self-pay

## 2021-03-03 DIAGNOSIS — G35 Multiple sclerosis: Secondary | ICD-10-CM | POA: Insufficient documentation

## 2021-03-03 MED ORDER — LORATADINE 10 MG PO TABS
10.0000 mg | ORAL_TABLET | Freq: Every day | ORAL | Status: DC
  Administered 2021-03-03: 10 mg via ORAL
  Filled 2021-03-03: qty 1

## 2021-03-03 MED ORDER — ACETAMINOPHEN 325 MG PO TABS
650.0000 mg | ORAL_TABLET | Freq: Once | ORAL | Status: AC
  Administered 2021-03-03: 650 mg via ORAL
  Filled 2021-03-03: qty 2

## 2021-03-03 MED ORDER — SODIUM CHLORIDE 0.9 % IV SOLN
300.0000 mg | INTRAVENOUS | Status: DC
  Administered 2021-03-03: 300 mg via INTRAVENOUS
  Filled 2021-03-03: qty 15

## 2021-03-03 MED ORDER — SODIUM CHLORIDE 0.9 % IV SOLN
INTRAVENOUS | Status: DC | PRN
  Administered 2021-03-03: 250 mL via INTRAVENOUS

## 2021-03-03 NOTE — Progress Notes (Signed)
PATIENT CARE CENTER NOTE    Diagnosis:Multiple Sclerosis   Provider: Shon Millet  DO   Procedure:Tysabri IV   Note:Patient received Tysabri infusionvia PIV. Tolerated infusion well with no adverse reaction. Vital signs stable. Pt declined AVS. Patientdeclinedto stay for the1 hour post-infusionobservation. Pt instructed to schedule next infusion appointment at front desk before leaving, verbalized understanding. Alert, oriented and ambulatory at discharge.

## 2021-03-06 ENCOUNTER — Other Ambulatory Visit: Payer: Medicaid Other

## 2021-03-14 ENCOUNTER — Other Ambulatory Visit: Payer: Self-pay

## 2021-03-14 ENCOUNTER — Ambulatory Visit
Admission: RE | Admit: 2021-03-14 | Discharge: 2021-03-14 | Disposition: A | Discharge status: Home / Self Care | Payer: Medicaid Other | Source: Ambulatory Visit | Attending: Neurology | Admitting: Neurology

## 2021-03-14 DIAGNOSIS — G500 Trigeminal neuralgia: Secondary | ICD-10-CM

## 2021-03-14 DIAGNOSIS — G35 Multiple sclerosis: Secondary | ICD-10-CM

## 2021-03-14 MED ORDER — GADOBENATE DIMEGLUMINE 529 MG/ML IV SOLN
14.0000 mL | Freq: Once | INTRAVENOUS | Status: AC | PRN
  Administered 2021-03-14: 14 mL via INTRAVENOUS

## 2021-03-17 ENCOUNTER — Other Ambulatory Visit: Payer: Self-pay

## 2021-03-17 DIAGNOSIS — G35 Multiple sclerosis: Secondary | ICD-10-CM

## 2021-03-23 NOTE — Progress Notes (Signed)
The procedure for the member's plan does not require preauthorization with [employer] at this time. If you have questions regarding this member's benefits or eligibility, please contact the health plan using the phone number on the back of the member's ID card.

## 2021-04-24 ENCOUNTER — Non-Acute Institutional Stay (HOSPITAL_COMMUNITY)
Admission: RE | Admit: 2021-04-24 | Discharge: 2021-04-24 | Disposition: A | Discharge status: Home / Self Care | Payer: BC Managed Care – PPO | Source: Ambulatory Visit | Attending: Internal Medicine | Admitting: Internal Medicine

## 2021-04-24 ENCOUNTER — Other Ambulatory Visit: Payer: Self-pay

## 2021-04-24 DIAGNOSIS — G35 Multiple sclerosis: Secondary | ICD-10-CM | POA: Insufficient documentation

## 2021-04-24 MED ORDER — SODIUM CHLORIDE 0.9 % IV SOLN
INTRAVENOUS | Status: DC | PRN
  Administered 2021-04-24: 12:00:00 250 mL via INTRAVENOUS

## 2021-04-24 MED ORDER — LORATADINE 10 MG PO TABS
10.0000 mg | ORAL_TABLET | Freq: Once | ORAL | Status: AC
  Administered 2021-04-24: 11:00:00 10 mg via ORAL
  Filled 2021-04-24: qty 1

## 2021-04-24 MED ORDER — SODIUM CHLORIDE 0.9 % IV SOLN
300.0000 mg | INTRAVENOUS | Status: DC
  Administered 2021-04-24: 12:00:00 300 mg via INTRAVENOUS
  Filled 2021-04-24: qty 15

## 2021-04-24 MED ORDER — ACETAMINOPHEN 325 MG PO TABS
650.0000 mg | ORAL_TABLET | Freq: Once | ORAL | Status: AC
  Administered 2021-04-24: 11:00:00 650 mg via ORAL
  Filled 2021-04-24: qty 2

## 2021-04-24 NOTE — Progress Notes (Signed)
PATIENT CARE CENTER NOTE       Diagnosis: Multiple Sclerosis       Provider: Shon Millet  DO     Procedure: Tysabri IV     Note: Patient received Tysabri infusion via PIV. Tolerated infusion well with no adverse reaction. Vital signs stable. Pt declined AVS. Patient declined to stay for the 1 hour post-infusion observation. Pt instructed to schedule next infusion appointment at front desk before leaving, verbalized understanding. Alert, oriented and ambulatory at discharge.

## 2021-05-22 ENCOUNTER — Inpatient Hospital Stay (HOSPITAL_COMMUNITY)
Admission: RE | Admit: 2021-05-22 | Discharge: 2021-05-22 | Disposition: A | Discharge status: Home / Self Care | Payer: Medicaid Other | Source: Ambulatory Visit

## 2021-06-05 ENCOUNTER — Non-Acute Institutional Stay (HOSPITAL_COMMUNITY)
Admission: RE | Admit: 2021-06-05 | Discharge: 2021-06-05 | Disposition: A | Discharge status: Home / Self Care | Payer: BC Managed Care – PPO | Source: Ambulatory Visit | Attending: Internal Medicine | Admitting: Internal Medicine

## 2021-06-05 ENCOUNTER — Other Ambulatory Visit: Payer: Self-pay

## 2021-06-05 DIAGNOSIS — G35 Multiple sclerosis: Secondary | ICD-10-CM | POA: Diagnosis not present

## 2021-06-05 MED ORDER — SODIUM CHLORIDE 0.9 % IV SOLN
INTRAVENOUS | Status: DC | PRN
  Administered 2021-06-05: 250 mL via INTRAVENOUS

## 2021-06-05 MED ORDER — SODIUM CHLORIDE 0.9 % IV SOLN
300.0000 mg | INTRAVENOUS | Status: DC
  Administered 2021-06-05: 300 mg via INTRAVENOUS
  Filled 2021-06-05: qty 15

## 2021-06-05 MED ORDER — ACETAMINOPHEN 325 MG PO TABS
650.0000 mg | ORAL_TABLET | Freq: Once | ORAL | Status: AC
  Administered 2021-06-05: 650 mg via ORAL
  Filled 2021-06-05: qty 2

## 2021-06-05 MED ORDER — LORATADINE 10 MG PO TABS
10.0000 mg | ORAL_TABLET | Freq: Once | ORAL | Status: AC
  Administered 2021-06-05: 10 mg via ORAL
  Filled 2021-06-05: qty 1

## 2021-06-05 NOTE — Progress Notes (Signed)
PATIENT CARE CENTER NOTE    Diagnosis:Multiple Sclerosis   Provider: Adam Jaffe  DO   Procedure:Tysabri IV   Note:Patient received Tysabri infusionvia PIV. Tolerated infusion well with no adverse reaction. Vital signs stable. Pt declined AVS. Patientdeclinedto stay for the1 hour post-infusionobservation. Pt instructed to schedule next infusion appointment at front desk before leaving, verbalized understanding. Alert, oriented and ambulatory at discharge.           

## 2021-06-24 ENCOUNTER — Other Ambulatory Visit: Payer: Self-pay | Admitting: Neurology

## 2021-06-27 ENCOUNTER — Encounter (HOSPITAL_COMMUNITY): Payer: Self-pay

## 2021-06-27 ENCOUNTER — Emergency Department (HOSPITAL_COMMUNITY)
Admission: EM | Admit: 2021-06-27 | Discharge: 2021-06-27 | Disposition: A | Discharge status: Home / Self Care | Payer: BC Managed Care – PPO | Attending: Student | Admitting: Student

## 2021-06-27 DIAGNOSIS — F17210 Nicotine dependence, cigarettes, uncomplicated: Secondary | ICD-10-CM | POA: Diagnosis not present

## 2021-06-27 DIAGNOSIS — R21 Rash and other nonspecific skin eruption: Secondary | ICD-10-CM

## 2021-06-27 MED ORDER — PREDNISONE 10 MG PO TABS: 20.0000 mg | ORAL_TABLET | Freq: Every day | ORAL | 0 refills | Status: AC

## 2021-06-27 NOTE — ED Triage Notes (Signed)
Pt states bumps and itching on skin x1 week, no relief with benadryl. States there is mold in bathroom she believes is the cause.

## 2021-06-27 NOTE — ED Provider Notes (Addendum)
[facility]-EMERGENCY DEPT Provider Note   CSN: [id number] Arrival date & time: 06/27/21  0350     History Chief Complaint  Patient presents with   Rash    [name] is a 30 y.o. female.  HPI  Patient presents with rash x1 week.  The rash is pruritic, she first noticed it on her chest and now it is expanding to her back.  Rash is pruritic, but not painful.  She has tried Benadryl and topical hydrocortisone cream with minimal relief.  She denies any fevers, shortness of breath, difficulty breathing, chest pain, chest tightness.  No history of allergies or asthma.  She believes it is due to mold in her apartment, this has not happened previously.  She is already been on the phone with the landlord and they have not been helpful.  She has not tried any new laundry detergents, no changes in new clothes, no changes in soaps.  Past Medical History:  Diagnosis Date   GERD (gastroesophageal reflux disease)    with pregnancy    Headache    Multiple sclerosis (HCC)     Patient Active Problem List   Diagnosis Date Noted   Multiple sclerosis (HCC) 01/10/2020   S/P repeat low transverse C-section 01/27/2018   Previous cesarean section 01/26/2018   Placental abruption 10/26/2015   Complete miscarriage 08/20/2014    Past Surgical History:  Procedure Laterality Date   ADENOIDECTOMY     CESAREAN SECTION     CESAREAN SECTION N/A 01/16/2016   Procedure: CESAREAN SECTION;  Surgeon: Richarda Overlie, MD;  Location: [facility] ORS;  Service: Obstetrics;  Laterality: N/A;  Repeat edc 01/23/16 NKDA   CESAREAN SECTION WITH BILATERAL TUBAL LIGATION Bilateral 01/27/2018   Procedure: REPEAT CESAREAN SECTION WITH BILATERAL TUBAL LIGATION;  Surgeon: Osborn Coho, MD;  Location: [facility] SU[facility]ES;  Service: Obstetrics;  Laterality: Bilateral;   TONSILLECTOMY       OB History     Gravida  4   Para  3   Term  2   Preterm  1   AB  1   Living  3      SAB      IAB   1   Ectopic      Multiple  0   Live Births  3           Family History  Problem Relation Age of Onset   Hypertension Mother    Diabetes Maternal Grandmother     Social History   Tobacco Use   Smoking status: Every Day    Packs/day: 0.25    Types: Cigarettes   Smokeless tobacco: Never  Vaping Use   Vaping Use: Never used  Substance Use Topics   Alcohol use: Yes    Comment: occasional - not during pregnancy   Drug use: No    Home Medications Prior to Admission medications   Medication Sig Start Date End Date Taking? Authorizing Provider  acetaminophen (TYLENOL) 500 MG tablet Take 1,000 mg by mouth every 6 (six) hours as needed for mild pain or headache.  Patient not taking: No sig reported    [provider]  baclofen (LIORESAL) 10 MG tablet Take 1 tablet (10 mg total) by mouth 3 (three) times daily. TAKE 1/2 TABLET BY MOUTH EVERY NIGHT AT BEDTIME FOR 7 DAYS, THEN TAKE 1/2 TABLET TWO TIMES A DAY FOR 7 DAYS, THEN TAKE 1/2 TABLET THREE TIMES A DAY FOR 7 DAYS, THEN TAKE ONE TABLET THREE  TIMES A DAY 03/02/21   Drema Dallas, DO  Cholecalciferol 100 MCG (4000 UT) CAPS Take 1 capsule (4,000 Units total) by mouth daily. Patient not taking: No sig reported 01/25/20   Drema Dallas, DO  gabapentin (NEURONTIN) 300 MG capsule Take 3 capsules (900 mg total) by mouth 3 (three) times daily. 12/19/20   Drema Dallas, DO  ibuprofen (ADVIL,MOTRIN) 800 MG tablet Take 1 tablet (800 mg total) by mouth every 8 (eight) hours. Patient not taking: No sig reported 01/30/18   Prothero, Henderson Newcomer, CNM  traMADol (ULTRAM) 50 MG tablet Take 1 tablet (50 mg total) by mouth every 6 (six) hours as needed. 01/25/21   Drema Dallas, DO    Allergies    Patient has no known allergies.  Review of Systems   Review of Systems  Constitutional:  Negative for fever.  Respiratory:  Negative for shortness of breath.   Skin:  Positive for rash.   Physical Exam Updated Vital Signs BP 123/74 (BP  Location: Left Arm)   Pulse 89   Temp 98.2 F (36.8 C) (Oral)   Resp 17   SpO2 100%   Physical Exam Vitals and nursing note reviewed. Exam conducted with a chaperone present.  Constitutional:      General: She is not in acute distress.    Appearance: Normal appearance.  HENT:     Head: Normocephalic and atraumatic.     Mouth/Throat:     Mouth: Mucous membranes are moist.     Pharynx: No posterior oropharyngeal erythema.  Eyes:     General: No scleral icterus.    Extraocular Movements: Extraocular movements intact.     Pupils: Pupils are equal, round, and reactive to light.  Cardiovascular:     Rate and Rhythm: Normal rate and regular rhythm.  Pulmonary:     Effort: Pulmonary effort is normal.     Breath sounds: Normal breath sounds.     Comments: Lungs are clear to auscultation bilaterally Skin:    Coloration: Skin is not jaundiced.     Findings: Rash present.     Comments: Erythematous papules to the back and chest, blanching.  No dermatographia.  Neurological:     Mental Status: She is alert. Mental status is at baseline.     Coordination: Coordination normal.    ED Results / Procedures / Treatments   Labs (all labs ordered are listed, but only abnormal results are displayed) Labs Reviewed - No data to display  EKG None  Radiology No results found.  Procedures Procedures   Medications Ordered in ED Medications - No data to display  ED Course  I have reviewed the triage vital signs and the nursing notes.  Pertinent labs & imaging results that were available during my care of the patient were reviewed by me and considered in my medical decision making (see chart for details).    MDM Rules/Calculators/A&P                           Physical exam reassuring, vitals are stable.  This is not anaphylaxis.  I suspect she is having a contact dermatitis versus allergic reaction. Her airway is patent, breath sounds are clear to auscultation bilaterally.  No facial  or neck swelling.  Will try 5-day course of steroids. Advised follow up in one week if no improvement. Also advised that if irritant is present in apartment it is unlikely that symptoms will improve.  Return precautions given, she is appropriate discharge at this time.  Final Clinical Impression(s) / ED Diagnoses Final diagnoses:  None    Rx / DC Orders ED Discharge Orders     None        Theron Arista, PA-C 06/27/21 0944    Theron Arista, PA-C 06/27/21 0948    Glendora Score, MD 06/27/21 2043

## 2021-06-27 NOTE — Discharge Instructions (Addendum)
Take prednisone twice daily for the next 5 days.  Take 20 mg in the morning and 20 mg at night.    you can take Claritin during the day, and Benadryl at night.  He may also apply calamine lotion as needed for the itchiness.  Please continue to talk with your landlord about resolving the mold issue in your apartment.  I would also like you to schedule a follow-up appointment in a week with your primary care doctor to make sure things have resolved.   If things change or worsening in her back to the ED as needed.

## 2021-07-03 ENCOUNTER — Encounter (HOSPITAL_COMMUNITY): Payer: BC Managed Care – PPO

## 2021-07-04 NOTE — Progress Notes (Signed)
PA started on paper unable  to do over the phone with [employer].   Paperwork filled out and faxed to [employer]

## 2021-07-18 ENCOUNTER — Telehealth: Payer: Self-pay

## 2021-07-18 NOTE — Progress Notes (Signed)
Letter received from [place], Tysabri Denied.   Spoke to [employer].  He will have the Assistance program to call the patient to get income information.

## 2021-07-18 NOTE — Telephone Encounter (Signed)
Pt states she picked up  baclofen and it looked different from the one she use to. Since then pt has had a rash spreading from her chest, Legs and face.  Per DR.Jaffe, please stop taking the medication keep one of the pills to take to the pharmacy and ask if she could get the old manufactor.  Wait as well to see if the rash goes a way to see if it was the baclofen that caused the rash.

## 2021-07-19 NOTE — Telephone Encounter (Signed)
Pt to call the office back to let us know where in [place] she will be so we find a Neurologist close to her.

## 2021-08-03 NOTE — Progress Notes (Signed)
NEUROLOGY FOLLOW UP OFFICE NOTE  TIFFANI KADOW [id number]  Assessment/Plan:   Relapsing-remitting multiple sclerosis Left-sided trigeminal neuralgia  DMT:  Tysabri Trigeminal neuralgia:  gabapentin 900mg  TID D3 7000 IU daily Check CBC with diff, CMP, vit D and JC Virus antibody titer with index today and again in 6 months Advised to go to Urgent Care regarding rash as it appears to not be related to the medications that I prescribe. Follow up 6 months.   Subjective:  . Mumford is a 30 year old right-handed black woman who follows up for multiple sclerosis and left-sided trigeminal neuralgia.  MRI from May personally reviewed.   UPDATE: Current DMT:  Tysabri Other medications:  gabapentin 900mg  TID (trigeminal neuralgia), baclofen 10mg  TID (trigeminal neuralgia), D3 7000 IU daily.  01/25/2021 LABS:  JC Virus index 0.22 with Ab inhibition negative; CBC with WBC 6.2, ALC 3.3; CMP with Na 136, K 4.2, BUN 10, Cr 0.62, T bili 0.3, ALP 36, AST 16, ALT 13; Vit D 24.45 - advised to increase D3 to 7000 IU daily.  03/14/2021 MRI BRAIN W WO:  Stable brain MRI.  No progressive or enhancing demyelination. 03/15/2021 MRI C-SPINE W WO:  No progression of cervical cord demyelination. The solitary C2-3 plaque remains nonenhancing.  Earlier this month, she received baclofen made from a different manufacturer.  When she took it, she developed a rash.  She was advised to tell her pharmacy that she cannot take the baclofen from that manufacturer.  She stopped the baclofen but she continues to have a rash.  It has spread from torso to arms and legs and itches  Vision:  No issues Motor:  No issues Sensory:  Sometimes has numbness and tingling in hands and feet Pain:  Left-sided trigeminal neuralgia.  It was okay for a while but started back again on Friday.  Now it occurs 3 times a day for 20 minutes. 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     HISTORY: She presented to [facility] ED on 01/10/2020 for evaluation of numbness and tingling of the left side of her body involving arm, torso and leg but not her face.  She also endorsed some mild left arm weakness as well.  Symptoms started 4 days prior to evaluation. MRI of brain and cervical spine with and without contrast showed a 12 mm hyperintense T2/FLAIR focus within the left medial parietal lobe as well as two other foci within the frontoparietal vertex subcortical white matter.  Contrast enhancement noted in the medial parietal lesion.  An enhancing lesion was also noted within the right side of the cervical cord at the C2-C3 levels.  Hospital admission for acute treatment was recommended but patient left against medical advice.  Since then, she has developed intermittent blurred vision and pressure behind her eyes.  Sometimes her right foot feels numb.  She has pain in her left hand, which makes lifting objects difficult.  On occasion, she reports word-finding difficulty.  Seen by me in initial consultation in March 2021.  Due to ongoing symptoms including new visual disturbance, she underwent another round of SoluMedrol 1g daily for 3 days.  Given the severity of her symptoms and active cervical cord lesion on initial clinical flare up,  it was decided to start with Tysabri..  She saw ophthalmology in April.  Exam demonstrated no optic neurlitis.  However, she never started Tysabri as Medicaid would not approve it.  Thus, she has had a physical decline off of DMT.  She reported worsening bilateral numbness in hands and feet in late April 2021.  She was seen in the ED on 5/8 where she was given a course of high-dose prednisone.    No family history of MS.   01/21/2020 LABS:  JC Virus Index 0.22, antibody by inhibition  negatve; Quantiferon-TB Gold negative; Hep B negative; vitamin D 9; NMO IgG negative   Imaging: 01/10/2020 MRI BRAIN/CERVICAL SPINE W/WO:  12 mm hyperintense T2/FLAIR focus within the left medial parietal lobe as well as two other foci within the frontoparietal vertex subcortical white matter.  Contrast enhancement noted in the medial parietal lesion.  An enhancing lesion was also noted within the right side of the cervical cord at the C2-C3 levels. 08/19/2020 MRI BRAIN W/WO:  new T2 hyperintense lesion within the pontomedullary junction without contrast enhancement as well as previous T2 focus within the left centrum semiovale/posterior frontal region no longer with contrast enhancement, and stable juxtacortical T2 lesions in the left frontoparietal vertex, as well as partial empty sella.  08/19/2020 MRI CERVICAL SPINE W/WO:   significant decrease in size of T2 lesion at C2-C3 level on right without enhancement and no new lesions.   08/22/2020 MRI THORACIC SPINE W/WO:  Unremarkable.   Past medications:  Tegretol (rash)  PAST MEDICAL HISTORY: Past Medical History:  Diagnosis Date   GERD (gastroesophageal reflux disease)    with pregnancy    Headache    Multiple sclerosis (HCC)     MEDICATIONS: Current Outpatient Medications on File Prior to Visit  Medication Sig Dispense Refill   acetaminophen (TYLENOL) 500 MG tablet Take 1,000 mg by mouth every 6 (six) hours as needed for mild pain or headache.  (Patient not taking: No sig reported)     baclofen (LIORESAL) 10 MG tablet Take 1 tablet (10 mg total) by mouth 3 (three) times daily. TAKE 1/2 TABLET BY MOUTH EVERY NIGHT AT BEDTIME FOR 7 DAYS, THEN TAKE 1/2 TABLET TWO TIMES A DAY FOR 7 DAYS, THEN TAKE 1/2 TABLET THREE TIMES A DAY FOR 7 DAYS, THEN TAKE ONE TABLET THREE TIMES A DAY 90 tablet 5   Cholecalciferol 100 MCG (4000 UT) CAPS Take 1 capsule (4,000 Units total) by mouth daily. (Patient not taking: No sig reported) 30 capsule    gabapentin  (NEURONTIN) 300 MG capsule TAKE THREE CAPSULES BY MOUTH THREE TIMES A DAY 270 capsule 1   ibuprofen (ADVIL,MOTRIN) 800 MG tablet Take 1 tablet (800 mg total) by mouth every 8 (eight) hours. (Patient not taking: No sig reported) 30 tablet 0   predniSONE (DELTASONE) 10 MG tablet Take 2 tablets (20 mg total) by mouth daily. 20 tablet 0   traMADol (ULTRAM) 50 MG tablet Take 1 tablet (50 mg total) by mouth every 6 (six) hours as needed. 14 tablet 0   Current Facility-Administered Medications on File Prior to Visit  Medication Dose Route Frequency Provider Last Rate Last Admin   acetaminophen (TYLENOL) CR tablet 650 mg  650 mg Oral Once Shon Millet R, DO       loratadine (CLARITIN) tablet 10 mg  10 mg Oral Once Drema Dallas, DO        ALLERGIES: No Known Allergies  FAMILY HISTORY: Family History  Problem Relation Age  of Onset   Hypertension Mother    Diabetes Maternal Grandmother       Objective:  Blood pressure (!) 124/94, pulse 88, height 5\' 5"  (1.651 m), weight 151 lb 12.8 oz (68.9 kg), SpO2 100 %, unknown if currently breastfeeding. General: No acute distress.  Patient appears well-groomed.   Head:  Normocephalic/atraumatic Eyes:  Fundi examined but not visualized Neck: supple, no paraspinal tenderness, full range of motion Heart:  Regular rate and rhythm Lungs:  Clear to auscultation bilaterally Back: No paraspinal tenderness Neurological Exam: alert and oriented to person, place, and time.  Speech fluent and not dysarthric, language intact.  CN II-XII intact. Bulk and tone normal, muscle strength 5/5 throughout.  Sensation to light touch intact.  Deep tendon reflexes 2+ throughout, toes downgoing.  Finger to nose testing intact.  Gait normal, Romberg negative.   , DO

## 2021-08-04 ENCOUNTER — Ambulatory Visit: Payer: BC Managed Care – PPO | Admitting: Neurology

## 2021-08-04 ENCOUNTER — Other Ambulatory Visit: Payer: Self-pay

## 2021-08-04 ENCOUNTER — Other Ambulatory Visit: Payer: BC Managed Care – PPO

## 2021-08-04 VITALS — BP 124/94 | HR 88 | Ht 65.0 in | Wt 151.8 lb

## 2021-08-04 DIAGNOSIS — G35 Multiple sclerosis: Secondary | ICD-10-CM

## 2021-08-04 DIAGNOSIS — R21 Rash and other nonspecific skin eruption: Secondary | ICD-10-CM | POA: Diagnosis not present

## 2021-08-04 DIAGNOSIS — G500 Trigeminal neuralgia: Secondary | ICD-10-CM

## 2021-08-04 LAB — COMPREHENSIVE METABOLIC PANEL
ALT: 10 U/L (ref 0–35)
AST: 14 U/L (ref 0–37)
Albumin: 4.4 g/dL (ref 3.5–5.2)
Alkaline Phosphatase: 41 U/L (ref 39–117)
BUN: 8 mg/dL (ref 6–23)
CO2: 28 mEq/L (ref 19–32)
Calcium: 9.6 mg/dL (ref 8.4–10.5)
Chloride: 106 mEq/L (ref 96–112)
Creatinine, Ser: 0.78 mg/dL (ref 0.40–1.20)
GFR: 101.83 mL/min (ref >60.00)
Glucose, Bld: 75 mg/dL (ref 70–99)
Potassium: 4 mEq/L (ref 3.5–5.1)
Sodium: 140 mEq/L (ref 135–145)
Total Bilirubin: 0.2 mg/dL (ref 0.2–1.2)
Total Protein: 7.2 g/dL (ref 6.0–8.3)

## 2021-08-04 LAB — CBC WITH DIFFERENTIAL/PLATELET
Basophils Absolute: 0 10*3/uL (ref 0.0–0.1)
Basophils Relative: 1.1 % (ref 0.0–3.0)
Eosinophils Absolute: 0 10*3/uL (ref 0.0–0.7)
Eosinophils Relative: 0.8 % (ref 0.0–5.0)
HCT: 35.2 % — ABNORMAL LOW (ref 36.0–46.0)
Hemoglobin: 11.7 g/dL — ABNORMAL LOW (ref 12.0–15.0)
Lymphocytes Relative: 46.9 % — ABNORMAL HIGH (ref 12.0–46.0)
Lymphs Abs: 2.2 10*3/uL (ref 0.7–4.0)
MCHC: 33.2 g/dL (ref 30.0–36.0)
MCV: 86.4 fl (ref 78.0–100.0)
Monocytes Absolute: 0.3 10*3/uL (ref 0.1–1.0)
Monocytes Relative: 6 % (ref 3.0–12.0)
Neutro Abs: 2.1 10*3/uL (ref 1.4–7.7)
Neutrophils Relative %: 45.2 % (ref 43.0–77.0)
Platelets: 301 10*3/uL (ref 150.0–400.0)
RBC: 4.07 Mil/uL (ref 3.87–5.11)
RDW: 14.2 % (ref 11.5–15.5)
WBC: 4.7 10*3/uL (ref 4.0–10.5)

## 2021-08-04 LAB — VITAMIN D 25 HYDROXY (VIT D DEFICIENCY, FRACTURES): VITD: 19.59 ng/mL — ABNORMAL LOW (ref 30.00–100.00)

## 2021-08-04 NOTE — Patient Instructions (Signed)
Check CBC with diff, CMP, vit D and JC Virus antibody titer with index today and again in 6 months Continue Tysabri Continue gabapentin 900mg  three times daily Continue D3 7000 IU daily Go to Urgent Care regarding rash Follow up 6 months.

## 2021-08-06 ENCOUNTER — Encounter: Payer: Self-pay | Admitting: Neurology

## 2021-08-11 ENCOUNTER — Telehealth: Payer: Self-pay | Admitting: Pharmacy Technician

## 2021-08-11 NOTE — Telephone Encounter (Signed)
REPLACEMENT MEDICATION: TYSABRI  Requested replacement drug for service date 04/24/21 and 06/05/21.   Case Manager: Crystal Phone: [phone] Will f/u with response

## 2021-08-13 LAB — STRATIFY JCV AB (W/ INDEX) W/ RFLX
Index Value: 0.35 — ABNORMAL HIGH
Stratify JCV (TM) Ab w/Reflex Inhibition: UNDETERMINED — AB

## 2021-08-13 LAB — RFLX STRATIFY JCV (TM) AB INHIBITION: JCV Antibody by Inhibition: NEGATIVE

## 2021-08-15 NOTE — Progress Notes (Signed)
Pt advised of labs results. 

## 2021-08-17 ENCOUNTER — Telehealth: Payer: Self-pay

## 2021-08-17 NOTE — Telephone Encounter (Signed)
New message   F/u    Received fax from [employer] prescribing program medication Tysabri   Authorization is valid from 08/15/2021 to 02/14/2022.

## 2021-08-17 NOTE — Telephone Encounter (Deleted)
F/u   Received fax from [employer] prescribing program medication Tysabri  Authorization is valid from 08/15/2021 to 02/14/2022.

## 2021-08-19 ENCOUNTER — Other Ambulatory Visit: Payer: Self-pay | Admitting: Neurology

## 2021-08-21 NOTE — Telephone Encounter (Signed)
Follow-up:  REPLACEMENT MEDICATION - TYSABRI Still pending. Rep: Marchelle Folks [phone]

## 2021-08-25 ENCOUNTER — Other Ambulatory Visit: Payer: Self-pay

## 2021-08-28 NOTE — Telephone Encounter (Signed)
Fyi note:  Tysabri medication replacement: still pending

## 2021-09-11 ENCOUNTER — Telehealth: Payer: Self-pay | Admitting: Neurology

## 2021-09-11 ENCOUNTER — Other Ambulatory Visit: Payer: Self-pay | Admitting: Neurology

## 2021-09-11 MED ORDER — TRAMADOL HCL 50 MG PO TABS: 50.0000 mg | ORAL_TABLET | Freq: Four times a day (QID) | ORAL | 1 refills | Status: DC | PRN

## 2021-09-11 MED ORDER — GABAPENTIN 300 MG PO CAPS: ORAL_CAPSULE | ORAL | 3 refills | Status: AC

## 2021-09-11 NOTE — Telephone Encounter (Signed)
Patient called and said she is having an increase in her pain related to her trigeminal neuralgia.   She'd like the pain medicine Dr. Everlena Cooper previously prescribed for her sent in again.  Karin Golden on [id number] [facility] in [place]

## 2021-09-13 ENCOUNTER — Other Ambulatory Visit: Payer: Self-pay | Admitting: Neurology

## 2021-09-13 ENCOUNTER — Telehealth: Payer: Self-pay | Admitting: Neurology

## 2021-09-13 MED ORDER — TRAMADOL HCL 50 MG PO TABS: 50.0000 mg | ORAL_TABLET | Freq: Four times a day (QID) | ORAL | 1 refills | Status: DC | PRN

## 2021-09-13 NOTE — Telephone Encounter (Signed)
Pt needs the new RX for tramadol 50mg  sent to [facility] in [place]. It was sent to [place], and pt doesn't live in [place] anymore.   Pharmacy numb [phone]

## 2021-09-14 NOTE — Telephone Encounter (Signed)
F/u fyi note Per case manager Crystal, case is still pending.

## 2021-09-18 ENCOUNTER — Telehealth: Payer: Self-pay | Admitting: Neurology

## 2021-09-18 MED ORDER — LAMOTRIGINE 25 MG PO TABS: ORAL_TABLET | ORAL | 3 refills | Status: AC

## 2021-09-18 NOTE — Telephone Encounter (Signed)
Pt conf she is taking all the meds prescribed. New meds can be sent to [facility] in [place], not [place]

## 2021-09-18 NOTE — Telephone Encounter (Signed)
Tried calling pt, no answer to go over Medication instructions.

## 2021-09-18 NOTE — Telephone Encounter (Signed)
Pt said she needs a call to discuss her next step for her neuralgia. Has developed pain in her jaw this weekend, going towards her temple.  Tramadol doesnt seem to help. Pain lasted 30 min at a time.

## 2021-09-18 NOTE — Telephone Encounter (Signed)
LMOVM for pt, First, please verify that she is taking the following:  Gabapentin 900mg  three times daily  Baclofen 10mg  three times daily  Tramadol as needed/directed   If so, then we can start lamotrigine 25mg  tablet - take 25mg  at bedtime for 14 days, then increase to 25mg  twice daily.  If she develops a new or unusual rash, contact .

## 2021-10-02 ENCOUNTER — Other Ambulatory Visit: Payer: Self-pay

## 2021-10-02 NOTE — Progress Notes (Unsigned)
Referral faxed over to [facility] per pt and Crystal wit [employer].

## 2021-10-09 ENCOUNTER — Telehealth: Payer: Self-pay | Admitting: Neurology

## 2021-10-09 NOTE — Telephone Encounter (Signed)
LMOVm for pt. Please seek care with your PCP.

## 2021-10-10 NOTE — Telephone Encounter (Signed)
F/u: Still pending per [employer] [phone]

## 2021-11-08 ENCOUNTER — Other Ambulatory Visit: Payer: Self-pay

## 2021-11-08 MED ORDER — TYSABRI 300 MG/15ML IV CONC: 300.0000 mg | INTRAVENOUS | 11 refills | Status: AC

## 2021-11-08 NOTE — Progress Notes (Signed)
Per [employer] need order for Tysabri.  Order added and faxed to [phone]

## 2021-11-17 ENCOUNTER — Other Ambulatory Visit: Payer: Self-pay

## 2021-11-17 ENCOUNTER — Telehealth: Payer: Self-pay | Admitting: Neurology

## 2021-11-17 MED ORDER — TRAMADOL HCL 50 MG PO TABS: 50.0000 mg | ORAL_TABLET | Freq: Four times a day (QID) | ORAL | 1 refills | Status: AC | PRN

## 2021-11-17 NOTE — Telephone Encounter (Signed)
Patient said she needs a refill on her tramadol for her pain, she cant eat she is hurting so bad.

## 2021-11-17 NOTE — Telephone Encounter (Signed)
Refill sent for pt

## 2021-11-22 ENCOUNTER — Encounter: Payer: Self-pay | Admitting: Neurology

## 2021-12-27 ENCOUNTER — Telehealth: Payer: Self-pay | Admitting: Neurology

## 2021-12-27 NOTE — Telephone Encounter (Signed)
The following message was left with [employer] on 12/27/21 at 12:48 PM.  Caller states the patient has not infused since June 05, 2021.  She will be un-enrolled from the [employer] program.

## 2021-12-28 NOTE — Telephone Encounter (Signed)
Advised [employer] to advised pt not longer a patient here. Pt had records faxed over to a provider in [place].

## 2022-01-25 ENCOUNTER — Telehealth: Payer: Self-pay | Admitting: Neurology

## 2022-01-25 NOTE — Telephone Encounter (Signed)
Form  sign and refaxed.  ?

## 2022-01-25 NOTE — Telephone Encounter (Signed)
Marcelino Duster called from [employer] and stated the fax was sent was not signed . The fax number is [phone]. ?

## 2022-02-18 ENCOUNTER — Other Ambulatory Visit: Payer: Self-pay | Admitting: Neurology

## 2022-02-22 NOTE — Progress Notes (Deleted)
? ?NEUROLOGY FOLLOW UP OFFICE NOTE ? ?[name] ?[id number] ? ?Assessment/Plan:  ? ?Relapsing-remitting multiple sclerosis ?Left-sided trigeminal neuralgia ?  ?DMT:  Tysabri ?Trigeminal neuralgia:  gabapentin 900mg  TID ?D3 7000 IU daily ?Check CBC with diff, CMP, vit D and JC Virus antibody titer with index today and again in 6 months ?Advised to go to Urgent Care regarding rash as it appears to not be related to the medications that I prescribe. ?Follow up 6 months. ?  ?  ?Subjective:  ? . Hamilton is a 31 year old right-handed black woman who follows up for multiple sclerosis and left-sided trigeminal neuralgia.  ?  ?UPDATE: ?Current DMT:  Tysabri ?Other medications:  gabapentin 900mg  TID (trigeminal neuralgia), baclofen 10mg  TID (trigeminal neuralgia), D3 7000 IU daily. ?  ?Labs: ?10/06/2021: CBC with WBC 5.6, HGB 11.3, HCT 34.5, PLT 240, MCV 87, ALC 1.5; CMP with Na 138, K 3.3, Cl 102, CO2 25, glucose 79, BUN 7, Cr 0.54, t bili <0.15, ALP 51, AST 18, ALT 11 ?  ?*** ?  ?Vision:  No issues ?Motor:  No issues ?Sensory:  Sometimes has numbness and tingling in hands and feet ?Pain:  Left-sided trigeminal neuralgia.  It was okay for a while but started back again on Friday.  Now it occurs 3 times a day for 20 minutes. 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   ?  ?HISTORY: ?She [facility] 01[facility]10[facility]2020 for evaluation of numbness and tingling of the left side of her body involving arm, torso and leg but not her face.  She also endorsed some mild left arm weakness as well.  Symptoms started 4 days prior to evaluation. MRI of brain and cervical spine with and without contrast showed a 12 mm hyperintense T2/FLAIR focus within the left medial parietal lobe as well as two other  foci within the frontoparietal vertex subcortical white matter.  Contrast enhancement noted in the medial parietal lesion.  An enhancing lesion was also noted within the right side of the cervical cord at the C2-C3 levels.  Hospital admission for acute treatment was recommended but patient left against medical advice.  Since then, she has developed intermittent blurred vision and pressure behind her eyes.  Sometimes her right foot feels numb.  She has pain in her left hand, which makes lifting objects difficult.  On occasion, she reports word-finding difficulty.  Seen by me in initial consultation in March 2021.  Due to ongoing symptoms including new visual disturbance, she underwent another round of SoluMedrol 1g daily for 3 days.  Given the severity of her symptoms and active cervical cord lesion on initial clinical flare up, it was decided to start with Tysabri..  She saw ophthalmology in April.  Exam demonstrated no optic neurlitis.  However, she never started Tysabri as Medicaid would not approve it.  Thus, she has had a physical decline off of DMT.  She reported worsening bilateral numbness in hands and feet in late April 2021.  She was seen in the ED on 5/8 where she was given a course of high-dose prednisone.  ?  ?No family history of MS. ?  ?01/21/2020 LABS:  JC Virus Index 0.22, antibody by inhibition negatve; Quantiferon-TB Gold negative; Hep B  negative; vitamin D 9; NMO IgG negative ?  ?Imaging: ?01/10/2020 MRI BRAIN/CERVICAL SPINE W/WO:  12 mm hyperintense T2/FLAIR focus within the left medial parietal lobe as well as two other foci within the frontoparietal vertex subcortical white matter.  Contrast enhancement noted in the medial parietal lesion.  An enhancing lesion was also noted within the right side of the cervical cord at the C2-C3 levels. ?08/19/2020 MRI BRAIN W/WO:  new T2 hyperintense lesion within the pontomedullary junction without contrast enhancement as well as previous T2 focus within the  left centrum semiovale/posterior frontal region no longer with contrast enhancement, and stable juxtacortical T2 lesions in the left frontoparietal vertex, as well as partial empty sella.  ?08/19/2020 MRI CERVICAL SPINE W/WO:   significant decrease in size of T2 lesion at C2-C3 level on right without enhancement and no new lesions.   ?08/22/2020 MRI THORACIC SPINE W/WO:  Unremarkable. ?03/14/2021 MRI BRAIN W WO:  Stable brain MRI.  No progressive or enhancing demyelination. ?03/15/2021 MRI C-SPINE W WO:  No progression of cervical cord demyelination. The solitary C2-3 ?plaque remains nonenhancing. ?  ?Past medications:  Tegretol (rash ? ?PAST MEDICAL HISTORY: ?Past Medical History:  ?Diagnosis Date  ? GERD (gastroesophageal reflux disease)   ? with pregnancy   ? Headache   ? Multiple sclerosis (HCC)   ? ? ?MEDICATIONS: ?Current Outpatient Medications on File Prior to Visit  ?Medication Sig Dispense Refill  ? acetaminophen (TYLENOL) 500 MG tablet Take 1,000 mg by mouth every 6 (six) hours as needed for mild pain or headache.  (Patient not taking: No sig reported)    ? baclofen (LIORESAL) 10 MG tablet TAKE 1/2 TABLET BY MOUTH EVERY NIGHT AT BEDTIME FOR 7 DAYS, THEN 1/2 TABLET TWO TIMES A DAY FOR 7 DAYS, THEN 1/2 TABLET THREE TIMES A DAY FOR 7 DAYS, THEN 1 TABLET THREE TIMES A DAY 90 tablet 5  ? Cholecalciferol 100 MCG (4000 UT) CAPS Take 1 capsule (4,000 Units total) by mouth daily. (Patient not taking: No sig reported) 30 capsule   ? gabapentin (NEURONTIN) 300 MG capsule TAKE THREE CAPSULES BY MOUTH THREE TIMES A DAY 270 capsule 3  ? ibuprofen (ADVIL,MOTRIN) 800 MG tablet Take 1 tablet (800 mg total) by mouth every 8 (eight) hours. 30 tablet 0  ? lamoTRIgine (LAMICTAL) 25 MG tablet take 25mg  at bedtime for 14 days, then increase to 25mg  twice daily.  If she develops a new or unusual rash, contact 60 tablet 3  ? natalizumab (TYSABRI) 300 MG/15ML injection Inject 15 mLs (300 mg total) into the vein every 28  (twenty-eight) days. 15 mL 11  ? predniSONE (DELTASONE) 10 MG tablet Take 2 tablets (20 mg total) by mouth daily. 20 tablet 0  ? traMADol (ULTRAM) 50 MG tablet Take 1 tablet (50 mg total) by mouth every 6 (six) hours as needed. 30 tablet 1  ? ?Current Facility-Administered Medications on File Prior to Visit  ?Medication Dose Route Frequency Provider Last Rate Last Admin  ? acetaminophen (TYLENOL) CR tablet 650 mg  650 mg Oral Once , DO      ? loratadine (CLARITIN) tablet 10 mg  10 mg Oral Once [place] R, DO      ? ? ?ALLERGIES: ?No Known Allergies ? ?FAMILY HISTORY: ?Family History  ?Problem Relation Age of Onset  ? Hypertension Mother   ? Diabetes Maternal Grandmother   ? ? ?  ?Objective:  ?*** ?General: No acute distress.  Patient appears ***-groomed.   ?  Head:  Normocephalic/atraumatic ?Eyes:  Fundi examined but not visualized ?Neck: supple, no paraspinal tenderness, full range of motion ?Heart:  Regular rate and rhythm ?Lungs:  Clear to auscultation bilaterally ?Back: No paraspinal tenderness ?Neurological Exam: alert and oriented to person, place, and time.  Speech fluent and not dysarthric, language intact.  CN II-XII intact. Bulk and tone normal, muscle strength 5/5 throughout.  Sensation to light touch intact.  Deep tendon reflexes 2+ throughout, toes downgoing.  Finger to nose testing intact.  Gait normal, Romberg negative. ? ? ?Shon Millet, DO ? ?CC: *** ? ? ? ? ? ? ?

## 2022-02-23 ENCOUNTER — Encounter: Payer: Self-pay | Admitting: Neurology

## 2022-02-23 ENCOUNTER — Ambulatory Visit: Payer: BC Managed Care – PPO | Admitting: Neurology

## 2022-02-23 DIAGNOSIS — Z029 Encounter for administrative examinations, unspecified: Secondary | ICD-10-CM

## 2022-03-02 ENCOUNTER — Ambulatory Visit: Payer: BC Managed Care – PPO | Admitting: Neurology

## 2022-08-08 ENCOUNTER — Telehealth: Payer: Self-pay | Admitting: Neurology

## 2022-08-08 NOTE — Telephone Encounter (Signed)
Tried calling [employer] back. No answer.

## 2022-08-08 NOTE — Telephone Encounter (Signed)
Access nurse 08-08-22 at 12:49 pm   Sharyn Lull from [facility] is calling to see if questionnaire from can by the doctor in its entirety  regarding a former patient
